# Patient Record
Sex: Female | Born: 1945
Health system: Southern US, Community
[De-identification: ages and names within clinical notes are randomized; demographics above are authoritative.]

## PROBLEM LIST (undated history)

## (undated) DIAGNOSIS — B1920 Unspecified viral hepatitis C without hepatic coma: Secondary | ICD-10-CM

## (undated) DIAGNOSIS — R7303 Prediabetes: Secondary | ICD-10-CM

## (undated) DIAGNOSIS — K259 Gastric ulcer, unspecified as acute or chronic, without hemorrhage or perforation: Secondary | ICD-10-CM

## (undated) DIAGNOSIS — I10 Essential (primary) hypertension: Secondary | ICD-10-CM

## (undated) DIAGNOSIS — E669 Obesity, unspecified: Secondary | ICD-10-CM

## (undated) DIAGNOSIS — E785 Hyperlipidemia, unspecified: Secondary | ICD-10-CM

## (undated) DIAGNOSIS — Z720 Tobacco use: Secondary | ICD-10-CM

## (undated) DIAGNOSIS — E559 Vitamin D deficiency, unspecified: Secondary | ICD-10-CM

## (undated) DIAGNOSIS — M419 Scoliosis, unspecified: Secondary | ICD-10-CM

## (undated) HISTORY — DX: Obesity, unspecified: E66.9

## (undated) HISTORY — DX: Scoliosis, unspecified: M41.9

## (undated) HISTORY — DX: Essential (primary) hypertension: I10

## (undated) HISTORY — PX: CATARACT EXTRACTION: SUR2

## (undated) HISTORY — DX: Vitamin D deficiency, unspecified: E55.9

## (undated) HISTORY — PX: BREAST RECONSTRUCTION: SHX9

## (undated) HISTORY — DX: Unspecified viral hepatitis C without hepatic coma: B19.20

## (undated) HISTORY — DX: Prediabetes: R73.03

## (undated) HISTORY — DX: Hyperlipidemia, unspecified: E78.5

## (undated) HISTORY — DX: Gastric ulcer, unspecified as acute or chronic, without hemorrhage or perforation: K25.9

## (undated) HISTORY — DX: Tobacco use: Z72.0

---

## 1984-03-06 HISTORY — PX: BACK SURGERY: SHX140

## 1997-09-09 ENCOUNTER — Other Ambulatory Visit: Admission: RE | Admit: 1997-09-09 | Discharge: 1997-09-09 | Payer: Self-pay | Admitting: *Deleted

## 2000-01-31 ENCOUNTER — Ambulatory Visit (HOSPITAL_COMMUNITY): Admission: RE | Admit: 2000-01-31 | Discharge: 2000-01-31 | Payer: Self-pay | Admitting: Gastroenterology

## 2001-01-10 ENCOUNTER — Ambulatory Visit (HOSPITAL_COMMUNITY): Admission: RE | Admit: 2001-01-10 | Discharge: 2001-01-10 | Payer: Self-pay | Admitting: Gastroenterology

## 2001-04-03 ENCOUNTER — Other Ambulatory Visit: Admission: RE | Admit: 2001-04-03 | Discharge: 2001-04-03 | Payer: Self-pay | Admitting: Gynecology

## 2002-04-09 ENCOUNTER — Other Ambulatory Visit: Admission: RE | Admit: 2002-04-09 | Discharge: 2002-04-09 | Payer: Self-pay | Admitting: Gynecology

## 2002-09-14 ENCOUNTER — Emergency Department (HOSPITAL_COMMUNITY): Admission: EM | Admit: 2002-09-14 | Discharge: 2002-09-14 | Payer: Self-pay | Admitting: Psychology

## 2005-07-04 ENCOUNTER — Other Ambulatory Visit: Admission: RE | Admit: 2005-07-04 | Discharge: 2005-07-04 | Payer: Self-pay | Admitting: Gynecology

## 2006-04-25 ENCOUNTER — Ambulatory Visit (HOSPITAL_COMMUNITY): Admission: RE | Admit: 2006-04-25 | Discharge: 2006-04-25 | Payer: Self-pay | Admitting: Internal Medicine

## 2010-12-26 ENCOUNTER — Ambulatory Visit (HOSPITAL_COMMUNITY)
Admission: RE | Admit: 2010-12-26 | Discharge: 2010-12-26 | Disposition: A | Discharge status: Home / Self Care | Payer: Medicare Other | Source: Ambulatory Visit | Attending: Internal Medicine | Admitting: Internal Medicine

## 2010-12-26 ENCOUNTER — Other Ambulatory Visit (HOSPITAL_COMMUNITY): Payer: Self-pay | Admitting: Internal Medicine

## 2010-12-26 DIAGNOSIS — Z0000 Encounter for general adult medical examination without abnormal findings: Secondary | ICD-10-CM | POA: Insufficient documentation

## 2010-12-26 DIAGNOSIS — I10 Essential (primary) hypertension: Secondary | ICD-10-CM

## 2011-05-26 ENCOUNTER — Ambulatory Visit (HOSPITAL_COMMUNITY)
Admission: RE | Admit: 2011-05-26 | Discharge: 2011-05-26 | Disposition: A | Discharge status: Home / Self Care | Payer: Medicare Other | Source: Ambulatory Visit | Attending: Internal Medicine | Admitting: Internal Medicine

## 2011-05-26 ENCOUNTER — Other Ambulatory Visit (HOSPITAL_COMMUNITY): Payer: Self-pay | Admitting: Internal Medicine

## 2011-05-26 DIAGNOSIS — M25519 Pain in unspecified shoulder: Secondary | ICD-10-CM

## 2011-08-22 ENCOUNTER — Other Ambulatory Visit (HOSPITAL_COMMUNITY): Payer: Self-pay | Admitting: Internal Medicine

## 2011-08-22 ENCOUNTER — Ambulatory Visit (HOSPITAL_COMMUNITY)
Admission: RE | Admit: 2011-08-22 | Discharge: 2011-08-22 | Disposition: A | Discharge status: Home / Self Care | Payer: Medicare Other | Source: Ambulatory Visit | Attending: Internal Medicine | Admitting: Internal Medicine

## 2011-08-22 DIAGNOSIS — I10 Essential (primary) hypertension: Secondary | ICD-10-CM | POA: Insufficient documentation

## 2011-08-22 DIAGNOSIS — R0602 Shortness of breath: Secondary | ICD-10-CM

## 2011-08-29 ENCOUNTER — Encounter: Payer: Self-pay | Admitting: Cardiovascular Disease

## 2011-08-29 ENCOUNTER — Ambulatory Visit (INDEPENDENT_AMBULATORY_CARE_PROVIDER_SITE_OTHER): Payer: Medicare Other | Admitting: Cardiovascular Disease

## 2011-08-29 VITALS — BP 163/88 | HR 66 | Ht 65.0 in | Wt 222.0 lb

## 2011-08-29 DIAGNOSIS — R0600 Dyspnea, unspecified: Secondary | ICD-10-CM

## 2011-08-29 DIAGNOSIS — R0989 Other specified symptoms and signs involving the circulatory and respiratory systems: Secondary | ICD-10-CM

## 2011-08-29 NOTE — Assessment & Plan Note (Signed)
She has exertional dyspnea. She has no chest pain. Her risk factors for CAD include HTN, HLD, age. Will arrange echo to assess LV size and function and exercise treadmill stress test to assess exercise tolerance and exclude ischemia.

## 2011-08-29 NOTE — Progress Notes (Signed)
   History of Present Illness: 66 yo WF with history of HTN, HLD and former tobacco abuse who is here today for evaluation of dyspnea with exertion. She is active. She has had no chest pain. She is getting dyspneic with exertion such as climbing stairs. Smoked 1ppd for 30 years, quit 6 years ago. No dizziness, near syncope or syncope. No prior cardiac issues.   Primary Care Physician: Dr. Oneta Rack  Past Medical History  Diagnosis Date  . HTN (hypertension)   . Hyperlipidemia   . Scoliosis   . Hepatitis C     Past Surgical History  Procedure Date  . Back surgery 1986    x 3  . Breast reconstruction     x 2  . Cataract extraction     Bilateral    Current Outpatient Prescriptions  Medication Sig Dispense Refill  . bisoprolol-hydrochlorothiazide (ZIAC) 5-6.25 MG per tablet Take 1 tablet by mouth daily.      . Cholecalciferol (VITAMIN D PO) Take by mouth. TAKE 5 000 UNITSDAILY      . ezetimibe (ZETIA) 10 MG tablet Take 10 mg by mouth daily.      . Magnesium Oxide (MAG-CAPS PO) Take by mouth. 400 MG DAILY      . olmesartan-hydrochlorothiazide (BENICAR HCT) 40-25 MG per tablet TAKE 1/2 TAB DAILY      . pravastatin (PRAVACHOL) 40 MG tablet Take 40 mg by mouth daily.        No Known Allergies  History   Social History  . Marital Status: Married    Spouse Name: N/A    Number of Children: 1  . Years of Education: N/A   Occupational History  . Retired [employer]    Social History Main Topics  . Smoking status: Former Smoker -- 1.0 packs/day for 30 years    Types: Cigarettes    Quit date: 08/28/2004  . Smokeless tobacco: Not on file   Comment: QUIT SIX YEARS AGO  . Alcohol Use: 1.0 oz/week    2 drink(s) per week  . Drug Use: No  . Sexually Active: Not on file   Other Topics Concern  . Not on file   Social History Narrative  . No narrative on file    Family History  Problem Relation Age of Onset  . Coronary artery disease Brother     Review of Systems:   As stated in the HPI and otherwise negative.   BP 163/88  Pulse 66  Ht 5\' 5"  (1.651 m)  Wt 222 lb (100.699 kg)  BMI 36.94 kg/m2  Physical Examination: General: Well developed, well nourished, NAD HEENT: OP clear, mucus membranes moist SKIN: warm, dry. No rashes. Neuro: No focal deficits Musculoskeletal: Muscle strength 5/5 all ext Psychiatric: Mood and affect normal Neck: No JVD, no carotid bruits, no thyromegaly, no lymphadenopathy. Lungs:Clear bilaterally, no wheezes, rhonci, crackles Cardiovascular: Regular rate and rhythm. No murmurs, gallops or rubs. Abdomen:Soft. Bowel sounds present. Non-tender.  Extremities: No lower extremity edema. Pulses are 2 + in the bilateral DP/PT.  EKG: Deferred. Pt had normal recent EKG in primary care and does not wish to repeat. Reported as normal in records.

## 2011-08-29 NOTE — Patient Instructions (Signed)
Your physician recommends that you schedule a follow-up appointment in: 6 weeks.   Your physician has requested that you have an exercise tolerance test. For further information please visit [email or web address] Please also follow instruction sheet, as given.   Your physician has requested that you have an echocardiogram. Echocardiography is a painless test that uses sound waves to create images of your heart. It provides your doctor with information about the size and shape of your heart and how well your heart's chambers and valves are working. This procedure takes approximately one hour. There are no restrictions for this procedure.

## 2011-09-05 ENCOUNTER — Ambulatory Visit (HOSPITAL_COMMUNITY): Payer: Medicare Other | Attending: Cardiovascular Disease | Admitting: Radiology

## 2011-09-05 ENCOUNTER — Encounter: Payer: Self-pay | Admitting: Cardiovascular Disease

## 2011-09-05 DIAGNOSIS — R0989 Other specified symptoms and signs involving the circulatory and respiratory systems: Secondary | ICD-10-CM | POA: Insufficient documentation

## 2011-09-05 DIAGNOSIS — R0609 Other forms of dyspnea: Secondary | ICD-10-CM | POA: Insufficient documentation

## 2011-09-05 DIAGNOSIS — Z8249 Family history of ischemic heart disease and other diseases of the circulatory system: Secondary | ICD-10-CM | POA: Insufficient documentation

## 2011-09-05 DIAGNOSIS — I517 Cardiomegaly: Secondary | ICD-10-CM | POA: Insufficient documentation

## 2011-09-05 DIAGNOSIS — I359 Nonrheumatic aortic valve disorder, unspecified: Secondary | ICD-10-CM | POA: Insufficient documentation

## 2011-09-05 DIAGNOSIS — R0600 Dyspnea, unspecified: Secondary | ICD-10-CM

## 2011-09-05 DIAGNOSIS — Z87891 Personal history of nicotine dependence: Secondary | ICD-10-CM | POA: Insufficient documentation

## 2011-09-05 DIAGNOSIS — E669 Obesity, unspecified: Secondary | ICD-10-CM | POA: Insufficient documentation

## 2011-09-05 DIAGNOSIS — E785 Hyperlipidemia, unspecified: Secondary | ICD-10-CM | POA: Insufficient documentation

## 2011-09-05 DIAGNOSIS — I10 Essential (primary) hypertension: Secondary | ICD-10-CM | POA: Insufficient documentation

## 2011-09-05 NOTE — Progress Notes (Signed)
Echocardiogram performed.  

## 2011-09-11 ENCOUNTER — Ambulatory Visit (INDEPENDENT_AMBULATORY_CARE_PROVIDER_SITE_OTHER): Payer: Medicare Other | Admitting: Nurse Practitioner

## 2011-09-11 ENCOUNTER — Encounter: Payer: Self-pay | Admitting: Nurse Practitioner

## 2011-09-11 DIAGNOSIS — R0989 Other specified symptoms and signs involving the circulatory and respiratory systems: Secondary | ICD-10-CM

## 2011-09-11 DIAGNOSIS — R0609 Other forms of dyspnea: Secondary | ICD-10-CM

## 2011-09-11 DIAGNOSIS — R0600 Dyspnea, unspecified: Secondary | ICD-10-CM

## 2011-09-11 NOTE — Procedures (Signed)
Exercise Treadmill Test  Pre-Exercise Testing Evaluation Rhythm: normal sinus  Rate: 84   PR:  .22 QRS:  .09  QT:  .39 QTc: .46     Test  Exercise Tolerance Test Ordering MD: Melene Muller, MD  Interpreting MD: Norma Fredrickson , NP  Unique Test No: 1  Treadmill:  1  Indication for ETT: exertional dyspnea  Contraindication to ETT: No   Stress Modality: exercise - treadmill  Cardiac Imaging Performed: non   Protocol: standard Bruce - maximal  Max BP:  208/94  Max MPHR (bpm):  152 85% MPR (bpm):  129  MPHR obtained (bpm):  142 % MPHR obtained:  93%  Reached 85% MPHR (min:sec):  3:00 Total Exercise Time (min-sec):  5:00  Workload in METS:  7.0 Borg Scale: 15  Reason ETT Terminated:  patient's desire to stop    ST Segment Analysis At Rest: normal ST segments - no evidence of significant ST depression With Exercise: no evidence of significant ST depression  Other Information Arrhythmia:  No Angina during ETT:  absent (0) Quality of ETT:  diagnostic  ETT Interpretation:  normal - no evidence of ischemia by ST analysis  Comments: Patient presents today for routine treadmill testing. She has been complaining of DOE. Was a remote smoker. Has had a basically normal echo with normal EF and grade 1 diastolic dysfunction. She exercised on the standard Bruce protocol for a total of 5 minutes. Reduced and poor exercise tolerance. Target met after 3 minutes of exercise. Hypertensive blood pressure response. Clinically with fatigue and dyspnea. Oxygen sats dropped to 92%. No chest pain reported. EKG was negative for ischemia. No arrhythmia noted.   Recommendations: She appears to be deconditioned. We have discussed the need for a regular exercise program and weight loss. She has follow up planned with Dr. Clifton James. If symptoms persist, further testing and/or pulmonary referral may be needed. Patient is agreeable to this plan and will call if any problems develop in the interim.

## 2011-09-11 NOTE — Patient Instructions (Signed)
Regular exercise is encouraged.  Here are my tips to lose weight:  1. Drink only water. You do not need milk, juice, tea, soda or diet soda.  2. Do not eat anything "white". This includes white bread, potatoes, rice or mayo  3. Stay away from fried foods and sweets  4. Your portion should be the size of the palm of your hand.  5. Know what your weaknesses are and avoid.  6. Find an exercise you like and do it every day for 45 to 60 minutes.

## 2011-09-15 ENCOUNTER — Ambulatory Visit: Payer: Medicare Other | Admitting: Cardiovascular Disease

## 2011-10-16 ENCOUNTER — Encounter: Payer: Self-pay | Admitting: Cardiovascular Disease

## 2011-10-16 ENCOUNTER — Ambulatory Visit (INDEPENDENT_AMBULATORY_CARE_PROVIDER_SITE_OTHER): Payer: Medicare Other | Admitting: Cardiovascular Disease

## 2011-10-16 VITALS — BP 140/86 | HR 66 | Ht 65.0 in | Wt 222.0 lb

## 2011-10-16 DIAGNOSIS — R0989 Other specified symptoms and signs involving the circulatory and respiratory systems: Secondary | ICD-10-CM

## 2011-10-16 DIAGNOSIS — R0609 Other forms of dyspnea: Secondary | ICD-10-CM

## 2011-10-16 DIAGNOSIS — R0600 Dyspnea, unspecified: Secondary | ICD-10-CM

## 2011-10-16 NOTE — Patient Instructions (Addendum)
Your physician wants you to follow-up in:  12 months.  You will receive a reminder letter in the mail two months in advance. If you don't receive a letter, please call our office to schedule the follow-up appointment.   

## 2011-10-16 NOTE — Progress Notes (Signed)
History of Present Illness: 66 yo WF with history of HTN, HLD and former tobacco abuse who is here today for cardiac follow up. I saw her as a new patient 08/29/11 for evaluation of dyspnea with exertion. She is active. She has had no chest pain. She is getting dyspneic with exertion such as climbing stairs. Smoked 1ppd for 30 years, quit 6 years ago. No dizziness, near syncope or syncope. No prior cardiac issues. I arranged a treadmill stress test which showed no evidence of ischemia and an echo 09/05/11 which showed normal LV size and function with mild AI, mild LVH.   She is here today for follow up. She tells me that she is feeling well today. Breathing seems to be better. No chest pain. BP has been good at home.   Primary Care Physician: Dr. Oneta Rack   Past Medical History  Diagnosis Date  . HTN (hypertension)   . Hyperlipidemia   . Scoliosis   . Hepatitis C   . Obesity   . Tobacco use     stopped 6 years ago.    Past Surgical History  Procedure Date  . Back surgery 1986    x 3  . Breast reconstruction     x 2  . Cataract extraction     Bilateral    Current Outpatient Prescriptions  Medication Sig Dispense Refill  . bisoprolol-hydrochlorothiazide (ZIAC) 5-6.25 MG per tablet Take 1 tablet by mouth daily.      . Cholecalciferol (VITAMIN D PO) Take by mouth. TAKE 5 000 UNITSDAILY      . ezetimibe (ZETIA) 10 MG tablet Take 10 mg by mouth daily.      . Magnesium Oxide (MAG-CAPS PO) Take by mouth. 400 MG DAILY      . olmesartan-hydrochlorothiazide (BENICAR HCT) 40-25 MG per tablet TAKE 1/2 TAB DAILY      . pravastatin (PRAVACHOL) 40 MG tablet Take 40 mg by mouth daily.        No Known Allergies  History   Social History  . Marital Status: Married    Spouse Name: N/A    Number of Children: 1  . Years of Education: N/A   Occupational History  . Retired [employer]    Social History Main Topics  . Smoking status: Former Smoker -- 1.0 packs/day for 30 years      Types: Cigarettes    Quit date: 08/28/2004  . Smokeless tobacco: Not on file   Comment: QUIT SIX YEARS AGO  . Alcohol Use: 1.0 oz/week    2 drink(s) per week  . Drug Use: No  . Sexually Active: Not Currently   Other Topics Concern  . Not on file   Social History Narrative  . No narrative on file    Family History  Problem Relation Age of Onset  . Coronary artery disease Brother     Review of Systems:  As stated in the HPI and otherwise negative.   BP 140/86  Pulse 66  Ht 5\' 5"  (1.651 m)  Wt 222 lb (100.699 kg)  BMI 36.94 kg/m2  Physical Examination: General: Well developed, well nourished, NAD HEENT: OP clear, mucus membranes moist SKIN: warm, dry. No rashes. Neuro: No focal deficits Musculoskeletal: Muscle strength 5/5 all ext Psychiatric: Mood and affect normal Neck: No JVD, no carotid bruits, no thyromegaly, no lymphadenopathy. Lungs:Clear bilaterally, no wheezes, rhonci, crackles Cardiovascular: Regular rate and rhythm. No murmurs, gallops or rubs. Abdomen:Soft. Bowel sounds present. Non-tender.  Extremities: No lower  extremity edema. Pulses are 2 + in the bilateral DP/PT.  Echo 09/05/11:  Left ventricle: The cavity size was normal. Wall thickness was increased in a pattern of mild LVH. Systolic function was normal. The estimated ejection fraction was in the range of 60% to 65%. Wall motion was normal; there were no regional wall motion abnormalities. Doppler parameters are consistent with abnormal left ventricular relaxation (grade 1 diastolic dysfunction). - Aortic valve: Mild regurgitation. - Mitral valve: Calcified annulus. - Left atrium: The atrium was mildly dilated.

## 2011-10-16 NOTE — Assessment & Plan Note (Signed)
No EKG changes on stress test to suggest ischemia. Echo with mild LVH but normal LV function and no significant valvular issues. No further workup at this time. Will plan repeat echo to assess mild Aortic valve insufficiency in two years. If she continues to have dyspnea, will need pulmonary workup.

## 2011-11-02 ENCOUNTER — Other Ambulatory Visit: Payer: Self-pay | Admitting: Dermatology

## 2012-05-03 LAB — HM MAMMOGRAPHY: HM MAMMO: NORMAL

## 2012-08-13 ENCOUNTER — Other Ambulatory Visit: Payer: Self-pay | Admitting: Dermatology

## 2013-03-07 ENCOUNTER — Encounter: Payer: Self-pay | Admitting: Internal Medicine

## 2013-03-09 DIAGNOSIS — Z8619 Personal history of other infectious and parasitic diseases: Secondary | ICD-10-CM | POA: Insufficient documentation

## 2013-03-09 DIAGNOSIS — B1920 Unspecified viral hepatitis C without hepatic coma: Secondary | ICD-10-CM | POA: Insufficient documentation

## 2013-03-09 DIAGNOSIS — E559 Vitamin D deficiency, unspecified: Secondary | ICD-10-CM | POA: Insufficient documentation

## 2013-03-09 DIAGNOSIS — E782 Mixed hyperlipidemia: Secondary | ICD-10-CM | POA: Insufficient documentation

## 2013-03-09 DIAGNOSIS — I10 Essential (primary) hypertension: Secondary | ICD-10-CM | POA: Insufficient documentation

## 2013-03-09 DIAGNOSIS — R7309 Other abnormal glucose: Secondary | ICD-10-CM | POA: Insufficient documentation

## 2013-03-09 NOTE — Patient Instructions (Signed)

## 2013-03-09 NOTE — Progress Notes (Signed)
Patient ID: Deanna Gomez, female   DOB: [birth year], 68 y.o.   MRN: [id number]   This very nice 68 y.o. MWF presents for 3 month follow up with Hypertension, Hyperlipidemia, Pre-Diabetes and Vitamin D Deficiency.    HTN predates since 18. BP has been controlled at home. Today's BP: 128/76 mmHg. She had a normal Myoview in July 2013. Patient denies any cardiac type chest pain, palpitations, dyspnea/orthopnea/PND, dizziness, claudication, or dependent edema.   Hyperlipidemia is controlled with diet & meds. Last Cholesterol was 223, Triglycerides were 111, HDL 91 - at goal  and LDL 409 in Sept - slightly elevated and near goal. Patient denies myalgias or other med SE's.    Also, the patient has history of PreDiabetes with A1c 5.7% since June 2012  with last A1c of 5.9% in Sept. Patient denies any symptoms of reactive hypoglycemia, diabetic polys, paresthesias or visual blurring.   Further, Patient has history of Vitamin D Deficiency predating to 2008 with low value of 12  with last vitamin D of 89 in June. Patient supplements vitamin D without any suspected side-effects.  Current Outpatient Prescriptions on File Prior to Visit  Medication Sig Dispense Refill  . aspirin 81 MG tablet Take 81 mg by mouth daily.      . Cholecalciferol (VITAMIN D PO) Take by mouth. TAKE 5 000 UNITSDAILY      . Magnesium Oxide (MAG-CAPS PO) Take by mouth. 400 MG DAILY      . olmesartan-hydrochlorothiazide (BENICAR HCT) 40-25 MG per tablet TAKE 1/2 TAB DAILY       No current facility-administered medications on file prior to visit.     Allergies  Allergen Reactions  . Losartan   . Penicillins Nausea And Vomiting    PMHx:   Past Medical History  Diagnosis Date  . HTN (hypertension)   . Hyperlipidemia   . Scoliosis   . Hepatitis C   . Obesity   . Tobacco use     stopped 6 years ago.  . Pre-diabetes   . Vitamin D deficiency     FHx:    Reviewed / unchanged  SHx:    Reviewed / unchanged  Systems  Review: Constitutional: Denies fever, chills, wt changes, headaches, insomnia, fatigue, night sweats, change in appetite. Eyes: Denies redness, blurred vision, diplopia, discharge, itchy, watery eyes.  ENT: Denies discharge, congestion, post nasal drip, epistaxis, sore throat, earache, hearing loss, dental pain, tinnitus, vertigo, sinus pain, snoring.  CV: Denies chest pain, palpitations, irregular heartbeat, syncope, dyspnea, diaphoresis, orthopnea, PND, claudication, edema. Respiratory: denies cough, dyspnea, DOE, pleurisy, hoarseness, laryngitis, wheezing.  Gastrointestinal: Denies dysphagia, odynophagia, heartburn, reflux, water brash, abdominal pain or cramps, nausea, vomiting, bloating, diarrhea, constipation, hematemesis, melena, hematochezia,  or hemorrhoids. Genitourinary: Denies dysuria, frequency, urgency, nocturia, hesitancy, discharge, hematuria, flank pain. Musculoskeletal: Denies arthralgias, myalgias, stiffness, jt. swelling, pain, limp, strain/sprain.  Skin: Denies pruritus, rash, hives, warts, acne, eczema, change in skin lesion(s). Neuro: No weakness, tremor, incoordination, spasms, paresthesia, or pain. Psychiatric: Denies confusion, memory loss, or sensory loss. Endo: Denies change in weight, skin, hair change.  Heme/Lymph: No excessive bleeding, bruising, orenlarged lymph nodes.  Filed Vitals:   03/10/13 1019  BP: 128/76  Pulse: 68  Temp: 97.5 F (36.4 C)  Resp: 18    Estimated body mass index is 36.31 kg/(m^2) as calculated from the following:   Height as of 10/16/11: 5\' 5"  (1.651 m).   Weight as of this encounter: 218 lb 3.2 oz (98.975 kg).  On Exam:  Appears well nourished - in no distress. Eyes: PERRLA, EOMs, conjunctiva no swelling or erythema. Sinuses: No frontal/maxillary tenderness ENT/Mouth: EAC's clear, TM's nl w/o erythema, bulging. Nares clear w/o erythema, swelling, exudates. Oropharynx clear without erythema or exudates. Oral hygiene is good. Tongue  normal, non obstructing. Hearing intact.  Neck: Supple. Thyroid nl. Car 2+/2+ without bruits, nodes or JVD. Chest: Respirations nl with BS clear & equal w/o rales, rhonchi, wheezing or stridor.  Cor: Heart sounds normal w/ regular rate and rhythm without sig. murmurs, gallops, clicks, or rubs. Peripheral pulses normal and equal  without edema.  Abdomen: Soft & bowel sounds normal. Non-tender w/o guarding, rebound, hernias, masses, or organomegaly.  Lymphatics: Unremarkable.  Musculoskeletal: Full ROM all peripheral extremities, joint stability, 5/5 strength, and normal gait.  Skin: Warm, dry without exposed rashes, lesions, ecchymosis apparent.  Neuro: Cranial nerves intact, reflexes equal bilaterally. Sensory-motor testing grossly intact. Tendon reflexes grossly intact.  Pysch: Alert & oriented x 3. Insight and judgement nl & appropriate. No ideations.  Assessment and Plan:  1. Hypertension - Continue monitor blood pressure at home. Continue diet/meds same.  2. Hyperlipidemia - Continue diet/meds, exercise,& lifestyle modifications. Continue monitor periodic cholesterol/liver & renal functions   3. Pre-diabetes - Continue diet, exercise, lifestyle modifications. Monitor appropriate labs.  4. Vitamin D Deficiency - Continue supplementation.  Recommended regular exercise, BP monitoring, weight control, and discussed med and SE's. Recommended labs to assess and monitor clinical status. Further disposition pending results of labs.

## 2013-03-10 ENCOUNTER — Encounter: Payer: Self-pay | Admitting: Internal Medicine

## 2013-03-10 ENCOUNTER — Ambulatory Visit (INDEPENDENT_AMBULATORY_CARE_PROVIDER_SITE_OTHER): Payer: 59 | Admitting: Internal Medicine

## 2013-03-10 VITALS — BP 128/76 | HR 68 | Temp 97.5°F | Resp 18 | Wt 218.2 lb

## 2013-03-10 DIAGNOSIS — E782 Mixed hyperlipidemia: Secondary | ICD-10-CM

## 2013-03-10 DIAGNOSIS — I10 Essential (primary) hypertension: Secondary | ICD-10-CM

## 2013-03-10 DIAGNOSIS — G4700 Insomnia, unspecified: Secondary | ICD-10-CM

## 2013-03-10 DIAGNOSIS — R7309 Other abnormal glucose: Secondary | ICD-10-CM

## 2013-03-10 DIAGNOSIS — E559 Vitamin D deficiency, unspecified: Secondary | ICD-10-CM

## 2013-03-10 DIAGNOSIS — Z79899 Other long term (current) drug therapy: Secondary | ICD-10-CM

## 2013-03-10 LAB — LIPID PANEL
CHOLESTEROL: 226 mg/dL — AB (ref 0–200)
HDL: 94 mg/dL (ref >39)
LDL CALC: 105 mg/dL — AB (ref 0–99)
Total CHOL/HDL Ratio: 2.4 Ratio
Triglycerides: 135 mg/dL (ref <150)
VLDL: 27 mg/dL (ref 0–40)

## 2013-03-10 LAB — HEMOGLOBIN A1C
HEMOGLOBIN A1C: 5.8 % — AB (ref <5.7)
MEAN PLASMA GLUCOSE: 120 mg/dL — AB (ref <117)

## 2013-03-10 LAB — BASIC METABOLIC PANEL WITH GFR
BUN: 15 mg/dL (ref 6–23)
CALCIUM: 10.7 mg/dL — AB (ref 8.4–10.5)
CO2: 31 meq/L (ref 19–32)
Chloride: 92 mEq/L — ABNORMAL LOW (ref 96–112)
Creat: 0.88 mg/dL (ref 0.50–1.10)
GFR, Est African American: 78 mL/min
GFR, Est Non African American: 67 mL/min
Glucose, Bld: 102 mg/dL — ABNORMAL HIGH (ref 70–99)
Potassium: 4.8 mEq/L (ref 3.5–5.3)
SODIUM: 135 meq/L (ref 135–145)

## 2013-03-10 LAB — HEPATIC FUNCTION PANEL
ALK PHOS: 61 U/L (ref 39–117)
ALT: 32 U/L (ref 0–35)
AST: 38 U/L — ABNORMAL HIGH (ref 0–37)
Albumin: 4.5 g/dL (ref 3.5–5.2)
BILIRUBIN INDIRECT: 0.6 mg/dL (ref 0.0–0.9)
BILIRUBIN TOTAL: 0.8 mg/dL (ref 0.3–1.2)
Bilirubin, Direct: 0.2 mg/dL (ref 0.0–0.3)
Total Protein: 7.6 g/dL (ref 6.0–8.3)

## 2013-03-10 LAB — CBC WITH DIFFERENTIAL/PLATELET
Basophils Absolute: 0 10*3/uL (ref 0.0–0.1)
Basophils Relative: 0 % (ref 0–1)
EOS ABS: 0 10*3/uL (ref 0.0–0.7)
EOS PCT: 0 % (ref 0–5)
HCT: 41.8 % (ref 36.0–46.0)
Hemoglobin: 14.6 g/dL (ref 12.0–15.0)
Lymphocytes Relative: 20 % (ref 12–46)
Lymphs Abs: 1.6 10*3/uL (ref 0.7–4.0)
MCH: 31.9 pg (ref 26.0–34.0)
MCHC: 34.9 g/dL (ref 30.0–36.0)
MCV: 91.3 fL (ref 78.0–100.0)
MONO ABS: 0.9 10*3/uL (ref 0.1–1.0)
Monocytes Relative: 10 % (ref 3–12)
NEUTROS ABS: 5.7 10*3/uL (ref 1.7–7.7)
NEUTROS PCT: 70 % (ref 43–77)
PLATELETS: 264 10*3/uL (ref 150–400)
RBC: 4.58 MIL/uL (ref 3.87–5.11)
RDW: 14 % (ref 11.5–15.5)
WBC: 8.3 10*3/uL (ref 4.0–10.5)

## 2013-03-10 LAB — MAGNESIUM: Magnesium: 1.9 mg/dL (ref 1.5–2.5)

## 2013-03-10 LAB — TSH: TSH: 2.356 u[IU]/mL (ref 0.350–4.500)

## 2013-03-10 MED ORDER — PRAVASTATIN SODIUM 40 MG PO TABS: 40.0000 mg | ORAL_TABLET | Freq: Every day | ORAL | Status: DC

## 2013-03-10 MED ORDER — EZETIMIBE 10 MG PO TABS: 10.0000 mg | ORAL_TABLET | Freq: Every day | ORAL | Status: DC

## 2013-03-10 MED ORDER — BISOPROLOL-HYDROCHLOROTHIAZIDE 5-6.25 MG PO TABS: 1.0000 | ORAL_TABLET | Freq: Every day | ORAL | Status: DC

## 2013-03-10 MED ORDER — ALPRAZOLAM 0.5 MG PO TABS: 0.5000 mg | ORAL_TABLET | Freq: Every evening | ORAL | Status: DC | PRN

## 2013-03-11 ENCOUNTER — Telehealth: Payer: Self-pay | Admitting: *Deleted

## 2013-03-11 LAB — INSULIN, FASTING: INSULIN FASTING, SERUM: 15 u[IU]/mL (ref 3–28)

## 2013-03-11 LAB — VITAMIN D 25 HYDROXY (VIT D DEFICIENCY, FRACTURES): VIT D 25 HYDROXY: 77 ng/mL (ref 30–89)

## 2013-03-11 NOTE — Telephone Encounter (Signed)
Pt aware of lab results 

## 2013-04-08 ENCOUNTER — Other Ambulatory Visit: Payer: Self-pay | Admitting: Internal Medicine

## 2013-04-08 DIAGNOSIS — I10 Essential (primary) hypertension: Secondary | ICD-10-CM

## 2013-04-08 MED ORDER — LOSARTAN POTASSIUM 100 MG PO TABS: 100.0000 mg | ORAL_TABLET | Freq: Every day | ORAL | Status: DC

## 2013-04-11 ENCOUNTER — Other Ambulatory Visit: Payer: Self-pay | Admitting: Emergency Medicine

## 2013-04-11 DIAGNOSIS — I10 Essential (primary) hypertension: Secondary | ICD-10-CM

## 2013-04-11 MED ORDER — LOSARTAN POTASSIUM-HCTZ 100-25 MG PO TABS: 1.0000 | ORAL_TABLET | Freq: Every day | ORAL | Status: DC

## 2013-05-19 ENCOUNTER — Ambulatory Visit: Payer: Self-pay | Admitting: Emergency Medicine

## 2013-05-27 ENCOUNTER — Ambulatory Visit: Payer: Self-pay | Admitting: Emergency Medicine

## 2013-06-03 ENCOUNTER — Encounter: Payer: Self-pay | Admitting: Emergency Medicine

## 2013-06-03 ENCOUNTER — Ambulatory Visit (INDEPENDENT_AMBULATORY_CARE_PROVIDER_SITE_OTHER): Payer: Medicare Other | Admitting: Emergency Medicine

## 2013-06-03 VITALS — BP 138/70 | HR 76 | Temp 98.0°F | Resp 18 | Ht 66.0 in | Wt 218.0 lb

## 2013-06-03 DIAGNOSIS — R7309 Other abnormal glucose: Secondary | ICD-10-CM

## 2013-06-03 DIAGNOSIS — Z0000 Encounter for general adult medical examination without abnormal findings: Secondary | ICD-10-CM

## 2013-06-03 DIAGNOSIS — E782 Mixed hyperlipidemia: Secondary | ICD-10-CM

## 2013-06-03 DIAGNOSIS — E559 Vitamin D deficiency, unspecified: Secondary | ICD-10-CM

## 2013-06-03 DIAGNOSIS — Z789 Other specified health status: Secondary | ICD-10-CM

## 2013-06-03 DIAGNOSIS — Z1331 Encounter for screening for depression: Secondary | ICD-10-CM

## 2013-06-03 DIAGNOSIS — M8580 Other specified disorders of bone density and structure, unspecified site: Secondary | ICD-10-CM

## 2013-06-03 DIAGNOSIS — I10 Essential (primary) hypertension: Secondary | ICD-10-CM

## 2013-06-03 LAB — CBC WITH DIFFERENTIAL/PLATELET
BASOS ABS: 0.1 10*3/uL (ref 0.0–0.1)
Basophils Relative: 1 % (ref 0–1)
EOS ABS: 0.1 10*3/uL (ref 0.0–0.7)
EOS PCT: 1 % (ref 0–5)
HCT: 39.6 % (ref 36.0–46.0)
Hemoglobin: 13.8 g/dL (ref 12.0–15.0)
Lymphocytes Relative: 20 % (ref 12–46)
Lymphs Abs: 1.6 10*3/uL (ref 0.7–4.0)
MCH: 31.9 pg (ref 26.0–34.0)
MCHC: 34.8 g/dL (ref 30.0–36.0)
MCV: 91.7 fL (ref 78.0–100.0)
Monocytes Absolute: 0.7 10*3/uL (ref 0.1–1.0)
Monocytes Relative: 9 % (ref 3–12)
Neutro Abs: 5.4 10*3/uL (ref 1.7–7.7)
Neutrophils Relative %: 69 % (ref 43–77)
PLATELETS: 281 10*3/uL (ref 150–400)
RBC: 4.32 MIL/uL (ref 3.87–5.11)
RDW: 13.5 % (ref 11.5–15.5)
WBC: 7.8 10*3/uL (ref 4.0–10.5)

## 2013-06-03 LAB — HEMOGLOBIN A1C
HEMOGLOBIN A1C: 5.7 % — AB (ref <5.7)
Mean Plasma Glucose: 117 mg/dL — ABNORMAL HIGH (ref <117)

## 2013-06-03 NOTE — Progress Notes (Signed)
Patient ID: Deanna Gomez, female   DOB: [birth year], 68 y.o.   MRN: [id number] Subjective:   Deanna Gomez is a 68 y.o. female who presents for Medicare Annual Wellness Visit and 3 month follow up on hypertension, prediabetes, hyperlipidemia, vitamin D def.  Date of last medicare wellness visit is unknown.   68 yo WF presents for 3 month F/U for HTN, Cholesterol, Pre-Dm, D. Deficient. She is not exercising routinely due to recent vein procedures, since 10/2012. She is eating healthy. She is not going to continue VIT D AD because of recent study of risks with increased dose/ levels.   Her blood pressure has been controlled at home, today their BP is BP: 138/70 mmHg She does not workout. She denies chest pain, shortness of breath, dizziness.  She is on cholesterol medication and denies myalgias. Her cholesterol is not at goal. The cholesterol last visit was:   Lab Results  Component Value Date   CHOL 226* 06/03/2013   HDL 92 06/03/2013   LDLCALC 117* 06/03/2013   TRIG 84 06/03/2013   CHOLHDL 2.5 06/03/2013   She has not been working on diet and exercise for prediabetes, and denies foot ulcerations, polydipsia, polyuria and visual disturbances. Last A1C in the office was:  Lab Results  Component Value Date   HGBA1C 5.7* 06/03/2013   Patient is on Vitamin D supplement.  Names of Other Physician/Practitioners you currently use: Patient Care Team: Lucky Cowboy, MD as PCP - General (Internal Medicine) Davina Poke (Optometry) Griffith Citron, MD as Consulting Physician (Gastroenterology) Janifer Adie, MD as Consulting Physician (Gynecology)  Medication Review Current Outpatient Prescriptions on File Prior to Visit  Medication Sig Dispense Refill  . ALPRAZolam (XANAX) 0.5 MG tablet Take 1 tablet (0.5 mg total) by mouth at bedtime as needed for anxiety.  90 tablet  2  . aspirin 81 MG tablet Take 81 mg by mouth daily.      . bisoprolol-hydrochlorothiazide (ZIAC) 5-6.25 MG per  tablet Take 1 tablet by mouth daily. For BP  90 tablet  99  . ezetimibe (ZETIA) 10 MG tablet Take 1 tablet (10 mg total) by mouth daily. For cholesterol  90 tablet  99  . losartan-hydrochlorothiazide (HYZAAR) 100-25 MG per tablet Take 1 tablet by mouth daily.  90 tablet  1  . Magnesium Oxide (MAG-CAPS PO) Take by mouth. 400 MG DAILY      . pravastatin (PRAVACHOL) 40 MG tablet Take 1 tablet (40 mg total) by mouth daily. For cholesterol  90 tablet  99   No current facility-administered medications on file prior to visit.    Current Problems (verified) Patient Active Problem List   Diagnosis Date Noted  . Unspecified essential hypertension 03/09/2013  . Mixed hyperlipidemia 03/09/2013  . Other abnormal glucose 03/09/2013  . Unspecified vitamin D deficiency 03/09/2013  . Hepatitis C without mention of hepatic coma 03/09/2013    Screening Tests Health Maintenance  Topic Date Due  . Influenza Vaccine  10/04/2013  . Mammogram  05/03/2014  . Tetanus/tdap  03/06/2016  . Colonoscopy  08/29/2019  . Pneumococcal Polysaccharide Vaccine Age 49 And Over  Completed  . Zostavax  Completed     Immunization History  Administered Date(s) Administered  . DTaP 03/06/2006  . Influenza Whole 12/19/2012  . Typhoid Live 03/06/2004  . Zoster 06/11/2007    Preventative care: Last colonoscopy: 08/28/09 Last mammogram: 05/03/12? Last pap smear/pelvic exam: UNSURE AT GYN DEXA:7/ 2013 osteopenia Stress Test: 09/12/2011 CXR 08/2011  Prior vaccinations: TD or Tdap: 2008  Influenza: 2014 Pneumococcal: 2010 Shingles/Zostavax: 2009  History reviewed:  Past Medical History  Diagnosis Date  . HTN (hypertension)   . Hyperlipidemia   . Scoliosis   . Hepatitis C   . Obesity   . Tobacco use     stopped 6 years ago.  . Pre-diabetes   . Vitamin D deficiency    Past Surgical History  Procedure Laterality Date  . Back surgery  1986    x 3  . Breast reconstruction      x 2  . Cataract extraction       Bilateral   History  Substance Use Topics  . Smoking status: Former Smoker -- 1.00 packs/day for 30 years    Types: Cigarettes    Quit date: 08/28/2004  . Smokeless tobacco: Not on file     Comment: QUIT SIX YEARS AGO  . Alcohol Use: 7.0 oz/week    14 drink(s) per week   Family History  Problem Relation Age of Onset  . Coronary artery disease Brother   . Hypertension Mother     Risk Factors: Osteoporosis: postmenopausal estrogen deficiency History of fracture in the past year: no  Tobacco History  Substance Use Topics  . Smoking status: Former Smoker -- 1.00 packs/day for 30 years    Types: Cigarettes    Quit date: 08/28/2004  . Smokeless tobacco: Not on file     Comment: QUIT SIX YEARS AGO  . Alcohol Use: 7.0 oz/week    14 drink(s) per week   She does not smoke.  Patient is a former smoker. Are there smokers in your home (other than you)?  No  Alcohol Current alcohol use: social drinker, glass of wine with dinner  Caffeine Current caffeine use: coffee 1 /day  Exercise Exercise limitations: The patient is experiencing exercise intolerance (difficulty walking 10 feet on flat ground). Current exercise: housecleaning and no regular exercise  Nutrition/Diet Current diet: in general, a "healthy" diet    Cardiac risk factors: advanced age (older than 35 for men, 45 for women), dyslipidemia and hypertension.  Depression Screen Nurse depression screen reviewed.  (Note: if answer to either of the following is "Yes", a more complete depression screening is indicated)   Q1: Over the past two weeks, have you felt down, depressed or hopeless? No  Q2: Over the past two weeks, have you felt little interest or pleasure in doing things? No  Have you lost interest or pleasure in daily life? No  Do you often feel hopeless? No  Do you cry easily over simple problems? No  Activities of Daily Living Nurse ADLs screen reviewed.  In your present state of health, do you  have any difficulty performing the following activities?:  Driving? No Managing money?  No Feeding yourself? No Getting from bed to chair? No Climbing a flight of stairs? No Preparing food and eating?: No Bathing or showering? No Getting dressed: No Getting to the toilet? No Using the toilet:No Moving around from place to place: No In the past year have you fallen or had a near fall?:No   Are you sexually active?  Yes  Do you have more than one partner?  No  Vision Difficulties: No  Hearing Difficulties: No Do you often ask people to speak up or repeat themselves? No Do you experience ringing or noises in your ears? No Do you have difficulty understanding soft or whispered voices? No  Cognition  Do you feel that you  have a problem with memory?No  Do you often misplace items? No  Do you feel safe at home?  Yes  Advanced directives Does patient have a Health Care Power of Attorney? Yes Does patient have a Living Will? Yes, NEED SUPDATED    Objective:     Vision and hearing screens reviewed.   Blood pressure 138/70, pulse 76, temperature 98 F (36.7 C), temperature source Temporal, resp. rate 18, height 5\' 6"  (1.676 m), weight 218 lb (98.884 kg). Body mass index is 35.2 kg/(m^2).  General appearance: alert, no distress, WD/WN,  female Cognitive Testing  Alert? Yes  Normal Appearance?Yes  Oriented to person? Yes  Place? Yes   Time? YES  Recall of three objects?  Yes  Can perform simple calculations? Yes  Displays appropriate judgment?Yes  Can read the correct time from a watch face?Yes  HEENT: normocephalic, sclerae anicteric, TMs pearly, nares patent, no discharge or erythema, pharynx normal Oral cavity: MMM, no lesions Neck: supple, no lymphadenopathy, no thyromegaly, no masses Heart: RRR, normal S1, S2, no murmurs Lungs: CTA bilaterally, no wheezes, rhonchi, or rales Abdomen: +bs, soft, non tender, non distended, no masses, no hepatomegaly, no  splenomegaly Musculoskeletal: nontender, no swelling, no obvious deformity Extremities: no edema, no cyanosis, no clubbing Pulses: 2+ symmetric, upper and lower extremities, normal cap refill Neurological: alert, oriented x 3, CN2-12 intact, strength normal upper extremities and lower extremities, sensation normal throughout, DTRs 2+ throughout, no cerebellar signs, gait normal Psychiatric: normal affect, behavior normal, pleasant  Breast: DEF GYN Gyn:  DEF GYN Rectal: DEF GYN   Assessment:  1. 3 month F/U for HTN, Cholesterol, Pre-Dm, D. Deficient. Needs healthy diet, cardio QD and obtain healthy weight. Check Labs, Check BP if >130/80 call office  2. MEDICARE WELLNESS UPDATE/CPE- Update screening labs/ History/ Immunizations/ Testing as needed. Advised healthy diet, QD exercise, increase H20 and continue RX/ Vitamins AD.  Plan:  Also see above  During the course of the visit the patient was educated and counseled about appropriate screening and preventive services including:    Diabetes screening  Nutrition counseling   Advanced directives: has an advanced directive - a copy HAS NOT been provided.  Screening recommendations, referrals:  Vaccinations: (/up to date ON ALL OF FOLLOWING)   Tdap vaccine no  Influenza vaccine no Pneumococcal vaccine no Shingles vaccine no Hep B vaccine no  Nutrition assessed and recommended  Colonoscopy no Mammogram yes Pap smear yes Pelvic exam yes Recommended yearly ophthalmology/optometry visit for glaucoma screening and checkup YES Recommended yearly dental visit for hygiene and checkup YES Advanced directives - yes  Conditions/risks identified: BMI: Discussed weight loss, diet, and increase physical activity.  Increase physical activity: AHA recommends 150 minutes of physical activity a week.  Medications reviewed DEXA- ordered Diabetes is at goal, ACE/ARB therapy: Yes. Urinary Incontinence is not an issue: discussed non  pharmacology and pharmacology options.  Fall risk: low- discussed PT, home fall assessment, medications.   Medicare Attestation I have personally reviewed: The patient's medical and social history Their use of alcohol, tobacco or illicit drugs Their current medications and supplements The patient's functional ability including ADLs,fall risks, home safety risks, cognitive, and hearing and visual impairment Diet and physical activities Evidence for depression or mood disorders  The patient's weight, height, BMI, and visual acuity have been recorded in the chart.  I have made referrals, counseling, and provided education to the patient based on review of the above and I have provided the patient with a written  personalized care plan for preventive services.     Deanna Gomez, Deanna Gomez, R, PA-C   06/05/2013   CPT [id number] first AWV CPT [id number] subsequent AWV

## 2013-06-03 NOTE — Patient Instructions (Signed)
Fat and Cholesterol Control Diet Fat and cholesterol levels in your blood and organs are influenced by your diet. High levels of fat and cholesterol may lead to diseases of the heart, small and large blood vessels, gallbladder, liver, and pancreas. CONTROLLING FAT AND CHOLESTEROL WITH DIET Although exercise and lifestyle factors are important, your diet is key. That is because certain foods are known to raise cholesterol and others to lower it. The goal is to balance foods for their effect on cholesterol and more importantly, to replace saturated and trans fat with other types of fat, such as monounsaturated fat, polyunsaturated fat, and omega-3 fatty acids. On average, a person should consume no more than 15 to 17 g of saturated fat daily. Saturated and trans fats are considered "bad" fats, and they will raise LDL cholesterol. Saturated fats are primarily found in animal products such as meats, butter, and cream. However, that does not mean you need to give up all your favorite foods. Today, there are good tasting, low-fat, low-cholesterol substitutes for most of the things you like to eat. Choose low-fat or nonfat alternatives. Choose round or loin cuts of red meat. These types of cuts are lowest in fat and cholesterol. Chicken (without the skin), fish, veal, and ground turkey breast are great choices. Eliminate fatty meats, such as hot dogs and salami. Even shellfish have little or no saturated fat. Have a 3 oz (85 g) portion when you eat lean meat, poultry, or fish. Trans fats are also called "partially hydrogenated oils." They are oils that have been scientifically manipulated so that they are solid at room temperature resulting in a longer shelf life and improved taste and texture of foods in which they are added. Trans fats are found in stick margarine, some tub margarines, cookies, crackers, and baked goods.  When baking and cooking, oils are a great substitute for butter. The monounsaturated oils are  especially beneficial since it is believed they lower LDL and raise HDL. The oils you should avoid entirely are saturated tropical oils, such as coconut and palm.  Remember to eat a lot from food groups that are naturally free of saturated and trans fat, including fish, fruit, vegetables, beans, grains (barley, rice, couscous, bulgur wheat), and pasta (without cream sauces).  IDENTIFYING FOODS THAT LOWER FAT AND CHOLESTEROL  Soluble fiber may lower your cholesterol. This type of fiber is found in fruits such as apples, vegetables such as broccoli, potatoes, and carrots, legumes such as beans, peas, and lentils, and grains such as barley. Foods fortified with plant sterols (phytosterol) may also lower cholesterol. You should eat at least 2 g per day of these foods for a cholesterol lowering effect.  Read package labels to identify low-saturated fats, trans fat free, and low-fat foods at the supermarket. Select cheeses that have only 2 to 3 g saturated fat per ounce. Use a heart-healthy tub margarine that is free of trans fats or partially hydrogenated oil. When buying baked goods (cookies, crackers), avoid partially hydrogenated oils. Breads and muffins should be made from whole grains (whole-wheat or whole oat flour, instead of "flour" or "enriched flour"). Buy non-creamy canned soups with reduced salt and no added fats.  FOOD PREPARATION TECHNIQUES  Never deep-fry. If you must fry, either stir-fry, which uses very little fat, or use non-stick cooking sprays. When possible, broil, bake, or roast meats, and steam vegetables. Instead of putting butter or margarine on vegetables, use lemon and herbs, applesauce, and cinnamon (for squash and sweet potatoes). Use nonfat   yogurt, salsa, and low-fat dressings for salads.  LOW-SATURATED FAT / LOW-FAT FOOD SUBSTITUTES Meats / Saturated Fat (g)  Avoid: Steak, marbled (3 oz/85 g) / 11 g  Choose: Steak, lean (3 oz/85 g) / 4 g  Avoid: Hamburger (3 oz/85 g) / 7  g  Choose: Hamburger, lean (3 oz/85 g) / 5 g  Avoid: Ham (3 oz/85 g) / 6 g  Choose: Ham, lean cut (3 oz/85 g) / 2.4 g  Avoid: Chicken, with skin, dark meat (3 oz/85 g) / 4 g  Choose: Chicken, skin removed, dark meat (3 oz/85 g) / 2 g  Avoid: Chicken, with skin, light meat (3 oz/85 g) / 2.5 g  Choose: Chicken, skin removed, light meat (3 oz/85 g) / 1 g Dairy / Saturated Fat (g)  Avoid: Whole milk (1 cup) / 5 g  Choose: Low-fat milk, 2% (1 cup) / 3 g  Choose: Low-fat milk, 1% (1 cup) / 1.5 g  Choose: Skim milk (1 cup) / 0.3 g  Avoid: Hard cheese (1 oz/28 g) / 6 g  Choose: Skim milk cheese (1 oz/28 g) / 2 to 3 g  Avoid: Cottage cheese, 4% fat (1 cup) / 6.5 g  Choose: Low-fat cottage cheese, 1% fat (1 cup) / 1.5 g  Avoid: Ice cream (1 cup) / 9 g  Choose: Sherbet (1 cup) / 2.5 g  Choose: Nonfat frozen yogurt (1 cup) / 0.3 g  Choose: Frozen fruit bar / trace  Avoid: Whipped cream (1 tbs) / 3.5 g  Choose: Nondairy whipped topping (1 tbs) / 1 g Condiments / Saturated Fat (g)  Avoid: Mayonnaise (1 tbs) / 2 g  Choose: Low-fat mayonnaise (1 tbs) / 1 g  Avoid: Butter (1 tbs) / 7 g  Choose: Extra light margarine (1 tbs) / 1 g  Avoid: Coconut oil (1 tbs) / 11.8 g  Choose: Olive oil (1 tbs) / 1.8 g  Choose: Corn oil (1 tbs) / 1.7 g  Choose: Safflower oil (1 tbs) / 1.2 g  Choose: Sunflower oil (1 tbs) / 1.4 g  Choose: Soybean oil (1 tbs) / 2.4 g  Choose: Canola oil (1 tbs) / 1 g Document Released: 02/20/2005 Document Revised: 06/17/2012 Document Reviewed: 08/11/2010 Exit[employer] Patient Information 2014 [employer] [place]. Vitamin D Deficiency  Not having enough vitamin D is called a deficiency. Your body needs this vitamin to keep your bones strong and healthy. Having too little of it can make your bones soft or can cause other health problems.  HOME CARE  Take all vitamins, herbs, or nutrition drinks (supplements) as told by your doctor.  Have your blood tested 2  months after taking vitamins, herbs, or nutrition drinks.  Eat foods that have vitamin D. This includes:  Dairy products, cereals, or juices with added vitamin D. Check the label.  Fatty fish like salmon or trout.  Eggs.  Oysters.  Do not use tanning beds.  Stay at a healthy weight. Lose weight if needed.  Keep all doctor visits as told. GET HELP IF:  You have questions.  You continue to have problems.  You feel sick to your stomach (nauseous) or throw up (vomit).  You cannot go poop (constipated).  You feel confused.  You have severe belly (abdominal) or back pain. MAKE SURE YOU:  Understand these instructions.  Will watch your condition.  Will get help right away if you are not doing well or get worse. Document Released: 02/09/2011 Document Revised: 06/17/2012 Document Reviewed: 02/09/2011  Exit[employer] Patient Information 2014 [employer].  

## 2013-06-04 LAB — LIPID PANEL
CHOL/HDL RATIO: 2.5 ratio
Cholesterol: 226 mg/dL — ABNORMAL HIGH (ref 0–200)
HDL: 92 mg/dL (ref >39)
LDL Cholesterol: 117 mg/dL — ABNORMAL HIGH (ref 0–99)
TRIGLYCERIDES: 84 mg/dL (ref <150)
VLDL: 17 mg/dL (ref 0–40)

## 2013-06-04 LAB — HEPATIC FUNCTION PANEL
ALBUMIN: 4.6 g/dL (ref 3.5–5.2)
ALK PHOS: 52 U/L (ref 39–117)
ALT: 23 U/L (ref 0–35)
AST: 28 U/L (ref 0–37)
Bilirubin, Direct: 0.2 mg/dL (ref 0.0–0.3)
Indirect Bilirubin: 0.6 mg/dL (ref 0.2–1.2)
TOTAL PROTEIN: 7.5 g/dL (ref 6.0–8.3)
Total Bilirubin: 0.8 mg/dL (ref 0.2–1.2)

## 2013-06-04 LAB — BASIC METABOLIC PANEL WITH GFR
BUN: 18 mg/dL (ref 6–23)
CHLORIDE: 91 meq/L — AB (ref 96–112)
CO2: 31 mEq/L (ref 19–32)
Calcium: 10.5 mg/dL (ref 8.4–10.5)
Creat: 0.81 mg/dL (ref 0.50–1.10)
GFR, EST AFRICAN AMERICAN: 85 mL/min
GFR, EST NON AFRICAN AMERICAN: 74 mL/min
GLUCOSE: 102 mg/dL — AB (ref 70–99)
POTASSIUM: 5.1 meq/L (ref 3.5–5.3)
SODIUM: 131 meq/L — AB (ref 135–145)

## 2013-07-07 ENCOUNTER — Ambulatory Visit: Payer: Self-pay

## 2013-07-07 ENCOUNTER — Encounter: Payer: Self-pay | Admitting: Physician Assistant

## 2013-07-07 ENCOUNTER — Ambulatory Visit (INDEPENDENT_AMBULATORY_CARE_PROVIDER_SITE_OTHER): Payer: Medicare Other | Admitting: Physician Assistant

## 2013-07-07 ENCOUNTER — Other Ambulatory Visit: Payer: Self-pay

## 2013-07-07 VITALS — BP 128/68 | HR 76 | Temp 98.1°F | Resp 16 | Wt 218.0 lb

## 2013-07-07 DIAGNOSIS — R05 Cough: Secondary | ICD-10-CM

## 2013-07-07 DIAGNOSIS — R899 Unspecified abnormal finding in specimens from other organs, systems and tissues: Secondary | ICD-10-CM

## 2013-07-07 DIAGNOSIS — J329 Chronic sinusitis, unspecified: Secondary | ICD-10-CM

## 2013-07-07 DIAGNOSIS — R059 Cough, unspecified: Secondary | ICD-10-CM

## 2013-07-07 DIAGNOSIS — R6889 Other general symptoms and signs: Secondary | ICD-10-CM

## 2013-07-07 MED ORDER — AZITHROMYCIN 250 MG PO TABS: ORAL_TABLET | ORAL | Status: DC

## 2013-07-07 MED ORDER — PROMETHAZINE-CODEINE 6.25-10 MG/5ML PO SYRP: 5.0000 mL | ORAL_SOLUTION | Freq: Four times a day (QID) | ORAL | Status: DC | PRN

## 2013-07-07 NOTE — Patient Instructions (Signed)

## 2013-07-07 NOTE — Progress Notes (Signed)
   Subjective:    Patient ID: Deanna CowmanSuzanne B Lagan, female    DOB: Sep 22, 1945, 68 y.o.   MRN: [id number]  Cough This is a new problem. Episode onset: 1-2 weeks. The problem has been gradually worsening. The problem occurs constantly. The cough is productive of purulent sputum. Associated symptoms include chills, headaches, nasal congestion, postnasal drip, rhinorrhea and a sore throat. Pertinent negatives include no chest pain, ear congestion, ear pain, fever, heartburn, hemoptysis, myalgias, rash, shortness of breath, sweats, weight loss or wheezing. The symptoms are aggravated by lying down. Risk factors for lung disease include travel. She has tried nothing for the symptoms. The treatment provided no relief.   In addition she had recent labs, we are here to recheck kidney function.  Lab Results  Component Value Date   CREATININE 0.81 06/03/2013   BUN 18 06/03/2013   NA 131* 06/03/2013   K 5.1 06/03/2013   CL 91* 06/03/2013   CO2 31 06/03/2013    Review of Systems  Constitutional: Positive for chills. Negative for fever, weight loss and diaphoresis.  HENT: Positive for congestion, postnasal drip, rhinorrhea, sinus pressure, sneezing and sore throat. Negative for ear pain.   Respiratory: Positive for cough. Negative for hemoptysis, chest tightness, shortness of breath and wheezing.   Cardiovascular: Negative.  Negative for chest pain.  Gastrointestinal: Negative.  Negative for heartburn.  Genitourinary: Negative.   Musculoskeletal: Negative for myalgias and neck pain.  Skin: Negative for rash.  Neurological: Positive for headaches.       Objective:   Physical Exam  Constitutional: She appears well-developed and well-nourished.  HENT:  Head: Normocephalic and atraumatic.  Right Ear: External ear normal.  Nose: Right sinus exhibits maxillary sinus tenderness. Right sinus exhibits no frontal sinus tenderness. Left sinus exhibits maxillary sinus tenderness. Left sinus exhibits no frontal  sinus tenderness.  Eyes: Conjunctivae and EOM are normal.  Neck: Normal range of motion. Neck supple.  Cardiovascular: Normal rate, regular rhythm, normal heart sounds and intact distal pulses.   Pulmonary/Chest: Effort normal and breath sounds normal. No respiratory distress. She has no wheezes.  Abdominal: Soft. Bowel sounds are normal.  Lymphadenopathy:    She has cervical adenopathy.  Skin: Skin is warm and dry.      Assessment & Plan:  1. Cough/Sinusitis -Zpak, codeine cough syrup, if it gets worse go to the ER, if it does not get better follow up in the office for a CXR.    3. Abnormal laboratory test - BASIC METABOLIC PANEL WITH GFR

## 2013-07-08 LAB — BASIC METABOLIC PANEL WITH GFR
BUN: 13 mg/dL (ref 6–23)
CALCIUM: 10.2 mg/dL (ref 8.4–10.5)
CO2: 29 mEq/L (ref 19–32)
Chloride: 91 mEq/L — ABNORMAL LOW (ref 96–112)
Creat: 0.69 mg/dL (ref 0.50–1.10)
GFR, EST NON AFRICAN AMERICAN: 88 mL/min
GFR, Est African American: >89 mL/min
Glucose, Bld: 94 mg/dL (ref 70–99)
Potassium: 4.1 mEq/L (ref 3.5–5.3)
Sodium: 133 mEq/L — ABNORMAL LOW (ref 135–145)

## 2013-09-04 ENCOUNTER — Encounter: Payer: Self-pay | Admitting: Internal Medicine

## 2013-10-02 ENCOUNTER — Other Ambulatory Visit: Payer: Self-pay | Admitting: Emergency Medicine

## 2013-10-13 ENCOUNTER — Encounter: Payer: Self-pay | Admitting: Internal Medicine

## 2013-10-13 ENCOUNTER — Ambulatory Visit (INDEPENDENT_AMBULATORY_CARE_PROVIDER_SITE_OTHER): Payer: Medicare Other | Admitting: Internal Medicine

## 2013-10-13 VITALS — BP 158/88 | HR 76 | Temp 97.5°F | Resp 16 | Ht 65.75 in | Wt 222.0 lb

## 2013-10-13 DIAGNOSIS — G4700 Insomnia, unspecified: Secondary | ICD-10-CM

## 2013-10-13 DIAGNOSIS — Z1331 Encounter for screening for depression: Secondary | ICD-10-CM

## 2013-10-13 DIAGNOSIS — Z1212 Encounter for screening for malignant neoplasm of rectum: Secondary | ICD-10-CM

## 2013-10-13 DIAGNOSIS — E782 Mixed hyperlipidemia: Secondary | ICD-10-CM

## 2013-10-13 DIAGNOSIS — R7309 Other abnormal glucose: Secondary | ICD-10-CM

## 2013-10-13 DIAGNOSIS — Z789 Other specified health status: Secondary | ICD-10-CM

## 2013-10-13 DIAGNOSIS — Z79899 Other long term (current) drug therapy: Secondary | ICD-10-CM

## 2013-10-13 DIAGNOSIS — Z0000 Encounter for general adult medical examination without abnormal findings: Secondary | ICD-10-CM

## 2013-10-13 DIAGNOSIS — I10 Essential (primary) hypertension: Secondary | ICD-10-CM

## 2013-10-13 DIAGNOSIS — E559 Vitamin D deficiency, unspecified: Secondary | ICD-10-CM

## 2013-10-13 LAB — CBC WITH DIFFERENTIAL/PLATELET
BASOS ABS: 0 10*3/uL (ref 0.0–0.1)
BASOS PCT: 0 % (ref 0–1)
EOS ABS: 0.1 10*3/uL (ref 0.0–0.7)
Eosinophils Relative: 1 % (ref 0–5)
HEMATOCRIT: 39.1 % (ref 36.0–46.0)
Hemoglobin: 13.9 g/dL (ref 12.0–15.0)
Lymphocytes Relative: 23 % (ref 12–46)
Lymphs Abs: 1.6 10*3/uL (ref 0.7–4.0)
MCH: 32.3 pg (ref 26.0–34.0)
MCHC: 35.5 g/dL (ref 30.0–36.0)
MCV: 90.7 fL (ref 78.0–100.0)
MONO ABS: 0.6 10*3/uL (ref 0.1–1.0)
Monocytes Relative: 8 % (ref 3–12)
Neutro Abs: 4.8 10*3/uL (ref 1.7–7.7)
Neutrophils Relative %: 68 % (ref 43–77)
Platelets: 264 10*3/uL (ref 150–400)
RBC: 4.31 MIL/uL (ref 3.87–5.11)
RDW: 13.5 % (ref 11.5–15.5)
WBC: 7.1 10*3/uL (ref 4.0–10.5)

## 2013-10-13 LAB — HEPATIC FUNCTION PANEL
ALBUMIN: 4.7 g/dL (ref 3.5–5.2)
ALT: 25 U/L (ref 0–35)
AST: 31 U/L (ref 0–37)
Alkaline Phosphatase: 47 U/L (ref 39–117)
BILIRUBIN TOTAL: 0.7 mg/dL (ref 0.2–1.2)
Bilirubin, Direct: 0.1 mg/dL (ref 0.0–0.3)
Indirect Bilirubin: 0.6 mg/dL (ref 0.2–1.2)
Total Protein: 7.3 g/dL (ref 6.0–8.3)

## 2013-10-13 LAB — BASIC METABOLIC PANEL WITH GFR
BUN: 11 mg/dL (ref 6–23)
CALCIUM: 9.6 mg/dL (ref 8.4–10.5)
CO2: 30 mEq/L (ref 19–32)
Chloride: 95 mEq/L — ABNORMAL LOW (ref 96–112)
Creat: 0.83 mg/dL (ref 0.50–1.10)
GFR, EST NON AFRICAN AMERICAN: 72 mL/min
GFR, Est African American: 83 mL/min
Glucose, Bld: 105 mg/dL — ABNORMAL HIGH (ref 70–99)
Potassium: 4.4 mEq/L (ref 3.5–5.3)
Sodium: 136 mEq/L (ref 135–145)

## 2013-10-13 LAB — MAGNESIUM: Magnesium: 1.3 mg/dL — ABNORMAL LOW (ref 1.5–2.5)

## 2013-10-13 LAB — LIPID PANEL
Cholesterol: 206 mg/dL — ABNORMAL HIGH (ref 0–200)
HDL: 85 mg/dL (ref >39)
LDL Cholesterol: 101 mg/dL — ABNORMAL HIGH (ref 0–99)
TRIGLYCERIDES: 100 mg/dL (ref <150)
Total CHOL/HDL Ratio: 2.4 Ratio
VLDL: 20 mg/dL (ref 0–40)

## 2013-10-13 LAB — MICROALBUMIN / CREATININE URINE RATIO
Creatinine, Urine: 88 mg/dL
MICROALB/CREAT RATIO: 413.1 mg/g — AB (ref 0.0–30.0)
Microalb, Ur: 36.35 mg/dL — ABNORMAL HIGH (ref 0.00–1.89)

## 2013-10-13 LAB — TSH: TSH: 2.011 u[IU]/mL (ref 0.350–4.500)

## 2013-10-13 LAB — HEMOGLOBIN A1C
Hgb A1c MFr Bld: 5.6 % (ref <5.7)
Mean Plasma Glucose: 114 mg/dL (ref <117)

## 2013-10-13 MED ORDER — ALPRAZOLAM 0.5 MG PO TABS: ORAL_TABLET | ORAL | Status: DC

## 2013-10-13 NOTE — Patient Instructions (Signed)

## 2013-10-13 NOTE — Progress Notes (Signed)
Patient ID: Deanna Gomez, female   DOB: 04/06/1945, 68 y.o.   MRN: [id number]   Annual Screening Comprehensive Examination  This very nice 68 y.o.MWF presents for complete physical.  Patient has been followed for HTN,  Prediabetes, Hyperlipidemia, and Vitamin D Deficiency.    HTN predates since 1996. Patient's BP has been controlled at home and patient denies any cardiac symptoms as chest pain, palpitations, shortness of breath, dizziness or ankle swelling. Today's BP: 158/88 mmHg. In 09/2011, patient had a negative Myoview.   Patient's hyperlipidemia is not at goal with diet and medications. Patient denies myalgias or other medication SE's. Last lipids were 06/03/2013: Cholesterol226*; HDL  92; LDL 117*; Triglycerides 84 .  Patient has prediabetes predating since 2012 (A1c 5.7%) and 2013 (A1c 5.9%)  and patient denies reactive hypoglycemic symptoms, visual blurring, diabetic polys, or paresthesias. Last A1c was 5.7% on 06/03/2013.    Finally, patient has history of Vitamin D Deficiency of 12 in 2008 and last Vitamin D was 03/10/2013: Vit D=77  Medication Sig  . aspirin 81 MG tab Take 81 mg  daily.  . bisoprolol-hctz) 5-6.25 MG Take 1 tablet  daily  . ezetimibe (ZETIA) 10 MG tab Take 1 tablet daily.   Marland Kitchen losartan-hctz 100-25 MG per tab TAKE 1 TABLET  DAILY.  . Magnesium Oxide 400 Mg Take 1 Tab Daily  . pravastatin 40 MG tab Take 1 tablet  daily   No Known Allergies  Past Medical History  Diagnosis Date  . HTN (hypertension)   . Hyperlipidemia   . Scoliosis   . Hepatitis C (1980)  Treated to Remission in 1987   . Obesity   . Tobacco use     stopped 6 years ago.  . Pre-diabetes   . Vitamin D deficiency    Past Surgical History  Procedure Laterality Date  . Back surgery  1986    x 3  . Breast reconstruction      x 2  . Cataract extraction      Bilateral   Family History  Problem Relation Age of Onset  . Coronary artery disease Brother   . Hypertension Mother    History   Substance Use Topics  . Smoking status: Former Smoker -- 1.00 packs/day for 30 years    Types: Cigarettes    Quit date: 08/28/2004  . Smokeless tobacco: Not on file     Comment: QUIT SIX YEARS AGO  . Alcohol Use: 7.0 oz/week    14 drink(s) per week    ROS Constitutional: Denies fever, chills, weight loss/gain, headaches, insomnia, fatigue, night sweats, and change in appetite. Eyes: Denies redness, blurred vision, diplopia, discharge, itchy, watery eyes.  ENT: Denies discharge, congestion, post nasal drip, epistaxis, sore throat, earache, hearing loss, dental pain, Tinnitus, Vertigo, Sinus pain, snoring.  Cardio: Denies chest pain, palpitations, irregular heartbeat, syncope, dyspnea, diaphoresis, orthopnea, PND, claudication, edema Respiratory: denies cough, dyspnea, DOE, pleurisy, hoarseness, laryngitis, wheezing.  Gastrointestinal: Denies dysphagia, heartburn, reflux, water brash, pain, cramps, nausea, vomiting, bloating, diarrhea, constipation, hematemesis, melena, hematochezia, jaundice, hemorrhoids Genitourinary: Denies dysuria, frequency, urgency, nocturia, hesitancy, discharge, hematuria, flank pain Breast: Rt breast implant w/faux nipple tattoos. Breast lumps, nipple discharge, bleeding.  Musculoskeletal: Denies arthralgia, myalgia, stiffness, Jt. Swelling, pain, limp, and strain/sprain. Denies falls. Skin: Denies puritis, rash, hives, warts, acne, eczema, changing in skin lesion Neuro: No weakness, tremor, incoordination, spasms, paresthesia, pain Psychiatric: Denies confusion, memory loss, sensory loss. Denies Depression. Endocrine: Denies change in weight, skin, hair change, nocturia, and  paresthesia, diabetic polys, visual blurring, hyper / hypo glycemic episodes.  Heme/Lymph: No excessive bleeding, bruising, enlarged lymph nodes.  Physical Exam  BP 158/88  Pulse 76  Temp(Src) 97.5 F (36.4 C) (Temporal)  Resp 16  Ht 5' 5.75" (1.67 m)  Wt 222 lb (100.699 kg)  BMI 36.11  kg/m2  General Appearance: Well nourished and in no apparent distress. Eyes: PERRLA, EOMs, conjunctiva no swelling or erythema, normal fundi and vessels. Sinuses: No frontal/maxillary tenderness ENT/Mouth: EACs patent / TMs  nl. Nares clear without erythema, swelling, mucoid exudates. Oral hygiene is good. No erythema, swelling, or exudate. Tongue normal, non-obstructing. Tonsils not swollen or erythematous. Hearing normal.  Neck: Supple, thyroid normal. No bruits, nodes or JVD. Respiratory: Respiratory effort normal.  BS equal and clear bilateral without rales, rhonci, wheezing or stridor. Cardio: Heart sounds are normal with regular rate and rhythm and no murmurs, rubs or gallops. Peripheral pulses are normal and equal bilaterally without edema. No aortic or femoral bruits. Chest: symmetric with normal excursions and percussion. Breasts: Symmetric, without lumps, nipple discharge, retractions, or fibrocystic changes.  Abdomen: Flat, soft, with bowl sounds. Nontender, no guarding, rebound, hernias, masses, or organomegaly.  Lymphatics: Non tender without lymphadenopathy.  Genitourinary:  Musculoskeletal: Full ROM all peripheral extremities, joint stability, 5/5 strength, and normal gait. Skin: Warm and dry without rashes, lesions, cyanosis, clubbing or  ecchymosis.  Neuro: Cranial nerves intact, reflexes equal bilaterally. Normal muscle tone, no cerebellar symptoms. Sensation intact.  Pysch: Awake and oriented X 3, normal affect, Insight and Judgment appropriate.   Assessment and Plan  1. Annual Screening Examination 2. Hypertension  3. Hyperlipidemia 4. Pre Diabetes 5. Vitamin D Deficiency  Continue prudent diet as discussed, weight control, BP monitoring, regular exercise, and medications. Discussed med's effects and SE's. Screening labs and tests as requested with regular follow-up as recommended.

## 2013-10-14 LAB — URINALYSIS, MICROSCOPIC ONLY
Bacteria, UA: NONE SEEN
Casts: NONE SEEN
Crystals: NONE SEEN

## 2013-10-14 LAB — INSULIN, FASTING: INSULIN FASTING, SERUM: 26 u[IU]/mL (ref 3–28)

## 2013-10-14 LAB — VITAMIN D 25 HYDROXY (VIT D DEFICIENCY, FRACTURES): Vit D, 25-Hydroxy: 62 ng/mL (ref 30–89)

## 2014-01-09 ENCOUNTER — Other Ambulatory Visit: Payer: Self-pay | Admitting: Internal Medicine

## 2014-01-15 ENCOUNTER — Ambulatory Visit (INDEPENDENT_AMBULATORY_CARE_PROVIDER_SITE_OTHER): Payer: 59 | Admitting: Physician Assistant

## 2014-01-15 ENCOUNTER — Encounter: Payer: Self-pay | Admitting: Physician Assistant

## 2014-01-15 VITALS — BP 128/62 | HR 68 | Temp 97.7°F | Resp 16 | Wt 216.0 lb

## 2014-01-15 DIAGNOSIS — I10 Essential (primary) hypertension: Secondary | ICD-10-CM

## 2014-01-15 DIAGNOSIS — E559 Vitamin D deficiency, unspecified: Secondary | ICD-10-CM

## 2014-01-15 DIAGNOSIS — E782 Mixed hyperlipidemia: Secondary | ICD-10-CM

## 2014-01-15 DIAGNOSIS — Z79899 Other long term (current) drug therapy: Secondary | ICD-10-CM

## 2014-01-15 DIAGNOSIS — B1920 Unspecified viral hepatitis C without hepatic coma: Secondary | ICD-10-CM

## 2014-01-15 DIAGNOSIS — R7303 Prediabetes: Secondary | ICD-10-CM

## 2014-01-15 DIAGNOSIS — R7309 Other abnormal glucose: Secondary | ICD-10-CM

## 2014-01-15 LAB — HEPATIC FUNCTION PANEL
ALT: 25 U/L (ref 0–35)
AST: 30 U/L (ref 0–37)
Albumin: 4.7 g/dL (ref 3.5–5.2)
Alkaline Phosphatase: 52 U/L (ref 39–117)
Bilirubin, Direct: 0.2 mg/dL (ref 0.0–0.3)
Indirect Bilirubin: 0.7 mg/dL (ref 0.2–1.2)
Total Bilirubin: 0.9 mg/dL (ref 0.2–1.2)
Total Protein: 7.7 g/dL (ref 6.0–8.3)

## 2014-01-15 LAB — LIPID PANEL
CHOL/HDL RATIO: 2.1 ratio
Cholesterol: 216 mg/dL — ABNORMAL HIGH (ref 0–200)
HDL: 102 mg/dL (ref >39)
LDL Cholesterol: 99 mg/dL (ref 0–99)
Triglycerides: 73 mg/dL (ref <150)
VLDL: 15 mg/dL (ref 0–40)

## 2014-01-15 LAB — BASIC METABOLIC PANEL WITH GFR
BUN: 17 mg/dL (ref 6–23)
CO2: 28 mEq/L (ref 19–32)
Calcium: 10.2 mg/dL (ref 8.4–10.5)
Chloride: 87 mEq/L — ABNORMAL LOW (ref 96–112)
Creat: 0.96 mg/dL (ref 0.50–1.10)
GFR, EST NON AFRICAN AMERICAN: 60 mL/min
GFR, Est African American: 69 mL/min
Glucose, Bld: 107 mg/dL — ABNORMAL HIGH (ref 70–99)
POTASSIUM: 4.5 meq/L (ref 3.5–5.3)
Sodium: 128 mEq/L — ABNORMAL LOW (ref 135–145)

## 2014-01-15 LAB — CBC WITH DIFFERENTIAL/PLATELET
BASOS ABS: 0 10*3/uL (ref 0.0–0.1)
Basophils Relative: 0 % (ref 0–1)
Eosinophils Absolute: 0.1 10*3/uL (ref 0.0–0.7)
Eosinophils Relative: 1 % (ref 0–5)
HEMATOCRIT: 40.8 % (ref 36.0–46.0)
Hemoglobin: 14.1 g/dL (ref 12.0–15.0)
LYMPHS ABS: 1.3 10*3/uL (ref 0.7–4.0)
LYMPHS PCT: 17 % (ref 12–46)
MCH: 31.8 pg (ref 26.0–34.0)
MCHC: 34.6 g/dL (ref 30.0–36.0)
MCV: 91.9 fL (ref 78.0–100.0)
MONO ABS: 0.7 10*3/uL (ref 0.1–1.0)
Monocytes Relative: 9 % (ref 3–12)
NEUTROS ABS: 5.8 10*3/uL (ref 1.7–7.7)
Neutrophils Relative %: 73 % (ref 43–77)
Platelets: 286 10*3/uL (ref 150–400)
RBC: 4.44 MIL/uL (ref 3.87–5.11)
RDW: 13.3 % (ref 11.5–15.5)
WBC: 7.9 10*3/uL (ref 4.0–10.5)

## 2014-01-15 LAB — MAGNESIUM: Magnesium: 1.5 mg/dL (ref 1.5–2.5)

## 2014-01-15 LAB — TSH: TSH: 2.914 u[IU]/mL (ref 0.350–4.500)

## 2014-01-15 NOTE — Patient Instructions (Signed)
    Bad carbs also include fruit juice, alcohol, and sweet tea. These are empty calories that do not signal to your brain that you are full.   Please remember the good carbs are still carbs which convert into sugar. So please measure them out no more than 1/2-1 cup of rice, oatmeal, pasta, and beans  Veggies are however free foods! Pile them on.   Not all fruit is created equal. Please see the list below, the fruit at the bottom is higher in sugars than the fruit at the top. Please avoid all dried fruits.    Benefiber is good for constipation/diarrhea/irritable bowel syndrome, it helps with weight loss and can help lower your bad cholesterol. Please do 1-2 TBSP in the morning in water, coffee, or tea. It can take up to a month before you can see a difference with your bowel movements. It is cheapest from [employer], [employer], [employer].   

## 2014-01-15 NOTE — Progress Notes (Signed)
Assessment and Plan:  Hypertension: Continue medication, monitor blood pressure at home. Continue DASH diet.  Reminder to go to the ER if any CP, SOB, nausea, dizziness, severe HA, changes vision/speech, left arm numbness and tingling, and jaw pain. Cholesterol: Continue diet and exercise. Check cholesterol.  Pre-diabetes-Continue diet and exercise. Check A1C Vitamin D Def- check level and continue medications.  Obesity with co morbidities- long discussion about weight loss, diet, and exercise   Continue diet and meds as discussed. Further disposition pending results of labs.  HPI 68 y.o. female  presents for 3 month follow up with hypertension, hyperlipidemia, prediabetes and vitamin D. Her blood pressure has been controlled at home with ziac and hyzaar, today their BP is BP: 128/62 mmHg She does not workout, she has had negative myoview stress 09/2011. She denies chest pain, shortness of breath, dizziness.  She is on cholesterol medication, pravachol 40mg  and zetia and denies myalgias. Her cholesterol is at goal. The cholesterol last visit was:   Lab Results  Component Value Date   CHOL 206* 10/13/2013   HDL 85 10/13/2013   LDLCALC 101* 10/13/2013   TRIG 100 10/13/2013   CHOLHDL 2.4 10/13/2013   She has been working on diet and exercise for prediabetes, on bASA, and denies paresthesia of the feet, polydipsia, polyuria and visual disturbances. Last A1C in the office was:  Lab Results  Component Value Date   HGBA1C 5.6 10/13/2013   Patient is on Vitamin D supplement.   Lab Results  Component Value Date   VD25OH 62 10/13/2013     BMI is Body mass index is 35.13 kg/(m^2)., increasing stress, just buried her mother and she is still having issues with her daughter.  Wt Readings from Last 3 Encounters:  01/15/14 216 lb (97.977 kg)  10/13/13 222 lb (100.699 kg)  07/07/13 218 lb (98.884 kg)    Current Medications:  Current Outpatient Prescriptions on File Prior to Visit  Medication  Sig Dispense Refill  . ALPRAZolam (XANAX) 0.5 MG tablet Take 1/2 to 1 tablet up to 3 x d ay if needed for anxiety or sleep 90 tablet 3  . aspirin 81 MG tablet Take 81 mg by mouth daily.    . bisoprolol-hydrochlorothiazide (ZIAC) 5-6.25 MG per tablet Take 1 tablet by mouth daily. For BP 90 tablet 99  . ezetimibe (ZETIA) 10 MG tablet Take 1 tablet (10 mg total) by mouth daily. For cholesterol 90 tablet 99  . losartan-hydrochlorothiazide (HYZAAR) 100-25 MG per tablet TAKE 1 TABLET BY MOUTH DAILY. 90 tablet 0  . Magnesium Oxide (MAG-CAPS PO) Take by mouth. 400 MG DAILY    . pravastatin (PRAVACHOL) 40 MG tablet Take 1 tablet (40 mg total) by mouth daily. For cholesterol 90 tablet 99   No current facility-administered medications on file prior to visit.   Medical History:  Past Medical History  Diagnosis Date  . HTN (hypertension)   . Hyperlipidemia   . Scoliosis   . Hepatitis C   . Obesity   . Tobacco use     stopped 6 years ago.  . Pre-diabetes   . Vitamin D deficiency    Allergies: No Known Allergies   Review of Systems: [X]  = complains of  [ ]  = denies  General: Fatigue [ ]  Fever [ ]  Chills [ ]  Weakness [ ]   Insomnia [ ]  Eyes: Redness [ ]  Blurred vision [ ]  Diplopia [ ]   ENT: Congestion [ ]  Sinus Pain [ ]  Post Nasal Drip [ ]   Sore Throat [ ]  Earache [ ]   Cardiac: Chest pain/pressure [ ]  SOB [ ]  Orthopnea [ ]   Palpitations [ ]   Paroxysmal nocturnal dyspnea[ ]  Claudication [ ]  Edema [ ]   Pulmonary: Cough [ ]  Wheezing[ ]   SOB [ ]   Snoring [ ]   GI: Nausea [ ]  Vomiting[ ]  Dysphagia[ ]  Heartburn[ ]  Abdominal pain [ ]  Constipation [ ] ; Diarrhea [ ] ; BRBPR [ ]  Melena[ ]  GU: Hematuria[ ]  Dysuria [ ]  Nocturia[ ]  Urgency [ ]   Hesitancy [ ]  Discharge [ ]  Neuro: Headaches[ ]  Vertigo[ ]  Paresthesias[ ]  Spasm [ ]  Speech changes [ ]  Incoordination [ ]   Ortho: Arthritis [ ]  Joint pain [ ]  Muscle pain [ ]  Joint swelling [ ]  Back Pain [ ]  Skin:  Rash [ ]   Pruritis [ ]  Change in skin lesion [ ]    Psych: Depression[ ]  Anxiety[ ]  Confusion [ ]  Memory loss [ ]   Heme/Lypmh: Bleeding [ ]  Bruising [ ]  Enlarged lymph nodes [ ]   Endocrine: Visual blurring [ ]  Paresthesia [ ]  Polyuria [ ]  Polydypsea [ ]    Heat/cold intolerance [ ]  Hypoglycemia [ ]   Family history- Review and unchanged Social history- Review and unchanged Physical Exam: BP 128/62 mmHg  Pulse 68  Temp(Src) 97.7 F (36.5 C)  Resp 16  Wt 216 lb (97.977 kg) Wt Readings from Last 3 Encounters:  01/15/14 216 lb (97.977 kg)  10/13/13 222 lb (100.699 kg)  07/07/13 218 lb (98.884 kg)   General Appearance: Well nourished, in no apparent distress. Eyes: PERRLA, EOMs, conjunctiva no swelling or erythema Sinuses: No Frontal/maxillary tenderness ENT/Mouth: Ext aud canals clear, TMs without erythema, bulging. No erythema, swelling, or exudate on post pharynx.  Tonsils not swollen or erythematous. Hearing normal.  Neck: Supple, thyroid normal.  Respiratory: Respiratory effort normal, BS equal bilaterally without rales, rhonchi, wheezing or stridor.  Cardio: RRR with no MRGs. Brisk peripheral pulses without edema.  Abdomen: Soft, + BS.  Non tender, no guarding, rebound, hernias, masses. Lymphatics: Non tender without lymphadenopathy.  Musculoskeletal: Full ROM, 5/5 strength, normal gait.  Skin: Warm, dry without rashes, lesions, ecchymosis.  Neuro: Cranial nerves intact. Normal muscle tone, no cerebellar symptoms. Sensation intact.  Psych: Awake and oriented X 3, normal affect, Insight and Judgment appropriate.    Quentin Mullingollier, Jayron Maqueda, PA-C 9:56 AM [facility]

## 2014-01-16 ENCOUNTER — Other Ambulatory Visit: Payer: Self-pay | Admitting: Physician Assistant

## 2014-01-16 DIAGNOSIS — Z87891 Personal history of nicotine dependence: Secondary | ICD-10-CM

## 2014-01-16 DIAGNOSIS — E871 Hypo-osmolality and hyponatremia: Secondary | ICD-10-CM

## 2014-01-16 LAB — HEMOGLOBIN A1C
Hgb A1c MFr Bld: 5.8 % — ABNORMAL HIGH (ref <5.7)
Mean Plasma Glucose: 120 mg/dL — ABNORMAL HIGH (ref <117)

## 2014-01-16 MED ORDER — LOSARTAN POTASSIUM 100 MG PO TABS: 100.0000 mg | ORAL_TABLET | Freq: Every day | ORAL | Status: DC

## 2014-01-19 ENCOUNTER — Ambulatory Visit (HOSPITAL_COMMUNITY)
Admission: RE | Admit: 2014-01-19 | Discharge: 2014-01-19 | Disposition: A | Discharge status: Home / Self Care | Payer: Medicare Other | Source: Ambulatory Visit | Attending: Physician Assistant | Admitting: Physician Assistant

## 2014-01-19 DIAGNOSIS — E871 Hypo-osmolality and hyponatremia: Secondary | ICD-10-CM | POA: Insufficient documentation

## 2014-01-19 DIAGNOSIS — Z87891 Personal history of nicotine dependence: Secondary | ICD-10-CM | POA: Insufficient documentation

## 2014-01-19 DIAGNOSIS — E559 Vitamin D deficiency, unspecified: Secondary | ICD-10-CM | POA: Insufficient documentation

## 2014-01-19 DIAGNOSIS — M4185 Other forms of scoliosis, thoracolumbar region: Secondary | ICD-10-CM | POA: Diagnosis not present

## 2014-01-19 DIAGNOSIS — I10 Essential (primary) hypertension: Secondary | ICD-10-CM | POA: Insufficient documentation

## 2014-01-19 DIAGNOSIS — R7309 Other abnormal glucose: Secondary | ICD-10-CM | POA: Diagnosis not present

## 2014-01-21 ENCOUNTER — Ambulatory Visit (INDEPENDENT_AMBULATORY_CARE_PROVIDER_SITE_OTHER): Payer: 59 | Admitting: *Deleted

## 2014-01-21 DIAGNOSIS — E871 Hypo-osmolality and hyponatremia: Secondary | ICD-10-CM

## 2014-01-21 LAB — BASIC METABOLIC PANEL
BUN: 12 mg/dL (ref 6–23)
CHLORIDE: 93 meq/L — AB (ref 96–112)
CO2: 30 mEq/L (ref 19–32)
Calcium: 9.7 mg/dL (ref 8.4–10.5)
Creat: 0.81 mg/dL (ref 0.50–1.10)
GLUCOSE: 102 mg/dL — AB (ref 70–99)
POTASSIUM: 5 meq/L (ref 3.5–5.3)
Sodium: 133 mEq/L — ABNORMAL LOW (ref 135–145)

## 2014-01-21 NOTE — Progress Notes (Signed)
Patient ID: Deanna CowmanSuzanne B Kramm, female   DOB: [birth year], 68 y.o.   MRN: [id number] Patient presents for recheck BMET and urine sodium per Amanda's order.  Patient states she d/c Hyzarr AD and changed to Losartan Rx.

## 2014-01-22 ENCOUNTER — Other Ambulatory Visit: Payer: Self-pay | Admitting: Internal Medicine

## 2014-01-22 LAB — SODIUM, URINE, RANDOM: Sodium, Ur: 15 mEq/L

## 2014-01-24 ENCOUNTER — Encounter: Payer: Self-pay | Admitting: *Deleted

## 2014-03-31 ENCOUNTER — Other Ambulatory Visit: Payer: Self-pay | Admitting: *Deleted

## 2014-03-31 DIAGNOSIS — I10 Essential (primary) hypertension: Secondary | ICD-10-CM

## 2014-03-31 DIAGNOSIS — E782 Mixed hyperlipidemia: Secondary | ICD-10-CM

## 2014-03-31 MED ORDER — BISOPROLOL-HYDROCHLOROTHIAZIDE 5-6.25 MG PO TABS: 1.0000 | ORAL_TABLET | Freq: Every day | ORAL | Status: DC

## 2014-03-31 MED ORDER — PRAVASTATIN SODIUM 40 MG PO TABS: 40.0000 mg | ORAL_TABLET | Freq: Every day | ORAL | Status: DC

## 2014-04-20 ENCOUNTER — Other Ambulatory Visit: Payer: Self-pay | Admitting: Internal Medicine

## 2014-04-21 ENCOUNTER — Ambulatory Visit: Payer: Self-pay | Admitting: Internal Medicine

## 2014-04-28 ENCOUNTER — Other Ambulatory Visit: Payer: Self-pay | Admitting: Dermatology

## 2014-05-13 ENCOUNTER — Ambulatory Visit (INDEPENDENT_AMBULATORY_CARE_PROVIDER_SITE_OTHER): Payer: Medicare Other | Admitting: Internal Medicine

## 2014-05-13 ENCOUNTER — Encounter: Payer: Self-pay | Admitting: Internal Medicine

## 2014-05-13 VITALS — BP 128/64 | HR 62 | Temp 98.0°F | Resp 16 | Ht 65.75 in | Wt 218.0 lb

## 2014-05-13 DIAGNOSIS — Z0000 Encounter for general adult medical examination without abnormal findings: Secondary | ICD-10-CM

## 2014-05-13 DIAGNOSIS — Z23 Encounter for immunization: Secondary | ICD-10-CM

## 2014-05-13 DIAGNOSIS — Z9181 History of falling: Secondary | ICD-10-CM

## 2014-05-13 DIAGNOSIS — R6889 Other general symptoms and signs: Secondary | ICD-10-CM

## 2014-05-13 DIAGNOSIS — E782 Mixed hyperlipidemia: Secondary | ICD-10-CM

## 2014-05-13 DIAGNOSIS — Z0001 Encounter for general adult medical examination with abnormal findings: Secondary | ICD-10-CM

## 2014-05-13 DIAGNOSIS — R7309 Other abnormal glucose: Secondary | ICD-10-CM

## 2014-05-13 DIAGNOSIS — Z1389 Encounter for screening for other disorder: Secondary | ICD-10-CM

## 2014-05-13 DIAGNOSIS — Z79899 Other long term (current) drug therapy: Secondary | ICD-10-CM

## 2014-05-13 DIAGNOSIS — B1920 Unspecified viral hepatitis C without hepatic coma: Secondary | ICD-10-CM

## 2014-05-13 DIAGNOSIS — Z789 Other specified health status: Secondary | ICD-10-CM

## 2014-05-13 DIAGNOSIS — R7303 Prediabetes: Secondary | ICD-10-CM

## 2014-05-13 DIAGNOSIS — I10 Essential (primary) hypertension: Secondary | ICD-10-CM

## 2014-05-13 DIAGNOSIS — E559 Vitamin D deficiency, unspecified: Secondary | ICD-10-CM

## 2014-05-13 DIAGNOSIS — Z1331 Encounter for screening for depression: Secondary | ICD-10-CM

## 2014-05-13 LAB — CBC WITH DIFFERENTIAL/PLATELET
Basophils Absolute: 0 10*3/uL (ref 0.0–0.1)
Basophils Relative: 0 % (ref 0–1)
Eosinophils Absolute: 0.1 10*3/uL (ref 0.0–0.7)
Eosinophils Relative: 1 % (ref 0–5)
HCT: 43.6 % (ref 36.0–46.0)
HEMOGLOBIN: 14.7 g/dL (ref 12.0–15.0)
LYMPHS ABS: 1.4 10*3/uL (ref 0.7–4.0)
Lymphocytes Relative: 19 % (ref 12–46)
MCH: 32 pg (ref 26.0–34.0)
MCHC: 33.7 g/dL (ref 30.0–36.0)
MCV: 95 fL (ref 78.0–100.0)
MONO ABS: 0.6 10*3/uL (ref 0.1–1.0)
MPV: 9.3 fL (ref 8.6–12.4)
Monocytes Relative: 8 % (ref 3–12)
NEUTROS PCT: 72 % (ref 43–77)
Neutro Abs: 5.4 10*3/uL (ref 1.7–7.7)
Platelets: 278 10*3/uL (ref 150–400)
RBC: 4.59 MIL/uL (ref 3.87–5.11)
RDW: 13.5 % (ref 11.5–15.5)
WBC: 7.5 10*3/uL (ref 4.0–10.5)

## 2014-05-13 NOTE — Progress Notes (Addendum)
Patient ID: Deanna Gomez, female   DOB: 1945/11/23, 69 y.o.   MRN: [id number]   [facility] VISIT AND CPE  Assessment:    1. Essential hypertension -Dash diet -cont meds -cont diet and exercise - TSH  2. Prediabetes -cont at last visit with diet and exercise -cont weight loss - Hemoglobin A1c - Insulin, fasting  3. Vitamin D deficiency -cont supplement - Vit D  25 hydroxy (rtn osteoporosis monitoring)  4. Hyperlipidemia -cont meds, -at goal - Lipid panel  5. Medication management -check labs - CBC with Differential/Platelet - BASIC METABOLIC PANEL WITH GFR - Hepatic function panel - Magnesium  6. Hepatitis C virus infection without hepatic coma, unspecified chronicity -followed by GI, check LFTs  7. Routine general medical examination at a health care facility -doing well  8. Depression screen -negative screen, denies depression  9. At low risk for fall -cont exercise  10. Need for prophylactic vaccination against Streptococcus pneumoniae (pneumococcus) -given today.  All other vaccines UTD - Pneumococcal conjugate vaccine 13-valent     Plan:   During the course of the visit the patient was educated and counseled about appropriate screening and preventive services including:    Pneumococcal vaccine   Influenza vaccine  Td vaccine  Screening electrocardiogram  Bone densitometry screening  Colorectal cancer screening  Diabetes screening  Glaucoma screening  Nutrition counseling   Advanced directives: requested  Screening recommendations, referrals: Vaccinations:  Immunization History  Administered Date(s) Administered  . DTaP 03/06/2006  . Influenza Split 11/20/2013  . Influenza Whole 12/19/2012  . Typhoid Live 03/06/2004  . Zoster 06/11/2007    Tdap vaccine not indicated Influenza vaccine not indicated Pneumococcal vaccine not indicated Prevnar vaccine ordered Shingles vaccine not indicated Hep B vaccine  not indicated  Nutrition assessed and recommended  Colonoscopy not indicated Recommended yearly ophthalmology/optometry visit for glaucoma screening and checkup Recommended yearly dental visit for hygiene and checkup Advanced directives - not indicated  Conditions/risks identified: BMI: Discussed weight loss, diet, and increase physical activity.  Increase physical activity: AHA recommends 150 minutes of physical activity a week.  Medications reviewed Diabetes is at goal, ACE/ARB therapy: Yes. Urinary Incontinence is not an issue: discussed non pharmacology and pharmacology options.  Fall risk: low- discussed PT, home fall assessment, medications.   Subjective:   Deanna Gomez is a 69 y.o. female who presents for Medicare Annual Wellness Visit and complete physical.  Date of last medicare wellness visit is unknown.  She has had elevated blood pressure since 1996. Her blood pressure has been controlled at home, & today their BP is BP: 128/64 mmHg She does not workout currently as her husband made her quit the workout facility that she used to go to. She denies chest pain, shortness of breath, dizziness.   She is on cholesterol medication and denies myalgias. Her cholesterol is at goal. The cholesterol last visit was:  Lab Results  Component Value Date   CHOL 216* 01/15/2014   HDL 102 01/15/2014   LDLCALC 99 01/15/2014   TRIG 73 01/15/2014   CHOLHDL 2.1 01/15/2014    She has had prediabetes for 4years (2012). She has been working on diet and exercise for prediabetes, and denies foot ulcerations, hyperglycemia, hypoglycemia , increased appetite, nausea, paresthesia of the feet, polydipsia, polyuria, visual disturbances, vomiting and weight loss. Last A1C in the office was:  Lab Results  Component Value Date   HGBA1C 5.8* 01/15/2014    Patient is on Vitamin D supplement.  Lab Results  Component Value Date   VD25OH 62 10/13/2013      Things have been very stressful.   Her daughter has had issues with a DUI and that has been a major trigger.  She has also been taking care of her husband for his medical issues.  No anhedonia.  She is still doing things with her girlfriends.  No crying spells.    Names of Other Physician/Practitioners you currently use: 1. [facility] here for primary care 2. [place], eye doctor, last visit 2016 3. dentist, last visit 2015  Patient Care Team: Lucky CowboyWilliam McKeown, MD as PCP - General (Internal Medicine) Davina PokePeter K Dunn (Optometry) Sharrell KuJeffrey Medoff, MD as Consulting Physician (Gastroenterology) Teodora MediciHoward Mezer, MD as Consulting Physician (Gynecology)  Medication Review: Current Outpatient Prescriptions on File Prior to Visit  Medication Sig Dispense Refill  . ALPRAZolam (XANAX) 0.5 MG tablet Take 1/2 to 1 tablet up to 3 x d ay if needed for anxiety or sleep 90 tablet 3  . aspirin 81 MG tablet Take 81 mg by mouth daily.    . bisoprolol-hydrochlorothiazide (ZIAC) 5-6.25 MG per tablet Take 1 tablet by mouth daily. For BP 90 tablet 1  . losartan (COZAAR) 100 MG tablet Take 1 tablet (100 mg total) by mouth daily. 30 tablet 3  . Magnesium Oxide (MAG-CAPS PO) Take by mouth. 400 MG DAILY    . pravastatin (PRAVACHOL) 40 MG tablet Take 1 tablet (40 mg total) by mouth daily. For cholesterol 90 tablet 1  . ZETIA 10 MG tablet TAKE 1 TABLET (10 MG TOTAL) BY MOUTH DAILY. FOR CHOLESTEROL 90 tablet 99   No current facility-administered medications on file prior to visit.    Current Problems (verified) Patient Active Problem List   Diagnosis Date Noted  . Medication management 10/13/2013  . Essential hypertension 03/09/2013  . Hyperlipidemia 03/09/2013  . Prediabetes 03/09/2013  . Vitamin D deficiency 03/09/2013  . Hepatitis C virus infection 03/09/2013    Screening Tests Health Maintenance  Topic Date Due  . DEXA SCAN  05/18/2008  . PNA vac Low Risk Adult (1 of 2 - PCV13) 05/18/2008  . INFLUENZA  VACCINE  10/05/2014  . MAMMOGRAM  05/30/2015  . TETANUS/TDAP  03/06/2016  . COLONOSCOPY  08/29/2019  . ZOSTAVAX  Completed    Immunization History  Administered Date(s) Administered  . DTaP 03/06/2006  . Influenza Split 11/20/2013  . Influenza Whole 12/19/2012  . Typhoid Live 03/06/2004  . Zoster 06/11/2007    Preventative care: Last colonoscopy: Patient due for one next month with Dr. Kinnie ScalesMedoff  History reviewed: allergies, current medications, past family history, past medical history, past social history, past surgical history and problem list  Risk Factors: Tobacco History  Substance Use Topics  . Smoking status: Former Smoker -- 1.00 packs/day for 30 years    Types: Cigarettes    Quit date: 08/28/2004  . Smokeless tobacco: Not on file     Comment: QUIT SIX YEARS AGO  . Alcohol Use: 7.0 oz/week    14 drink(s) per week   She does not smoke.  Patient is not a former smoker. Are there smokers in your home (other than you)?  No Alcohol Current alcohol use: social drinker  Caffeine Current caffeine use: denies use  Exercise Current exercise: none  Nutrition/Diet Current diet: in general, a "healthy" diet    Cardiac risk factors: advanced age (older than 3255 for men, 1965 for women), diabetes mellitus, dyslipidemia, hypertension and sedentary lifestyle.  Depression Screen (Note: if answer to either of the following is "Yes", a more complete depression screening is indicated)   Q1: Over the past two weeks, have you felt down, depressed or hopeless? No  Q2: Over the past two weeks, have you felt little interest or pleasure in doing things? No  Have you lost interest or pleasure in daily life? No  Do you often feel hopeless? No  Do you cry easily over simple problems? No  Activities of Daily Living In your present state of health, do you have any difficulty performing the following activities?:  Driving? No Managing money?  No Feeding yourself? No Getting from bed  to chair? No Climbing a flight of stairs? No Preparing food and eating?: No Bathing or showering? No Getting dressed: No Getting to the toilet? No Using the toilet:No Moving around from place to place: No In the past year have you fallen or had a near fall?:No   Are you sexually active?  Yes  Do you have more than one partner?  No  Vision Difficulties: No  Hearing Difficulties: No Do you often ask people to speak up or repeat themselves? No Do you experience ringing or noises in your ears? No Do you have difficulty understanding soft or whispered voices? Sometimes.  Cognition  Do you feel that you have a problem with memory?No  Do you often misplace items? No  Do you feel safe at home?  Yes  Advanced directives Does patient have a Health Care Power of Attorney? No Does patient have a Living Will? No  Past Medical History  Diagnosis Date  . HTN (hypertension)   . Hyperlipidemia   . Scoliosis   . Hepatitis C   . Obesity   . Tobacco use     stopped 6 years ago.  . Pre-diabetes   . Vitamin D deficiency    Past Surgical History  Procedure Laterality Date  . Back surgery  1986    x 3  . Breast reconstruction      x 2  . Cataract extraction      Bilateral    ROS: Constitutional: Denies fever, chills, weight loss/gain, headaches, insomnia, fatigue, night sweats, and change in appetite. Eyes: Denies redness, blurred vision, diplopia, discharge, itchy, watery eyes.  ENT: Denies discharge, congestion, post nasal drip, epistaxis, sore throat, earache, hearing loss, dental pain, Tinnitus, Vertigo, Sinus pain, snoring.  Cardio: Denies chest pain, palpitations, irregular heartbeat, syncope, dyspnea, diaphoresis, orthopnea, PND, claudication, edema Respiratory: denies cough, dyspnea, DOE, pleurisy, hoarseness, laryngitis, wheezing.  Gastrointestinal: Denies dysphagia, heartburn, reflux, water brash, pain, cramps, nausea, vomiting, bloating, diarrhea, constipation,  hematemesis, melena, hematochezia, jaundice, hemorrhoids Genitourinary: Denies dysuria, frequency, urgency, nocturia, hesitancy, discharge, hematuria, flank pain Breast: Breast lumps, nipple discharge, bleeding.  Musculoskeletal: Denies arthralgia, myalgia, stiffness, Jt. Swelling, pain, limp, and strain/sprain. Denies falls. Skin: Denies puritis, rash, hives, warts, acne, eczema, changing in skin lesion Neuro: No weakness, tremor, incoordination, spasms, paresthesia, pain Psychiatric: Denies confusion, memory loss, sensory loss. Denies Depression. Endocrine: Denies change in weight, skin, hair change, nocturia, and paresthesia, diabetic polys, visual blurring, hyper / hypo glycemic episodes.  Heme/Lymph: No excessive bleeding, bruising, enlarged lymph nodes  Objective:     BP 128/64 mmHg  Pulse 62  Temp(Src) 98 F (36.7 C) (Temporal)  Resp 16  Ht 5' 5.75" (1.67 m)  Wt 218 lb (98.884 kg)  BMI 35.46 kg/m2  General Appearance: Well nourished, alert, WD/WN, femaleand in no apparent distress. Eyes: PERRLA, EOMs, conjunctiva no  swelling or erythema, normal fundi and vessels. Sinuses: No frontal/maxillary tenderness ENT/Mouth: EACs patent / TMs  nl. Nares clear without erythema, swelling, mucoid exudates. Oral hygiene is good. No erythema, swelling, or exudate. Tongue normal, non-obstructing. Tonsils not swollen or erythematous. Hearing normal.  Neck: Supple, thyroid normal. No bruits, nodes or JVD. Respiratory: Respiratory effort normal.  BS equal and clear bilateral without rales, rhonci, wheezing or stridor. Cardio: Heart sounds are normal with regular rate and rhythm and no murmurs, rubs or gallops. Peripheral pulses are normal and equal bilaterally without edema. No aortic or femoral bruits. Chest: symmetric with normal excursions and percussion. Breasts: Symmetric, without lumps, nipple discharge, retractions, or fibrocystic changes.  Abdomen: Flat, soft  with nl bowel sounds.  Nontender, no guarding, rebound, hernias, masses, or organomegaly.  Lymphatics: Non tender without lymphadenopathy.  Genitourinary:  Musculoskeletal: Full ROM all peripheral extremities, joint stability, 5/5 strength, and normal gait. Skin: Warm and dry without rashes, lesions, cyanosis, clubbing or  ecchymosis.  Neuro: Cranial nerves intact, reflexes equal bilaterally. Normal muscle tone, no cerebellar symptoms. Sensation intact.  Pysch: Awake and oriented X 3, normal affect, Insight and Judgment appropriate.   Cognitive Testing  Alert? Yes  Normal Appearance?Yes  Oriented to person? Yes  Place? Yes   Time? Yes  Recall of three objects?  Yes  Can perform simple calculations? Yes  Displays appropriate judgment? Yes  Can read the correct time from a watch/clock?Yes  Medicare Attestation I have personally reviewed: The patient's medical and social history Their use of alcohol, tobacco or illicit drugs Their current medications and supplements The patient's functional ability including ADLs,fall risks, home safety risks, cognitive, and hearing and visual impairment Diet and physical activities Evidence for depression or mood disorders  The patient's weight, height, BMI, and visual acuity have been recorded in the chart.  I have made referrals, counseling, and provided education to the patient based on review of the above and I have provided the patient with a written personalized care plan for preventive services.    Terri Piedra, PA-C   05/13/2014

## 2014-05-13 NOTE — Patient Instructions (Signed)

## 2014-05-14 LAB — MAGNESIUM: MAGNESIUM: 1.6 mg/dL (ref 1.5–2.5)

## 2014-05-14 LAB — HEPATIC FUNCTION PANEL
ALBUMIN: 4.6 g/dL (ref 3.5–5.2)
ALT: 29 U/L (ref 0–35)
AST: 43 U/L — AB (ref 0–37)
Alkaline Phosphatase: 58 U/L (ref 39–117)
Bilirubin, Direct: 0.2 mg/dL (ref 0.0–0.3)
Indirect Bilirubin: 0.6 mg/dL (ref 0.2–1.2)
Total Bilirubin: 0.8 mg/dL (ref 0.2–1.2)
Total Protein: 7.7 g/dL (ref 6.0–8.3)

## 2014-05-14 LAB — BASIC METABOLIC PANEL WITH GFR
BUN: 15 mg/dL (ref 6–23)
CHLORIDE: 93 meq/L — AB (ref 96–112)
CO2: 21 mEq/L (ref 19–32)
CREATININE: 0.81 mg/dL (ref 0.50–1.10)
Calcium: 10.5 mg/dL (ref 8.4–10.5)
GFR, EST AFRICAN AMERICAN: 85 mL/min
GFR, Est Non African American: 74 mL/min
GLUCOSE: 95 mg/dL (ref 70–99)
Potassium: 4.8 mEq/L (ref 3.5–5.3)
SODIUM: 135 meq/L (ref 135–145)

## 2014-05-14 LAB — HEMOGLOBIN A1C
HEMOGLOBIN A1C: 5.7 % — AB (ref <5.7)
MEAN PLASMA GLUCOSE: 117 mg/dL — AB (ref <117)

## 2014-05-14 LAB — TSH: TSH: 2.061 u[IU]/mL (ref 0.350–4.500)

## 2014-05-14 LAB — LIPID PANEL
CHOL/HDL RATIO: 2.1 ratio
CHOLESTEROL: 218 mg/dL — AB (ref 0–200)
HDL: 105 mg/dL (ref >=46)
LDL CALC: 95 mg/dL (ref 0–99)
Triglycerides: 89 mg/dL (ref <150)
VLDL: 18 mg/dL (ref 0–40)

## 2014-05-14 LAB — INSULIN, FASTING: Insulin fasting, serum: 8.5 u[IU]/mL (ref 2.0–19.6)

## 2014-05-14 LAB — VITAMIN D 25 HYDROXY (VIT D DEFICIENCY, FRACTURES): VIT D 25 HYDROXY: 50 ng/mL (ref 30–100)

## 2014-05-19 ENCOUNTER — Other Ambulatory Visit: Payer: Self-pay | Admitting: Dermatology

## 2014-07-14 ENCOUNTER — Other Ambulatory Visit: Payer: Self-pay | Admitting: Internal Medicine

## 2014-07-14 ENCOUNTER — Other Ambulatory Visit: Payer: Self-pay | Admitting: Physician Assistant

## 2014-07-18 ENCOUNTER — Encounter: Payer: Self-pay | Admitting: *Deleted

## 2014-07-21 ENCOUNTER — Telehealth: Payer: Self-pay | Admitting: *Deleted

## 2014-07-21 NOTE — Telephone Encounter (Signed)
Patient called and states she is out of town and ruined most of her [place].  She will be out of the med for 2 days before she gets home.  Per Dr Oneta RackMcKeown, OK to leave off x 2 days.  Patient aware.

## 2014-09-08 ENCOUNTER — Other Ambulatory Visit: Payer: Self-pay | Admitting: *Deleted

## 2014-09-08 DIAGNOSIS — G4700 Insomnia, unspecified: Secondary | ICD-10-CM

## 2014-09-08 MED ORDER — ALPRAZOLAM 0.5 MG PO TABS: ORAL_TABLET | ORAL | Status: DC

## 2014-10-15 ENCOUNTER — Ambulatory Visit (INDEPENDENT_AMBULATORY_CARE_PROVIDER_SITE_OTHER): Payer: Medicare Other | Admitting: Internal Medicine

## 2014-10-15 ENCOUNTER — Encounter: Payer: Self-pay | Admitting: Internal Medicine

## 2014-10-15 ENCOUNTER — Other Ambulatory Visit: Payer: Self-pay | Admitting: *Deleted

## 2014-10-15 VITALS — BP 142/88 | HR 68 | Temp 97.8°F | Resp 16 | Ht 65.5 in | Wt 207.2 lb

## 2014-10-15 DIAGNOSIS — E559 Vitamin D deficiency, unspecified: Secondary | ICD-10-CM | POA: Diagnosis not present

## 2014-10-15 DIAGNOSIS — R7303 Prediabetes: Secondary | ICD-10-CM

## 2014-10-15 DIAGNOSIS — Z6834 Body mass index (BMI) 34.0-34.9, adult: Secondary | ICD-10-CM | POA: Diagnosis not present

## 2014-10-15 DIAGNOSIS — I10 Essential (primary) hypertension: Secondary | ICD-10-CM | POA: Diagnosis not present

## 2014-10-15 DIAGNOSIS — R7309 Other abnormal glucose: Secondary | ICD-10-CM

## 2014-10-15 DIAGNOSIS — Z79899 Other long term (current) drug therapy: Secondary | ICD-10-CM | POA: Diagnosis not present

## 2014-10-15 DIAGNOSIS — E782 Mixed hyperlipidemia: Secondary | ICD-10-CM | POA: Diagnosis not present

## 2014-10-15 DIAGNOSIS — E669 Obesity, unspecified: Secondary | ICD-10-CM | POA: Insufficient documentation

## 2014-10-15 DIAGNOSIS — Z1389 Encounter for screening for other disorder: Secondary | ICD-10-CM | POA: Diagnosis not present

## 2014-10-15 DIAGNOSIS — Z1212 Encounter for screening for malignant neoplasm of rectum: Secondary | ICD-10-CM

## 2014-10-15 DIAGNOSIS — Z789 Other specified health status: Secondary | ICD-10-CM | POA: Diagnosis not present

## 2014-10-15 DIAGNOSIS — Z1331 Encounter for screening for depression: Secondary | ICD-10-CM

## 2014-10-15 DIAGNOSIS — Z9181 History of falling: Secondary | ICD-10-CM

## 2014-10-15 LAB — LIPID PANEL
Cholesterol: 212 mg/dL — ABNORMAL HIGH (ref 125–200)
HDL: 85 mg/dL (ref >=46)
LDL Cholesterol: 107 mg/dL (ref <130)
Total CHOL/HDL Ratio: 2.5 Ratio (ref <=5.0)
Triglycerides: 101 mg/dL (ref <150)
VLDL: 20 mg/dL (ref <30)

## 2014-10-15 LAB — CBC WITH DIFFERENTIAL/PLATELET
BASOS PCT: 0 % (ref 0–1)
Basophils Absolute: 0 10*3/uL (ref 0.0–0.1)
EOS ABS: 0.1 10*3/uL (ref 0.0–0.7)
EOS PCT: 1 % (ref 0–5)
HCT: 39.2 % (ref 36.0–46.0)
Hemoglobin: 13.8 g/dL (ref 12.0–15.0)
LYMPHS ABS: 1.4 10*3/uL (ref 0.7–4.0)
Lymphocytes Relative: 17 % (ref 12–46)
MCH: 31.7 pg (ref 26.0–34.0)
MCHC: 35.2 g/dL (ref 30.0–36.0)
MCV: 90.1 fL (ref 78.0–100.0)
MPV: 9.3 fL (ref 8.6–12.4)
Monocytes Absolute: 0.6 10*3/uL (ref 0.1–1.0)
Monocytes Relative: 7 % (ref 3–12)
Neutro Abs: 6.4 10*3/uL (ref 1.7–7.7)
Neutrophils Relative %: 75 % (ref 43–77)
Platelets: 278 10*3/uL (ref 150–400)
RBC: 4.35 MIL/uL (ref 3.87–5.11)
RDW: 13.7 % (ref 11.5–15.5)
WBC: 8.5 10*3/uL (ref 4.0–10.5)

## 2014-10-15 LAB — HEPATIC FUNCTION PANEL
ALBUMIN: 4.4 g/dL (ref 3.6–5.1)
ALT: 12 U/L (ref 6–29)
AST: 18 U/L (ref 10–35)
Alkaline Phosphatase: 65 U/L (ref 33–130)
Bilirubin, Direct: 0.2 mg/dL (ref <=0.2)
Indirect Bilirubin: 0.7 mg/dL (ref 0.2–1.2)
Total Bilirubin: 0.9 mg/dL (ref 0.2–1.2)
Total Protein: 7.1 g/dL (ref 6.1–8.1)

## 2014-10-15 LAB — BASIC METABOLIC PANEL WITH GFR
BUN: 14 mg/dL (ref 7–25)
CHLORIDE: 96 mmol/L — AB (ref 98–110)
CO2: 28 mmol/L (ref 20–31)
CREATININE: 0.71 mg/dL (ref 0.60–0.93)
Calcium: 9.7 mg/dL (ref 8.6–10.4)
GFR, EST NON AFRICAN AMERICAN: 86 mL/min (ref >=60)
GFR, Est African American: >89 mL/min (ref >=60)
Glucose, Bld: 94 mg/dL (ref 65–99)
POTASSIUM: 4.6 mmol/L (ref 3.5–5.3)
Sodium: 136 mmol/L (ref 135–146)

## 2014-10-15 LAB — MAGNESIUM: Magnesium: 1.7 mg/dL (ref 1.5–2.5)

## 2014-10-15 LAB — TSH: TSH: 1.876 u[IU]/mL (ref 0.350–4.500)

## 2014-10-15 MED ORDER — LOSARTAN POTASSIUM 100 MG PO TABS: ORAL_TABLET | ORAL | Status: DC

## 2014-10-15 MED ORDER — BISOPROLOL-HYDROCHLOROTHIAZIDE 5-6.25 MG PO TABS: ORAL_TABLET | ORAL | Status: DC

## 2014-10-15 MED ORDER — PRAVASTATIN SODIUM 40 MG PO TABS: ORAL_TABLET | ORAL | Status: DC

## 2014-10-15 NOTE — Progress Notes (Addendum)
Patient ID: Deanna Gomez, female   DOB: 1945-12-09, 69 y.o.   MRN: [id number]   Comprehensive Examination  This very nice 69 y.o. MWF  presents for complete physical.  Patient has been followed for HTN, Prediabetes, Hyperlipidemia, and Vitamin D Deficiency.  Patient relates she is having occasional difficulty with swallowing or choking on food and occasional dysphagia and that she is scheduled for 5 yr surveillance colonoscopy in 2 weeks by Dr Kinnie Scales.    HTN predates since 1996. Patient's BP has been controlled at home and patient denies any cardiac symptoms as chest pain, palpitations, shortness of breath, dizziness or ankle swelling. In 2013 she had a normal & negative stress Myoview.  Today's BP was borderline elevated at 142/88.     Patient's hyperlipidemia is controlled with diet and medications. Patient denies myalgias or other medication SE's. Last lipids were at goal - Cholesterol 218; HDL 105; LDL 95; Triglycerides 89 on 05/13/2014.   Patient has Morbid Obesity (BMI 34)  and consequent prediabetes predating since 2012 with A1c 5.7% and then 5.9% in 2013 & 2014. Patient admits poor dietary compliance with overeating and binge eating.  Patient denies reactive hypoglycemic symptoms, visual blurring, diabetic polys, or paresthesias. Last A1c was 5.7% on 05/13/2014.    Patient has chronic Low Back Pain Syndrome secondary to Severe scoliosis and spinal surg/fusion  x 3 for Harrington rods. Finally, patient has history of Vitamin D Deficiency of "71" in 2008 and last Vitamin D was 50 on 05/13/2014.      Medication Sig  . ALPRAZolam (XANAX) 0.5 MG tablet Take 1/2 to 1 tablet up to 3 x d ay if needed for anxiety or sleep  . aspirin 81 MG tablet Take 81 mg by mouth daily.  . bisoprolol-hctz (ZIAC) 5-6.25 MG per tablet TAKE 1 TABLET BY MOUTH DAILY FOR BP  . losartan (COZAAR) 100 MG tablet TAKE 1 TABLET (100 MG TOTAL) BY MOUTH DAILY.  . Magnesium Oxide (MAG-CAPS PO) Take by mouth. 400 MG DAILY  .  pravastatin (PRAVACHOL) 40 MG tablet TAKE 1 TABLET (40 MG TOTAL) BY MOUTH DAILY FOR CHOLESTEROL  . ZETIA 10 MG tablet TAKE 1 TABLET (10 MG TOTAL) BY MOUTH DAILY. FOR CHOLESTEROL   No Known Allergies   Past Medical History  Diagnosis Date  . HTN (hypertension)   . Hyperlipidemia   . Scoliosis   . Hepatitis C   . Obesity   . Tobacco use     stopped 6 years ago.  . Pre-diabetes   . Vitamin D deficiency    Health Maintenance  Topic Date Due  . DEXA SCAN  05/18/2008  . INFLUENZA VACCINE  10/05/2014  . PNA vac Low Risk Adult (2 of 2 - PPSV23) 05/13/2015  . TETANUS/TDAP  03/06/2016  . MAMMOGRAM  06/14/2016  . COLONOSCOPY  08/29/2019  . ZOSTAVAX  Completed  . Hepatitis C Screening  Completed   Immunization History  Administered Date(s) Administered  . DTaP 03/06/2006  . Influenza Split 11/20/2013  . Influenza Whole 12/19/2012  . Pneumococcal Conjugate-13 05/13/2014  . Typhoid Live 03/06/2004  . Zoster 06/11/2007   Past Surgical History  Procedure Laterality Date  . Back surgery  1986    x 3  . Breast reconstruction      x 2  . Cataract extraction      Bilateral   Family History  Problem Relation Age of Onset  . Coronary artery disease Brother   . Hypertension Mother    Social  History  Substance Use Topics  . Smoking status: Former Smoker -- 1.00 packs/day for 30 years    Types: Cigarettes    Quit date: 08/28/2004  . Smokeless tobacco: None     Comment: QUIT SIX YEARS AGO  . Alcohol Use: 7.0 oz/week    14 drink(s) per week    ROS Constitutional: Denies fever, chills, weight loss/gain, headaches, insomnia,  night sweats, and change in appetite. Does c/o fatigue. Eyes: Denies redness, blurred vision, diplopia, discharge, itchy, watery eyes.  ENT: Denies discharge, congestion, post nasal drip, epistaxis, sore throat, earache, hearing loss, dental pain, Tinnitus, Vertigo, Sinus pain, snoring.  Cardio: Denies chest pain, palpitations, irregular heartbeat, syncope,  dyspnea, diaphoresis, orthopnea, PND, claudication, edema Respiratory: denies cough, dyspnea, DOE, pleurisy, hoarseness, laryngitis, wheezing.  Gastrointestinal: Denies dysphagia, heartburn, reflux, water brash, pain, cramps, nausea, vomiting, bloating, diarrhea, constipation, hematemesis, melena, hematochezia, jaundice, hemorrhoids Genitourinary: Denies dysuria, frequency, urgency, nocturia, hesitancy, discharge, hematuria, flank pain Breast: Breast lumps, nipple discharge, bleeding.  Musculoskeletal: Denies arthralgia, myalgia, stiffness, Jt. Swelling, pain, limp, and strain/sprain. Denies falls. Skin: Denies puritis, rash, hives, warts, acne, eczema, changing in skin lesion Neuro: No weakness, tremor, incoordination, spasms, paresthesia, pain Psychiatric: Denies confusion, memory loss, sensory loss. Denies Depression. Endocrine: Denies change in weight, skin, hair change, nocturia, and paresthesia, diabetic polys, visual blurring, hyper / hypo glycemic episodes.  Heme/Lymph: No excessive bleeding, bruising, enlarged lymph nodes.  Physical Exam  BP 142/88   Pulse 68  Temp 97.8 F   Resp 16  Ht 5' 5.5"   Wt 207 lb 3.2 oz     BMI 33.94   General Appearance: Over nourished with central morbid obesity and in no distress. Eyes: PERRLA, EOMs, conjunctiva no swelling or erythema, normal fundi and vessels. Sinuses: No frontal/maxillary tenderness ENT/Mouth: EACs patent / TMs  nl. Nares clear without erythema, swelling, mucoid exudates. Oral hygiene is good. No erythema, swelling, or exudate. Tongue normal, non-obstructing. Tonsils not swollen or erythematous. Hearing normal.  Neck: Supple, thyroid normal. No bruits, nodes or JVD. Respiratory: Respiratory effort normal.  BS equal and clear bilateral without rales, rhonci, wheezing or stridor. Cardio: Heart sounds are normal with regular rate and rhythm and no murmurs, rubs or gallops. Peripheral pulses are normal and equal bilaterally without  edema. No aortic or femoral bruits. Chest: symmetric with normal excursions and percussion. Breasts:  Rt breast with implant with rt faux nipple tatoo. Bilateral w/o abnormal masses.  Abdomen: Flat, soft, with bowel sounds. Nontender, no guarding, rebound, hernias, masses, or organomegaly.  Lymphatics: Non tender without lymphadenopathy.  Musculoskeletal: Full ROM all peripheral extremities, joint stability, 5/5 strength, and normal gait. 16 " vertical thoraco-lumbar scar with straightening of usual lordosis.  Skin: Warm and dry without rashes, lesions, cyanosis, clubbing or  ecchymosis.  Neuro: Cranial nerves intact, reflexes equal bilaterally. Normal muscle tone, no cerebellar symptoms. Sensation intact.  Pysch: Awake and oriented X 3, normal affect, Insight and Judgment appropriate.   Assessment and Plan  1. Essential hypertension  - Microalbumin / creatinine urine ratio - EKG 12-Lead - [place], RETROPERITNL ABD,  LTD - TSH  2. Hyperlipidemia  - Lipid panel  3. Prediabetes  - Hemoglobin A1c - Insulin, random  4. Vitamin D deficiency  - Vit D  25 hydroxy   5. Screening for rectal cancer  - POC Hemoccult Bld/Stl   6. Morbid obesity (BMI 35.46)   7. Depression screen  - Screen Negative  8. At low risk for fall   9.  Medication management  - Urine Microscopic - CBC with Differential/Platelet - BASIC METABOLIC PANEL WITH GFR - Hepatic function panel - Magnesium  10. Dysphagia  - Patient is advised to contact Dr Kinnie Scales in consideration of performing EGD at the time of her scheduled Colonoscopy in 2 weeks.   Continue prudent diet as discussed, weight control, BP monitoring, regular exercise, and medications. Discussed med's effects and SE's. Screening labs and tests as requested with regular follow-up as recommended.  Over 40 minutes of exam, counseling, chart review was performed.

## 2014-10-15 NOTE — Patient Instructions (Signed)
Recommend Adult Low dose Aspirin or coated  Aspirin 81 mg daily   To reduce risk of Colon Cancer 20 %,   Skin Cancer 26 % ,   Melanoma 46%   and   Pancreatic cancer 60% ++++++++++++++++++ Vitamin D goal is between 70-100.   Please make sure that you are taking your Vitamin D as directed.   It is very important as a natural anti-inflammatory   helping hair, skin, and nails, as well as reducing stroke and heart attack risk.   It helps your bones and helps with mood.  It also decreases numerous cancer risks so please take it as directed.   Low Vit D is associated with a 200-300% higher risk for CANCER   and 200-300% higher risk for HEART   ATTACK  &  STROKE.   ......................................  It is also associated with higher death rate at younger ages,   autoimmune diseases like Rheumatoid arthritis, Lupus, Multiple Sclerosis.     Also many other serious conditions, like depression, Alzheimer's  Dementia, infertility, muscle aches, fatigue, fibromyalgia - just to name a few.  +++++++++++++++++++  Recommend the book "The END of DIETING" by Dr Joel Fuhrman   & the book "The END of DIABETES " by Dr Joel Fuhrman  At [employer] - get book & Audio CD's     Being diabetic has a  300% increased risk for heart attack, stroke, cancer, and alzheimer- type vascular dementia. It is very important that you work harder with diet by avoiding all foods that are white. Avoid white rice (brown & wild rice is OK), white potatoes (sweetpotatoes in moderation is OK), White bread or wheat bread or anything made out of white flour like bagels, donuts, rolls, buns, biscuits, cakes, pastries, cookies, pizza crust, and pasta (made from white flour & egg whites) - vegetarian pasta or spinach or wheat pasta is OK. Multigrain breads like Arnold's or [employer], or multigrain sandwich thins or flatbreads.  Diet, exercise and weight loss can reverse and cure diabetes in the early stages.   Diet, exercise and weight loss is very important in the control and prevention of complications of diabetes which affects every system in your body, ie. Brain - dementia/stroke, eyes - glaucoma/blindness, heart - heart attack/heart failure, kidneys - dialysis, stomach - gastric paralysis, intestines - malabsorption, nerves - severe painful neuritis, circulation - gangrene & loss of a leg(s), and finally cancer and Alzheimers.    I recommend avoid fried & greasy foods,  sweets/candy, white rice (brown or wild rice or Quinoa is OK), white potatoes (sweet potatoes are OK) - anything made from white flour - bagels, doughnuts, rolls, buns, biscuits,white and wheat breads, pizza crust and traditional pasta made of white flour & egg white(vegetarian pasta or spinach or wheat pasta is OK).  Multi-grain bread is OK - like multi-grain flat bread or sandwich thins. Avoid alcohol in excess. Exercise is also important.    Eat all the vegetables you want - avoid meat, especially red meat and dairy - especially cheese.  Cheese is the most concentrated form of trans-fats which is the worst thing to clog up our arteries. Veggie cheese is OK which can be found in the fresh produce section at [employer] or [employer] or [employer]  ++++++++++++++++++++++++++   Preventive Care for Adults  A healthy lifestyle and preventive care can promote health and wellness. Preventive health guidelines for women include the following key practices.  A routine yearly physical is a good way   to check with your health care provider about your health and preventive screening. It is a chance to share any concerns and updates on your health and to receive a thorough exam.  Visit your dentist for a routine exam and preventive care every 6 months. Brush your teeth twice a day and floss once a day. Good oral hygiene prevents tooth decay and gum disease.  The frequency of eye exams is based on your age, health, family medical history, use  of contact lenses, and other factors. Follow your health care provider's recommendations for frequency of eye exams.  Eat a healthy diet. Foods like vegetables, fruits, whole grains, low-fat dairy products, and lean protein foods contain the nutrients you need without too many calories. Decrease your intake of foods high in solid fats, added sugars, and salt. Eat the right amount of calories for you.Get information about a proper diet from your health care provider, if necessary.  Regular physical exercise is one of the most important things you can do for your health. Most adults should get at least 150 minutes of moderate-intensity exercise (any activity that increases your heart rate and causes you to sweat) each week. In addition, most adults need muscle-strengthening exercises on 2 or more days a week.  Maintain a healthy weight. The body mass index (BMI) is a screening tool to identify possible weight problems. It provides an estimate of body fat based on height and weight. Your health care provider can find your BMI and can help you achieve or maintain a healthy weight.For adults 20 years and older:  A BMI below 18.5 is considered underweight.  A BMI of 18.5 to 24.9 is normal.  A BMI of 25 to 29.9 is considered overweight.  A BMI of 30 and above is considered obese.  Maintain normal blood lipids and cholesterol levels by exercising and minimizing your intake of saturated fat. Eat a balanced diet with plenty of fruit and vegetables. If your lipid or cholesterol levels are high, you are over 50, or you are at high risk for heart disease, you may need your cholesterol levels checked more frequently.Ongoing high lipid and cholesterol levels should be treated with medicines if diet and exercise are not working.  If you smoke, find out from your health care provider how to quit. If you do not use tobacco, do not start.  Lung cancer screening is recommended for adults aged 17-80 years who are  at high risk for developing lung cancer because of a history of smoking. A yearly low-dose CT scan of the lungs is recommended for people who have at least a 30-pack-year history of smoking and are a current smoker or have quit within the past 15 years. A pack year of smoking is smoking an average of 1 pack of cigarettes a day for 1 year (for example: 1 pack a day for 30 years or 2 packs a day for 15 years). Yearly screening should continue until the smoker has stopped smoking for at least 15 years. Yearly screening should be stopped for people who develop a health problem that would prevent them from having lung cancer treatment.  Avoid use of street drugs. Do not share needles with anyone. Ask for help if you need support or instructions about stopping the use of drugs.  High blood pressure causes heart disease and increases the risk of stroke.  Ongoing high blood pressure should be treated with medicines if weight loss and exercise do not work.  If you are 55-79  years old, ask your health care provider if you should take aspirin to prevent strokes.  Diabetes screening involves taking a blood sample to check your fasting blood sugar level. This should be done once every 3 years, after age 3, if you are within normal weight and without risk factors for diabetes. Testing should be considered at a younger age or be carried out more frequently if you are overweight and have at least 1 risk factor for diabetes.  Breast cancer screening is essential preventive care for women. You should practice "breast self-awareness." This means understanding the normal appearance and feel of your breasts and may include breast self-examination. Any changes detected, no matter how small, should be reported to a health care provider. Women in their 10s and 30s should have a clinical breast exam (CBE) by a health care provider as part of a regular health exam every 1 to 3 years. After age 67, women should have a CBE every  year. Starting at age 66, women should consider having a mammogram (breast X-ray test) every year. Women who have a family history of breast cancer should talk to their health care provider about genetic screening. Women at a high risk of breast cancer should talk to their health care providers about having an MRI and a mammogram every year.  Breast cancer gene (BRCA)-related cancer risk assessment is recommended for women who have family members with BRCA-related cancers. BRCA-related cancers include breast, ovarian, tubal, and peritoneal cancers. Having family members with these cancers may be associated with an increased risk for harmful changes (mutations) in the breast cancer genes BRCA1 and BRCA2. Results of the assessment will determine the need for genetic counseling and BRCA1 and BRCA2 testing.  Routine pelvic exams to screen for cancer are no longer recommended for nonpregnant women who are considered low risk for cancer of the pelvic organs (ovaries, uterus, and vagina) and who do not have symptoms. Ask your health care provider if a screening pelvic exam is right for you.  If you have had past treatment for cervical cancer or a condition that could lead to cancer, you need Pap tests and screening for cancer for at least 20 years after your treatment. If Pap tests have been discontinued, your risk factors (such as having a new sexual partner) need to be reassessed to determine if screening should be resumed. Some women have medical problems that increase the chance of getting cervical cancer. In these cases, your health care provider may recommend more frequent screening and Pap tests.    Colorectal cancer can be detected and often prevented. Most routine colorectal cancer screening begins at the age of 97 years and continues through age 68 years. However, your health care provider may recommend screening at an earlier age if you have risk factors for colon cancer. On a yearly basis, your health  care provider may provide home test kits to check for hidden blood in the stool. Use of a small camera at the end of a tube, to directly examine the colon (sigmoidoscopy or colonoscopy), can detect the earliest forms of colorectal cancer. Talk to your health care provider about this at age 65, when routine screening begins. Direct exam of the colon should be repeated every 5-10 years through age 81 years, unless early forms of pre-cancerous polyps or small growths are found.  Osteoporosis is a disease in which the bones lose minerals and strength with aging. This can result in serious bone fractures or breaks. The risk of osteoporosis  can be identified using a bone density scan. Women ages 13 years and over and women at risk for fractures or osteoporosis should discuss screening with their health care providers. Ask your health care provider whether you should take a calcium supplement or vitamin D to reduce the rate of osteoporosis.  Menopause can be associated with physical symptoms and risks. Hormone replacement therapy is available to decrease symptoms and risks. You should talk to your health care provider about whether hormone replacement therapy is right for you.  Use sunscreen. Apply sunscreen liberally and repeatedly throughout the day. You should seek shade when your shadow is shorter than you. Protect yourself by wearing long sleeves, pants, a wide-brimmed hat, and sunglasses year round, whenever you are outdoors.  Once a month, do a whole body skin exam, using a mirror to look at the skin on your back. Tell your health care provider of new moles, moles that have irregular borders, moles that are larger than a pencil eraser, or moles that have changed in shape or color.  Stay current with required vaccines (immunizations).  Influenza vaccine. All adults should be immunized every year.  Tetanus, diphtheria, and acellular pertussis (Td, Tdap) vaccine. Pregnant women should receive 1 dose of  Tdap vaccine during each pregnancy. The dose should be obtained regardless of the length of time since the last dose. Immunization is preferred during the 27th-36th week of gestation. An adult who has not previously received Tdap or who does not know her vaccine status should receive 1 dose of Tdap. This initial dose should be followed by tetanus and diphtheria toxoids (Td) booster doses every 10 years. Adults with an unknown or incomplete history of completing a 3-dose immunization series with Td-containing vaccines should begin or complete a primary immunization series including a Tdap dose. Adults should receive a Td booster every 10 years.    Zoster vaccine. One dose is recommended for adults aged 35 years or older unless certain conditions are present.    Pneumococcal 13-valent conjugate (PCV13) vaccine. When indicated, a person who is uncertain of her immunization history and has no record of immunization should receive the PCV13 vaccine. An adult aged 71 years or older who has certain medical conditions and has not been previously immunized should receive 1 dose of PCV13 vaccine. This PCV13 should be followed with a dose of pneumococcal polysaccharide (PPSV23) vaccine. The PPSV23 vaccine dose should be obtained at least 8 weeks after the dose of PCV13 vaccine. An adult aged 63 years or older who has certain medical conditions and previously received 1 or more doses of PPSV23 vaccine should receive 1 dose of PCV13. The PCV13 vaccine dose should be obtained 1 or more years after the last PPSV23 vaccine dose.    Pneumococcal polysaccharide (PPSV23) vaccine. When PCV13 is also indicated, PCV13 should be obtained first. All adults aged 40 years and older should be immunized. An adult younger than age 62 years who has certain medical conditions should be immunized. Any person who resides in a nursing home or long-term care facility should be immunized. An adult smoker should be immunized. People with an  immunocompromised condition and certain other conditions should receive both PCV13 and PPSV23 vaccines. People with human immunodeficiency virus (HIV) infection should be immunized as soon as possible after diagnosis. Immunization during chemotherapy or radiation therapy should be avoided. Routine use of PPSV23 vaccine is not recommended for American Indians, [place] Natives, or people younger than 65 years unless there are medical conditions that require  PPSV23 vaccine. When indicated, people who have unknown immunization and have no record of immunization should receive PPSV23 vaccine. One-time revaccination 5 years after the first dose of PPSV23 is recommended for people aged 19-64 years who have chronic kidney failure, nephrotic syndrome, asplenia, or immunocompromised conditions. People who received 1-2 doses of PPSV23 before age 6 years should receive another dose of PPSV23 vaccine at age 81 years or later if at least 5 years have passed since the previous dose. Doses of PPSV23 are not needed for people immunized with PPSV23 at or after age 25 years.   Preventive Services / Frequency  Ages 65 years and over  Blood pressure check.  Lipid and cholesterol check.  Lung cancer screening. / Every year if you are aged 72-80 years and have a 30-pack-year history of smoking and currently smoke or have quit within the past 15 years. Yearly screening is stopped once you have quit smoking for at least 15 years or develop a health problem that would prevent you from having lung cancer treatment.  Clinical breast exam.** / Every year after age 28 years.  BRCA-related cancer risk assessment.** / For women who have family members with a BRCA-related cancer (breast, ovarian, tubal, or peritoneal cancers).  Mammogram.** / Every year beginning at age 67 years and continuing for as long as you are in good health. Consult with your health care provider.  Pap test.** / Every 3 years starting at age 44 years  through age 38 or 31 years with 3 consecutive normal Pap tests. Testing can be stopped between 65 and 70 years with 3 consecutive normal Pap tests and no abnormal Pap or HPV tests in the past 10 years.  Fecal occult blood test (FOBT) of stool. / Every year beginning at age 97 years and continuing until age 31 years. You may not need to do this test if you get a colonoscopy every 10 years.  Flexible sigmoidoscopy or colonoscopy.** / Every 5 years for a flexible sigmoidoscopy or every 10 years for a colonoscopy beginning at age 3 years and continuing until age 60 years.  Hepatitis C blood test.** / For all people born from [birth year] through 1967 and any individual with known risks for hepatitis C.  Osteoporosis screening.** / A one-time screening for women ages 44 years and over and women at risk for fractures or osteoporosis.  Skin self-exam. / Monthly.  Influenza vaccine. / Every year.  Tetanus, diphtheria, and acellular pertussis (Tdap/Td) vaccine.** / 1 dose of Td every 10 years.  Zoster vaccine.** / 1 dose for adults aged 72 years or older.  Pneumococcal 13-valent conjugate (PCV13) vaccine.** / Consult your health care provider.  Pneumococcal polysaccharide (PPSV23) vaccine.** / 1 dose for all adults aged 64 years and older. Screening for abdominal aortic aneurysm (AAA)  by ultrasound is recommended for people who have history of high blood pressure or who are current or former smokers.

## 2014-10-16 LAB — MICROALBUMIN / CREATININE URINE RATIO
Creatinine, Urine: 80.5 mg/dL
Microalb Creat Ratio: 206.2 mg/g — ABNORMAL HIGH (ref 0.0–30.0)
Microalb, Ur: 16.6 mg/dL — ABNORMAL HIGH (ref <2.0)

## 2014-10-16 LAB — URINALYSIS, MICROSCOPIC ONLY
Bacteria, UA: NONE SEEN [HPF]
CASTS: NONE SEEN [LPF]
Crystals: NONE SEEN [HPF]
RBC / HPF: NONE SEEN RBC/HPF (ref <=2)
WBC, UA: NONE SEEN WBC/HPF (ref <=5)
Yeast: NONE SEEN [HPF]

## 2014-10-16 LAB — HEMOGLOBIN A1C
Hgb A1c MFr Bld: 5.6 % (ref <5.7)
Mean Plasma Glucose: 114 mg/dL (ref <117)

## 2014-10-16 LAB — INSULIN, RANDOM: INSULIN: 8.4 u[IU]/mL (ref 2.0–19.6)

## 2014-10-16 LAB — VITAMIN D 25 HYDROXY (VIT D DEFICIENCY, FRACTURES): Vit D, 25-Hydroxy: 45 ng/mL (ref 30–100)

## 2014-10-19 ENCOUNTER — Other Ambulatory Visit: Payer: Self-pay | Admitting: *Deleted

## 2014-10-19 ENCOUNTER — Other Ambulatory Visit: Payer: Self-pay | Admitting: Internal Medicine

## 2014-10-19 MED ORDER — EZETIMIBE 10 MG PO TABS: ORAL_TABLET | ORAL | Status: DC

## 2014-10-29 LAB — HM COLONOSCOPY

## 2014-11-17 ENCOUNTER — Other Ambulatory Visit: Payer: Self-pay | Admitting: Gynecology

## 2014-11-18 LAB — CYTOLOGY - PAP

## 2014-11-19 ENCOUNTER — Ambulatory Visit (INDEPENDENT_AMBULATORY_CARE_PROVIDER_SITE_OTHER): Payer: Medicare Other | Admitting: Physician Assistant

## 2014-11-19 ENCOUNTER — Encounter: Payer: Self-pay | Admitting: Physician Assistant

## 2014-11-19 VITALS — BP 132/70 | HR 71 | Temp 97.3°F | Resp 16 | Ht 65.5 in | Wt 210.6 lb

## 2014-11-19 DIAGNOSIS — K253 Acute gastric ulcer without hemorrhage or perforation: Secondary | ICD-10-CM | POA: Diagnosis not present

## 2014-11-19 DIAGNOSIS — J069 Acute upper respiratory infection, unspecified: Secondary | ICD-10-CM

## 2014-11-19 DIAGNOSIS — K259 Gastric ulcer, unspecified as acute or chronic, without hemorrhage or perforation: Secondary | ICD-10-CM

## 2014-11-19 HISTORY — DX: Gastric ulcer, unspecified as acute or chronic, without hemorrhage or perforation: K25.9

## 2014-11-19 MED ORDER — AZITHROMYCIN 250 MG PO TABS: ORAL_TABLET | ORAL | Status: AC

## 2014-11-19 NOTE — Patient Instructions (Signed)
I will give you a prescription for an antibiotic, but please only take it if you are not feeling better in 7-10 days.  Bronchitis is mostly caused by viruses and the antibiotic will do nothing.  PLEASE TRY TO DO OVER THE COUNTER TREATMENT AND/OR PREDNISONE FOR 5-7 DAYS AND IF YOU ARE NOT GETTING BETTER OR GETTING WORSE THEN YOU CAN START ON AN ANTIBIOTIC GIVEN.  Can take the prednisone AT NIGHT WITH DINNER, it take 8-12 hours to start working so it will NOT affect your sleeping if you take it at night with your food!! Take two pills the first night and 1 or two pill the second night and then 1 pill the other nights.    Rest and stay hydrated.  Make sure you drink plenty of fluids to make sure urine is clear when you urinate.  Water will help thin out mucous. - Take Mucinex DM- Maximum Strength over the counter to thin out and cough up the thick mucous.  Please follow directions on box. -Take Albuterol if prescribed.  Risk of antibiotic use: About 1 in 4 people who take antibiotics have side effects including stomach problems, dizziness, or rashes. Those problems clear up soon after stopping the drugs, but in rare cases antibiotics can cause severe allergic reaction. Over use of antibiotics also encourages the growth of bacteria that can't be controlled easily with drugs. That makes you more vunerable to antibiotic-resistant infections and undermines the benefits of antibiotics for others.   Waste of Money: Antibiotics often aren't very expensive, but any money spent on unnecessary drugs is money down the drain.   When are antibiotics needed? Only when symptoms last longer than a week.  Start to improve but then worsen again  Please call the office or message through My Chart if you have any questions.   Acute Bronchitis Bronchitis is when the airways that extend from the windpipe into the lungs get red, puffy, and painful (inflamed). Bronchitis often causes thick spit (mucus) to develop. This  leads to a cough. A cough is the most common symptom of bronchitis. In acute bronchitis, the condition usually begins suddenly and goes away over time (usually in 2 weeks). Smoking, allergies, and asthma can make bronchitis worse. Repeated episodes of bronchitis may cause more lung problems.  Most common cause of Bronchitis is viruses (rhinovirus, coronavirus, RSV).  Therefore, not requiring an antibiotic; as antibiotics only treat bacterial infections.  HOME CARE  Rest.  Drink enough fluids to keep your pee (urine) clear or pale yellow (unless you need to limit fluids as told by your doctor).  Only take over-the-counter or prescription medicines as told by your doctor.  Avoid smoking and secondhand smoke. These can make bronchitis worse. If you are a smoker, think about using nicotine gum or skin patches. Quitting smoking will help your lungs heal faster.  Reduce the chance of getting bronchitis again by:  Washing your hands often.  Avoiding people with cold symptoms.  Trying not to touch your hands to your mouth, nose, or eyes.  Follow up with your doctor as told. GET HELP IF: Your symptoms do not improve after 1 week of treatment. Symptoms include:  Cough.  Fever.  Coughing up thick spit.  Body aches.  Chest congestion.  Chills.  Shortness of breath.  Sore throat. GET HELP RIGHT AWAY IF:   You have an increased fever.  You have chills.  You have severe shortness of breath.  You have bloody thick spit (sputum).    You throw up (vomit) often.  You lose too much body fluid (dehydration).  You have a severe headache.  You faint. MAKE SURE YOU:   Understand these instructions.  Will watch your condition.  Will get help right away if you are not doing well or get worse. Document Released: 08/09/2007 Document Revised: 10/23/2012 Document Reviewed: 08/13/2012 [employer] Patient Information 2015 [employer]. This information is not intended to replace  advice given to you by your health care provider. Make sure you discuss any questions you have with your health care provider.   

## 2014-11-19 NOTE — Progress Notes (Signed)
   Subjective:    Patient ID: Deanna Gomez, female    DOB: Aug 26, 1945, 69 y.o.   MRN: [id number]  HPI 69 y.o. obese, history of smoking Q 2010 WF with history of HTN, chol, preDM presents with cold symptoms. She went to daughter's wedding last week, came back, had sore throat, now with sinus drainage, productive cough with green mucus and blood. She has felt chills but no subjective fevers.   She had EGD and colonoscopy with Dr. Enis Gash gastric ulcer, she is off ASA and on dexilant.  Last CXR 01/2014  Blood pressure 132/70, pulse 71, temperature 97.3 F (36.3 C), resp. rate 16, weight 210 lb 9.6 oz (95.528 kg).  Current Outpatient Prescriptions on File Prior to Visit  Medication Sig Dispense Refill  . ALPRAZolam (XANAX) 0.5 MG tablet Take 1/2 to 1 tablet up to 3 x d ay if needed for anxiety or sleep 90 tablet 5  . aspirin 81 MG tablet Take 81 mg by mouth daily.    . bisoprolol-hydrochlorothiazide (ZIAC) 5-6.25 MG per tablet TAKE 1 TABLET BY MOUTH DAILY FOR BP 90 tablet 1  . ezetimibe (ZETIA) 10 MG tablet TAKE 1 TABLET (10 MG TOTAL) BY MOUTH DAILY. FOR CHOLESTEROL 90 tablet 4  . losartan (COZAAR) 100 MG tablet TAKE 1 TABLET (100 MG TOTAL) BY MOUTH DAILY. 90 tablet 1  . Magnesium Oxide (MAG-CAPS PO) Take by mouth. 400 MG DAILY    . pravastatin (PRAVACHOL) 40 MG tablet TAKE 1 TABLET (40 MG TOTAL) BY MOUTH DAILY FOR CHOLESTEROL 90 tablet 1   No current facility-administered medications on file prior to visit.   Past Medical History  Diagnosis Date  . HTN (hypertension)   . Hyperlipidemia   . Scoliosis   . Hepatitis C   . Obesity   . Tobacco use     stopped 6 years ago.  . Pre-diabetes   . Vitamin D deficiency     Review of Systems  Constitutional: Negative for chills and diaphoresis.  HENT: Positive for congestion, postnasal drip, sinus pressure and sneezing. Negative for ear pain and sore throat.   Respiratory: Positive for cough and wheezing. Negative for chest  tightness and shortness of breath.   Cardiovascular: Negative.   Gastrointestinal: Negative.   Genitourinary: Negative.   Musculoskeletal: Negative for neck pain.  Neurological: Positive for headaches.       Objective:   Physical Exam  Constitutional: She appears well-developed and well-nourished.  HENT:  Head: Normocephalic and atraumatic.  Right Ear: External ear normal.  Nose: Right sinus exhibits maxillary sinus tenderness. Right sinus exhibits no frontal sinus tenderness. Left sinus exhibits maxillary sinus tenderness. Left sinus exhibits no frontal sinus tenderness.  Eyes: Conjunctivae and EOM are normal.  Neck: Normal range of motion. Neck supple.  Cardiovascular: Normal rate, regular rhythm, normal heart sounds and intact distal pulses.   Pulmonary/Chest: Effort normal and breath sounds normal. No respiratory distress. She has no wheezes.  Abdominal: Soft. Bowel sounds are normal.  Lymphadenopathy:    She has cervical adenopathy.  Skin: Skin is warm and dry.      Assessment & Plan:  URI- Will hold the zpak and take if she is not getting better, increase fluids, rest, cont allergy pill, no prednisone due to recent ulcer

## 2015-01-11 ENCOUNTER — Other Ambulatory Visit: Payer: Self-pay | Admitting: Internal Medicine

## 2015-01-24 ENCOUNTER — Encounter: Payer: Self-pay | Admitting: *Deleted

## 2015-01-25 ENCOUNTER — Encounter: Payer: Self-pay | Admitting: Internal Medicine

## 2015-01-25 ENCOUNTER — Ambulatory Visit (INDEPENDENT_AMBULATORY_CARE_PROVIDER_SITE_OTHER): Payer: Medicare Other | Admitting: Internal Medicine

## 2015-01-25 VITALS — BP 132/86 | HR 68 | Temp 98.3°F | Resp 16 | Ht 65.5 in | Wt 209.6 lb

## 2015-01-25 DIAGNOSIS — I10 Essential (primary) hypertension: Secondary | ICD-10-CM

## 2015-01-25 DIAGNOSIS — Z79899 Other long term (current) drug therapy: Secondary | ICD-10-CM | POA: Diagnosis not present

## 2015-01-25 DIAGNOSIS — E559 Vitamin D deficiency, unspecified: Secondary | ICD-10-CM | POA: Diagnosis not present

## 2015-01-25 DIAGNOSIS — E782 Mixed hyperlipidemia: Secondary | ICD-10-CM

## 2015-01-25 DIAGNOSIS — R7303 Prediabetes: Secondary | ICD-10-CM

## 2015-01-25 LAB — LIPID PANEL
CHOLESTEROL: 236 mg/dL — AB (ref 125–200)
HDL: 102 mg/dL (ref >=46)
LDL Cholesterol: 117 mg/dL (ref <130)
Total CHOL/HDL Ratio: 2.3 Ratio (ref <=5.0)
Triglycerides: 85 mg/dL (ref <150)
VLDL: 17 mg/dL (ref <30)

## 2015-01-25 LAB — CBC WITH DIFFERENTIAL/PLATELET
BASOS PCT: 0 % (ref 0–1)
Basophils Absolute: 0 10*3/uL (ref 0.0–0.1)
EOS ABS: 0 10*3/uL (ref 0.0–0.7)
EOS PCT: 0 % (ref 0–5)
HEMATOCRIT: 40.4 % (ref 36.0–46.0)
Hemoglobin: 14.3 g/dL (ref 12.0–15.0)
LYMPHS PCT: 18 % (ref 12–46)
Lymphs Abs: 1.3 10*3/uL (ref 0.7–4.0)
MCH: 31.6 pg (ref 26.0–34.0)
MCHC: 35.4 g/dL (ref 30.0–36.0)
MCV: 89.4 fL (ref 78.0–100.0)
MONO ABS: 0.5 10*3/uL (ref 0.1–1.0)
MPV: 9.1 fL (ref 8.6–12.4)
Monocytes Relative: 7 % (ref 3–12)
Neutro Abs: 5.3 10*3/uL (ref 1.7–7.7)
Neutrophils Relative %: 75 % (ref 43–77)
PLATELETS: 273 10*3/uL (ref 150–400)
RBC: 4.52 MIL/uL (ref 3.87–5.11)
RDW: 13.5 % (ref 11.5–15.5)
WBC: 7 10*3/uL (ref 4.0–10.5)

## 2015-01-25 LAB — HEPATIC FUNCTION PANEL
ALBUMIN: 4.5 g/dL (ref 3.6–5.1)
ALT: 16 U/L (ref 6–29)
AST: 23 U/L (ref 10–35)
Alkaline Phosphatase: 61 U/L (ref 33–130)
BILIRUBIN INDIRECT: 0.7 mg/dL (ref 0.2–1.2)
BILIRUBIN TOTAL: 0.8 mg/dL (ref 0.2–1.2)
Bilirubin, Direct: 0.1 mg/dL (ref <=0.2)
TOTAL PROTEIN: 7.3 g/dL (ref 6.1–8.1)

## 2015-01-25 LAB — BASIC METABOLIC PANEL WITH GFR
BUN: 11 mg/dL (ref 7–25)
CALCIUM: 10.2 mg/dL (ref 8.6–10.4)
CO2: 30 mmol/L (ref 20–31)
Chloride: 91 mmol/L — ABNORMAL LOW (ref 98–110)
Creat: 0.79 mg/dL (ref 0.60–0.93)
GFR, EST AFRICAN AMERICAN: 87 mL/min (ref >=60)
GFR, EST NON AFRICAN AMERICAN: 76 mL/min (ref >=60)
GLUCOSE: 105 mg/dL — AB (ref 65–99)
Potassium: 4.6 mmol/L (ref 3.5–5.3)
Sodium: 133 mmol/L — ABNORMAL LOW (ref 135–146)

## 2015-01-25 LAB — HEMOGLOBIN A1C
Hgb A1c MFr Bld: 5.8 % — ABNORMAL HIGH (ref <5.7)
MEAN PLASMA GLUCOSE: 120 mg/dL — AB (ref <117)

## 2015-01-25 LAB — TSH: TSH: 2.075 u[IU]/mL (ref 0.350–4.500)

## 2015-01-25 NOTE — Progress Notes (Signed)
Patient ID: Deanna Gomez, female   DOB: 06/18/1945, 69 y.o.   MRN: [id number]  Assessment and Plan:  Hypertension:  -Continue medication,  -monitor blood pressure at home.  -Continue DASH diet.   -Reminder to go to the ER if any CP, SOB, nausea, dizziness, severe HA, changes vision/speech, left arm numbness and tingling, and jaw pain.  Cholesterol: -Continue diet and exercise.  -Check cholesterol.   Pre-diabetes: -Continue diet and exercise.  -Check A1C  Vitamin D Def: -check level -continue medications.   Anxiety -currently well controlled on xanax -if gets worse consider SSRI  Continue diet and meds as discussed. Further disposition pending results of labs.  HPI 69 y.o. female  presents for 3 month follow up with hypertension, hyperlipidemia, prediabetes and vitamin D.   Her blood pressure has been controlled at home, today their BP is BP: 132/86 mmHg.   She does workout. She denies chest pain, shortness of breath, dizziness.   She is on cholesterol medication and denies myalgias. Her cholesterol is at goal. The cholesterol last visit was:   Lab Results  Component Value Date   CHOL 212* 10/15/2014   HDL 85 10/15/2014   LDLCALC 107 10/15/2014   TRIG 101 10/15/2014   CHOLHDL 2.5 10/15/2014     She has been working on diet and exercise for prediabetes, and denies foot ulcerations, hyperglycemia, hypoglycemia , increased appetite, nausea, paresthesia of the feet, polydipsia, polyuria, visual disturbances, vomiting and weight loss. Last A1C in the office was:  Lab Results  Component Value Date   HGBA1C 5.6 10/15/2014    Patient is on Vitamin D supplement.  Lab Results  Component Value Date   VD25OH 45 10/15/2014     She recently had a repeat endoscopy and her ulcer in her stomach has improved.  She reports that Dr. Kinnie ScalesMedoff is due to repeat her colonoscopy in 5 years.   She has been under a lot of stress secondary to her daughters addiction problems and  homelessness.  She reports that she is living in a shelter.  She has finished rehab.     Current Medications:  Current Outpatient Prescriptions on File Prior to Visit  Medication Sig Dispense Refill  . ALPRAZolam (XANAX) 0.5 MG tablet Take 1/2 to 1 tablet up to 3 x d ay if needed for anxiety or sleep 90 tablet 5  . bisoprolol-hydrochlorothiazide (ZIAC) 5-6.25 MG tablet TAKE 1 TABLET BY MOUTH DAILY FOR BLOOD PRESSURE 90 tablet 1  . ezetimibe (ZETIA) 10 MG tablet TAKE 1 TABLET (10 MG TOTAL) BY MOUTH DAILY. FOR CHOLESTEROL 90 tablet 4  . losartan (COZAAR) 100 MG tablet TAKE 1 TABLET (100 MG TOTAL) BY MOUTH DAILY. 90 tablet 1  . Magnesium Oxide (MAG-CAPS PO) Take by mouth. 400 MG DAILY    . pravastatin (PRAVACHOL) 40 MG tablet TAKE 1 TABLET (40 MG TOTAL) BY MOUTH DAILY FOR CHOLESTEROL 90 tablet 1   No current facility-administered medications on file prior to visit.    Medical History:  Past Medical History  Diagnosis Date  . HTN (hypertension)   . Hyperlipidemia   . Scoliosis   . Hepatitis C   . Obesity   . Tobacco use     stopped 6 years ago.  . Pre-diabetes   . Vitamin D deficiency     Allergies: No Known Allergies   Review of Systems:  Review of Systems  Constitutional: Negative for fever, chills and malaise/fatigue.  HENT: Negative for congestion, ear pain and sore throat.  Eyes: Negative.   Respiratory: Negative for cough, shortness of breath and wheezing.   Cardiovascular: Negative for chest pain, palpitations and leg swelling.  Gastrointestinal: Negative for heartburn, diarrhea, constipation, blood in stool and melena.  Genitourinary: Negative.   Skin: Negative.   Neurological: Negative for dizziness, sensory change and headaches.  Psychiatric/Behavioral: Negative for depression. The patient is not nervous/anxious and does not have insomnia.     Family history- Review and unchanged  Social history- Review and unchanged  Physical Exam: BP 132/86 mmHg  Pulse 68   Temp(Src) 98.3 F (36.8 C)  Resp 16  Ht 5' 5.5" (1.664 m)  Wt 209 lb 9.6 oz (95.074 kg)  BMI 34.34 kg/m2 Wt Readings from Last 3 Encounters:  01/25/15 209 lb 9.6 oz (95.074 kg)  11/19/14 210 lb 9.6 oz (95.528 kg)  10/15/14 207 lb 3.2 oz (93.985 kg)    General Appearance: Well nourished well developed, in no apparent distress. Eyes: PERRLA, EOMs, conjunctiva no swelling or erythema ENT/Mouth: Ear canals normal without obstruction, swelling, erythma, discharge.  TMs normal bilaterally.  Oropharynx moist, clear, without exudate, or postoropharyngeal swelling. Neck: Supple, thyroid normal,no cervical adenopathy  Respiratory: Respiratory effort normal, Breath sounds clear A&P without rhonchi, wheeze, or rale.  No retractions, no accessory usage. Cardio: RRR with no MRGs. Brisk peripheral pulses without edema.  Abdomen: Soft, + BS,  Non tender, no guarding, rebound, hernias, masses. Musculoskeletal: Full ROM, 5/5 strength, Normal gait Skin: Warm, dry without rashes, lesions, ecchymosis.  Neuro: Awake and oriented X 3, Cranial nerves intact. Normal muscle tone, no cerebellar symptoms. Psych: Normal affect, Insight and Judgment appropriate.    Terri Piedra, PA-C 11:14 AM [facility]

## 2015-03-01 ENCOUNTER — Encounter: Payer: Self-pay | Admitting: *Deleted

## 2015-04-20 ENCOUNTER — Other Ambulatory Visit: Payer: Self-pay | Admitting: *Deleted

## 2015-04-20 MED ORDER — PRAVASTATIN SODIUM 40 MG PO TABS: ORAL_TABLET | ORAL | Status: DC

## 2015-04-20 MED ORDER — LOSARTAN POTASSIUM 100 MG PO TABS: ORAL_TABLET | ORAL | Status: DC

## 2015-04-20 MED ORDER — BISOPROLOL-HYDROCHLOROTHIAZIDE 5-6.25 MG PO TABS: 1.0000 | ORAL_TABLET | Freq: Every day | ORAL | Status: DC

## 2015-04-29 ENCOUNTER — Ambulatory Visit (INDEPENDENT_AMBULATORY_CARE_PROVIDER_SITE_OTHER): Payer: Medicare Other | Admitting: Internal Medicine

## 2015-04-29 ENCOUNTER — Encounter: Payer: Self-pay | Admitting: Internal Medicine

## 2015-04-29 VITALS — BP 132/80 | HR 72 | Temp 97.4°F | Resp 16 | Ht 65.5 in | Wt 216.6 lb

## 2015-04-29 DIAGNOSIS — I10 Essential (primary) hypertension: Secondary | ICD-10-CM | POA: Diagnosis not present

## 2015-04-29 DIAGNOSIS — E559 Vitamin D deficiency, unspecified: Secondary | ICD-10-CM

## 2015-04-29 DIAGNOSIS — R7303 Prediabetes: Secondary | ICD-10-CM

## 2015-04-29 DIAGNOSIS — Z79899 Other long term (current) drug therapy: Secondary | ICD-10-CM

## 2015-04-29 DIAGNOSIS — E782 Mixed hyperlipidemia: Secondary | ICD-10-CM | POA: Diagnosis not present

## 2015-04-29 LAB — CBC WITH DIFFERENTIAL/PLATELET
BASOS ABS: 0 10*3/uL (ref 0.0–0.1)
BASOS PCT: 0 % (ref 0–1)
EOS PCT: 1 % (ref 0–5)
Eosinophils Absolute: 0.1 10*3/uL (ref 0.0–0.7)
HCT: 43.5 % (ref 36.0–46.0)
Hemoglobin: 14.6 g/dL (ref 12.0–15.0)
LYMPHS PCT: 19 % (ref 12–46)
Lymphs Abs: 1.3 10*3/uL (ref 0.7–4.0)
MCH: 31.1 pg (ref 26.0–34.0)
MCHC: 33.6 g/dL (ref 30.0–36.0)
MCV: 92.8 fL (ref 78.0–100.0)
MONO ABS: 0.5 10*3/uL (ref 0.1–1.0)
MPV: 9.5 fL (ref 8.6–12.4)
Monocytes Relative: 8 % (ref 3–12)
NEUTROS ABS: 4.8 10*3/uL (ref 1.7–7.7)
Neutrophils Relative %: 72 % (ref 43–77)
Platelets: 262 10*3/uL (ref 150–400)
RBC: 4.69 MIL/uL (ref 3.87–5.11)
RDW: 13.4 % (ref 11.5–15.5)
WBC: 6.6 10*3/uL (ref 4.0–10.5)

## 2015-04-29 LAB — BASIC METABOLIC PANEL WITH GFR
BUN: 11 mg/dL (ref 7–25)
CALCIUM: 10 mg/dL (ref 8.6–10.4)
CO2: 28 mmol/L (ref 20–31)
CREATININE: 0.92 mg/dL (ref 0.60–0.93)
Chloride: 94 mmol/L — ABNORMAL LOW (ref 98–110)
GFR, EST NON AFRICAN AMERICAN: 63 mL/min (ref >=60)
GFR, Est African American: 72 mL/min (ref >=60)
GLUCOSE: 115 mg/dL — AB (ref 65–99)
POTASSIUM: 4.6 mmol/L (ref 3.5–5.3)
SODIUM: 135 mmol/L (ref 135–146)

## 2015-04-29 LAB — HEPATIC FUNCTION PANEL
ALBUMIN: 4.5 g/dL (ref 3.6–5.1)
ALT: 29 U/L (ref 6–29)
AST: 36 U/L — ABNORMAL HIGH (ref 10–35)
Alkaline Phosphatase: 57 U/L (ref 33–130)
Bilirubin, Direct: 0.2 mg/dL (ref <=0.2)
Indirect Bilirubin: 0.5 mg/dL (ref 0.2–1.2)
TOTAL PROTEIN: 7.4 g/dL (ref 6.1–8.1)
Total Bilirubin: 0.7 mg/dL (ref 0.2–1.2)

## 2015-04-29 LAB — LIPID PANEL
CHOLESTEROL: 245 mg/dL — AB (ref 125–200)
HDL: 92 mg/dL (ref >=46)
LDL Cholesterol: 130 mg/dL — ABNORMAL HIGH (ref <130)
Total CHOL/HDL Ratio: 2.7 Ratio (ref <=5.0)
Triglycerides: 116 mg/dL (ref <150)
VLDL: 23 mg/dL (ref <30)

## 2015-04-29 LAB — TSH: TSH: 2.36 mIU/L

## 2015-04-29 LAB — MAGNESIUM: MAGNESIUM: 1.4 mg/dL — AB (ref 1.5–2.5)

## 2015-04-29 NOTE — Progress Notes (Signed)
Patient ID: Deanna Gomez, female   DOB: Feb 16, 1946, 70 y.o.   MRN: [id number]   This very nice 70 y.o. MWF presents for 6 month follow up with Hypertension, Hyperlipidemia, Pre-Diabetes and Vitamin D Deficiency.  Patient also has Chronic LBP from severe thoracolumbar scoliosis for which she has undergone DDD fusion surgery  X 3.    Patient is treated for HTN circa 1996  & BP has been controlled at home. Today's BP: 132/80 mmHg. Patient also has CKD 3 with GFR 61 ml/min attributed to HT nephrosclerosis.  Patient has had no complaints of any cardiac type chest pain, palpitations, dyspnea/orthopnea/PND, dizziness, claudication, or dependent edema.   Hyperlipidemia is not controlled with diet & meds. Patient denies myalgias or other med SE's. Last Lipids were not at goal with  Cholesterol 236*; HDL 102; LDL 117; and Triglycerides 85 on 01/25/2015:   Also, the patient has history of PreDiabetes with A1c 5.7% in 2012 & 5.9% in 2013 & 2014 and has had no symptoms of reactive hypoglycemia, diabetic polys, paresthesias or visual blurring.  Last A1c was 5.8% on 01/25/2015.    Further, the patient also has history of Vitamin D Deficiency of "12" in 2008 and supplements vitamin D without any suspected side-effects. Last vitamin D was 45 on 10/15/2014.    Medication Sig  . ALPRAZolam (XANAX) 0.5 MG tablet Take 1/2 to 1 tablet up to 3 x d ay if needed for anxiety or sleep  . bisoprolol-hydrochlorothiazide (ZIAC) 5-6.25 MG tablet Take 1 tablet by mouth daily. for blood pressure  . ezetimibe (ZETIA) 10 MG tablet TAKE 1 TABLET (10 MG TOTAL) BY MOUTH DAILY. FOR CHOLESTEROL  . losartan (COZAAR) 100 MG tablet TAKE 1 TABLET (100 MG TOTAL) BY MOUTH DAILY.  . Magnesium Oxide (MAG-CAPS PO) Take by mouth. 400 MG DAILY  . pravastatin (PRAVACHOL) 40 MG tablet TAKE 1 TABLET (40 MG TOTAL) BY MOUTH DAILY FOR CHOLESTEROL   Allergies  Allergen Reactions  . Losartan Hives  . Penicillins Nausea And Vomiting and Hives    PMHx:   Past Medical History  Diagnosis Date  . HTN (hypertension)   . Hyperlipidemia   . Scoliosis   . Hepatitis C   . Obesity   . Tobacco use     stopped 6 years ago.  . Pre-diabetes   . Vitamin D deficiency    Immunization History  Administered Date(s) Administered  . DTaP 03/06/2006  . Influenza Split 11/20/2013, 12/24/2014  . Influenza Whole 12/19/2012  . Pneumococcal Conjugate-13 05/13/2014  . Typhoid Live 03/06/2004  . Zoster 06/11/2007   Past Surgical History  Procedure Laterality Date  . Back surgery  1986    x 3  . Breast reconstruction      x 2  . Cataract extraction      Bilateral   FHx:    Reviewed / unchanged  SHx:    Reviewed / unchanged  Systems Review:  Constitutional: Denies fever, chills, wt changes, headaches, insomnia, fatigue, night sweats, change in appetite. Eyes: Denies redness, blurred vision, diplopia, discharge, itchy, watery eyes.  ENT: Denies discharge, congestion, post nasal drip, epistaxis, sore throat, earache, hearing loss, dental pain, tinnitus, vertigo, sinus pain, snoring.  CV: Denies chest pain, palpitations, irregular heartbeat, syncope, dyspnea, diaphoresis, orthopnea, PND, claudication or edema. Respiratory: denies cough, dyspnea, DOE, pleurisy, hoarseness, laryngitis, wheezing.  Gastrointestinal: Denies dysphagia, odynophagia, heartburn, reflux, water brash, abdominal pain or cramps, nausea, vomiting, bloating, diarrhea, constipation, hematemesis, melena, hematochezia  or hemorrhoids. Genitourinary: Denies  dysuria, frequency, urgency, nocturia, hesitancy, discharge, hematuria or flank pain. Musculoskeletal: Denies arthralgias, myalgias, stiffness, jt. swelling, pain, limping or strain/sprain.  Skin: Denies pruritus, rash, hives, warts, acne, eczema or change in skin lesion(s). Neuro: No weakness, tremor, incoordination, spasms, paresthesia or pain. Psychiatric: Denies confusion, memory loss or sensory loss. Endo: Denies  change in weight, skin or hair change.  Heme/Lymph: No excessive bleeding, bruising or enlarged lymph nodes.  Physical Exam  BP 132/80 mmHg  Pulse 72  Temp(Src) 97.4 F (36.3 C)  Resp 16  Ht 5' 5.5" (1.664 m)  Wt 216 lb 9.6 oz (98.249 kg)  BMI 35.48 kg/m2  Appears well nourished and in no distress. Eyes: PERRLA, EOMs, conjunctiva no swelling or erythema. Sinuses: No frontal/maxillary tenderness ENT/Mouth: EAC's clear, TM's nl w/o erythema, bulging. Nares clear w/o erythema, swelling, exudates. Oropharynx clear without erythema or exudates. Oral hygiene is good. Tongue normal, non obstructing. Hearing intact.  Neck: Supple. Thyroid nl. Car 2+/2+ without bruits, nodes or JVD. Chest: Respirations nl with BS clear & equal w/o rales, rhonchi, wheezing or stridor.  Cor: Heart sounds normal w/ regular rate and rhythm without sig. murmurs, gallops, clicks, or rubs. Peripheral pulses normal and equal  without edema.  Abdomen: Soft & bowel sounds normal. Non-tender w/o guarding, rebound, hernias, masses, or organomegaly.  Lymphatics: Unremarkable.  Musculoskeletal: Full ROM all peripheral extremities, joint stability, 5/5 strength, and normal gait.  Skin: Warm, dry without exposed rashes, lesions or ecchymosis apparent.  Neuro: Cranial nerves intact, reflexes equal bilaterally. Sensory-motor testing grossly intact. Tendon reflexes grossly intact.  Pysch: Alert & oriented x 3.  Insight and judgement nl & appropriate. No ideations.  Assessment and Plan:  1. Essential hypertension  - TSH  2. Hyperlipidemia  - Lipid panel - TSH  3. Prediabetes  - Hemoglobin A1c - Insulin, random  4. Vitamin D deficiency  - VITAMIN D 25 Hydroxy   5. Medication management  - CBC with Differential/Platelet - BASIC METABOLIC PANEL WITH GFR - Hepatic function panel - Magnesium   Recommended regular exercise, BP monitoring, weight control, and discussed med and SE's. Recommended labs to assess and  monitor clinical status. Further disposition pending results of labs. Over 30 minutes of exam, counseling, chart review was performed

## 2015-04-29 NOTE — Patient Instructions (Signed)

## 2015-04-30 ENCOUNTER — Other Ambulatory Visit: Payer: Self-pay | Admitting: Internal Medicine

## 2015-04-30 ENCOUNTER — Other Ambulatory Visit: Payer: Self-pay | Admitting: *Deleted

## 2015-04-30 DIAGNOSIS — E785 Hyperlipidemia, unspecified: Secondary | ICD-10-CM

## 2015-04-30 DIAGNOSIS — G4700 Insomnia, unspecified: Secondary | ICD-10-CM

## 2015-04-30 LAB — VITAMIN D 25 HYDROXY (VIT D DEFICIENCY, FRACTURES): VIT D 25 HYDROXY: 54 ng/mL (ref 30–100)

## 2015-04-30 LAB — HEMOGLOBIN A1C
HEMOGLOBIN A1C: 5.6 % (ref <5.7)
MEAN PLASMA GLUCOSE: 114 mg/dL (ref <117)

## 2015-04-30 LAB — INSULIN, RANDOM: Insulin: 18 u[IU]/mL (ref 2.0–19.6)

## 2015-04-30 MED ORDER — ALPRAZOLAM 0.5 MG PO TABS: ORAL_TABLET | ORAL | Status: DC

## 2015-04-30 MED ORDER — ATORVASTATIN CALCIUM 80 MG PO TABS: ORAL_TABLET | ORAL | Status: DC

## 2015-07-30 ENCOUNTER — Ambulatory Visit: Payer: Self-pay | Admitting: Physician Assistant

## 2015-08-05 ENCOUNTER — Encounter: Payer: Self-pay | Admitting: Physician Assistant

## 2015-08-05 ENCOUNTER — Ambulatory Visit (INDEPENDENT_AMBULATORY_CARE_PROVIDER_SITE_OTHER): Payer: Medicare Other | Admitting: Physician Assistant

## 2015-08-05 VITALS — BP 136/64 | HR 81 | Temp 97.2°F | Resp 16 | Ht 65.0 in | Wt 218.6 lb

## 2015-08-05 DIAGNOSIS — B1920 Unspecified viral hepatitis C without hepatic coma: Secondary | ICD-10-CM

## 2015-08-05 DIAGNOSIS — Z0000 Encounter for general adult medical examination without abnormal findings: Secondary | ICD-10-CM

## 2015-08-05 DIAGNOSIS — R7303 Prediabetes: Secondary | ICD-10-CM

## 2015-08-05 DIAGNOSIS — B379 Candidiasis, unspecified: Secondary | ICD-10-CM

## 2015-08-05 DIAGNOSIS — R6889 Other general symptoms and signs: Secondary | ICD-10-CM | POA: Diagnosis not present

## 2015-08-05 DIAGNOSIS — I10 Essential (primary) hypertension: Secondary | ICD-10-CM | POA: Diagnosis not present

## 2015-08-05 DIAGNOSIS — E782 Mixed hyperlipidemia: Secondary | ICD-10-CM | POA: Diagnosis not present

## 2015-08-05 DIAGNOSIS — Z79899 Other long term (current) drug therapy: Secondary | ICD-10-CM

## 2015-08-05 DIAGNOSIS — E559 Vitamin D deficiency, unspecified: Secondary | ICD-10-CM | POA: Diagnosis not present

## 2015-08-05 DIAGNOSIS — K253 Acute gastric ulcer without hemorrhage or perforation: Secondary | ICD-10-CM | POA: Diagnosis not present

## 2015-08-05 DIAGNOSIS — Z0001 Encounter for general adult medical examination with abnormal findings: Secondary | ICD-10-CM

## 2015-08-05 LAB — LIPID PANEL
CHOL/HDL RATIO: 2.2 ratio (ref <=5.0)
Cholesterol: 238 mg/dL — ABNORMAL HIGH (ref 125–200)
HDL: 107 mg/dL (ref >=46)
LDL CALC: 110 mg/dL (ref <130)
TRIGLYCERIDES: 106 mg/dL (ref <150)
VLDL: 21 mg/dL (ref <30)

## 2015-08-05 LAB — CBC WITH DIFFERENTIAL/PLATELET
BASOS PCT: 1 %
Basophils Absolute: 66 cells/uL (ref 0–200)
Eosinophils Absolute: 66 cells/uL (ref 15–500)
Eosinophils Relative: 1 %
HEMATOCRIT: 43.1 % (ref 35.0–45.0)
HEMOGLOBIN: 14.5 g/dL (ref 11.7–15.5)
LYMPHS ABS: 1452 {cells}/uL (ref 850–3900)
Lymphocytes Relative: 22 %
MCH: 30.9 pg (ref 27.0–33.0)
MCHC: 33.6 g/dL (ref 32.0–36.0)
MCV: 91.7 fL (ref 80.0–100.0)
MONO ABS: 792 {cells}/uL (ref 200–950)
MPV: 9.7 fL (ref 7.5–12.5)
Monocytes Relative: 12 %
NEUTROS ABS: 4224 {cells}/uL (ref 1500–7800)
NEUTROS PCT: 64 %
Platelets: 259 10*3/uL (ref 140–400)
RBC: 4.7 MIL/uL (ref 3.80–5.10)
RDW: 13.3 % (ref 11.0–15.0)
WBC: 6.6 10*3/uL (ref 3.8–10.8)

## 2015-08-05 LAB — BASIC METABOLIC PANEL WITH GFR
BUN: 11 mg/dL (ref 7–25)
CO2: 30 mmol/L (ref 20–31)
Calcium: 10.1 mg/dL (ref 8.6–10.4)
Chloride: 94 mmol/L — ABNORMAL LOW (ref 98–110)
Creat: 0.79 mg/dL (ref 0.60–0.93)
GFR, EST NON AFRICAN AMERICAN: 75 mL/min (ref >=60)
GFR, Est African American: 86 mL/min (ref >=60)
GLUCOSE: 107 mg/dL — AB (ref 65–99)
POTASSIUM: 4.7 mmol/L (ref 3.5–5.3)
Sodium: 137 mmol/L (ref 135–146)

## 2015-08-05 LAB — HEPATIC FUNCTION PANEL
ALK PHOS: 62 U/L (ref 33–130)
ALT: 19 U/L (ref 6–29)
AST: 25 U/L (ref 10–35)
Albumin: 4.5 g/dL (ref 3.6–5.1)
BILIRUBIN DIRECT: 0.2 mg/dL (ref <=0.2)
BILIRUBIN INDIRECT: 0.8 mg/dL (ref 0.2–1.2)
TOTAL PROTEIN: 7.1 g/dL (ref 6.1–8.1)
Total Bilirubin: 1 mg/dL (ref 0.2–1.2)

## 2015-08-05 LAB — HEMOGLOBIN A1C
Hgb A1c MFr Bld: 5.6 % (ref <5.7)
MEAN PLASMA GLUCOSE: 114 mg/dL

## 2015-08-05 LAB — TSH: TSH: 3.19 mIU/L

## 2015-08-05 MED ORDER — NYSTATIN 100000 UNIT/GM EX POWD: CUTANEOUS | Status: DC

## 2015-08-05 NOTE — Progress Notes (Signed)
Patient ID: Deanna Gomez, female   DOB: 1945-10-30, 70 y.o.   MRN: [id number]   MEDICARE ANNUAL WELLNESS VISIT AND 3 MONTH  Assessment:    1. Essential hypertension -Dash diet -cont meds -cont diet and exercise - TSH  2. Prediabetes -cont at last visit with diet and exercise -cont weight loss - Hemoglobin A1c - Insulin, fasting  3. Vitamin D deficiency -cont supplement - Vit D  25 hydroxy (rtn osteoporosis monitoring)  4. Hyperlipidemia -cont meds, -at goal - Lipid panel  5. Medication management -check labs - CBC with Differential/Platelet - BASIC METABOLIC PANEL WITH GFR - Hepatic function panel - Magnesium  6. Hepatitis C virus infection without hepatic coma, unspecified chronicity -followed by GI, check LFTs  6. Acute gastric ulcer monitor  7. Morbid obesity, unspecified obesity type (HCC) Obesity with co morbidities- long discussion about weight loss, diet, and exercise  8. Medication management  9. Encounter for Medicare annual wellness exam   Plan:   During the course of the visit the patient was educated and counseled about appropriate screening and preventive services including:    Pneumococcal vaccine   Influenza vaccine  Td vaccine  Screening electrocardiogram  Bone densitometry screening  Colorectal cancer screening  Diabetes screening  Glaucoma screening  Nutrition counseling   Advanced directives: requested  Conditions/risks identified: BMI: Discussed weight loss, diet, and increase physical activity.  Increase physical activity: AHA recommends 150 minutes of physical activity a week.  Medications reviewed Diabetes is at goal, ACE/ARB therapy: Yes. Urinary Incontinence is not an issue: discussed non pharmacology and pharmacology options.  Fall risk: low- discussed PT, home fall assessment, medications.   Subjective:   Deanna CowmanSuzanne B Lillibridge is a 70 y.o. female who presents for Medicare Annual Wellness Visit and  complete physical.  Date of last medicare wellness visit was 2016.  She has had elevated blood pressure since 1996. Her blood pressure has been controlled at home, & today their BP is BP: 136/64 mmHg She does not workout currently as her husband made her quit the workout facility that she used to go to. She denies chest pain, shortness of breath, dizziness.  She is on cholesterol medication and denies myalgias. Her cholesterol is at goal. The cholesterol last visit was:  Lab Results  Component Value Date   CHOL 245* 04/29/2015   HDL 92 04/29/2015   LDLCALC 130* 04/29/2015   TRIG 116 04/29/2015   CHOLHDL 2.7 04/29/2015   She has had prediabetes for 4years (2012). She has been working on diet and exercise for prediabetes, and denies foot ulcerations, hyperglycemia, hypoglycemia , increased appetite, nausea, paresthesia of the feet, polydipsia, polyuria, visual disturbances, vomiting and weight loss. Last A1C in the office was:  Lab Results  Component Value Date   HGBA1C 5.6 04/29/2015   Patient is on Vitamin D supplement.   Lab Results  Component Value Date   VD25OH 2854 04/29/2015       She has also been taking care of her husband for his medical issues.  No anhedonia.  She is still doing things with her girlfriends.  No crying spells.   BMI is Body mass index is 36.38 kg/(m^2)., she is working on diet and exercise. Wt Readings from Last 3 Encounters:  08/05/15 218 lb 9.6 oz (99.156 kg)  04/29/15 216 lb 9.6 oz (98.249 kg)  01/25/15 209 lb 9.6 oz (95.074 kg)   Names of Other Physician/Practitioners you currently use: 1. [facility] here for  primary care 2. [place], eye doctor, last visit 2017 3. dentist, last visit 2016  Patient Care Team: Lucky Cowboy, MD as PCP - General (Internal Medicine) Sharrell Ku, MD as Consulting Physician (Gastroenterology) Teodora Medici, MD as Consulting Physician (Gynecology) Elmon Else, MD as Consulting  Physician (Dermatology) [name], DO (Ophthalmology)  Medication Review: Current Outpatient Prescriptions on File Prior to Visit  Medication Sig Dispense Refill  . ALPRAZolam (XANAX) 0.5 MG tablet Take 1/2 to 1 tablet up to 3 x d ay if needed for anxiety or sleep 90 tablet 5  . atorvastatin (LIPITOR) 80 MG tablet Take 1/2 to 1 tablet daily for Cholesterol or as directed 90 tablet 1  . bisoprolol-hydrochlorothiazide (ZIAC) 5-6.25 MG tablet Take 1 tablet by mouth daily. for blood pressure 90 tablet 1  . ezetimibe (ZETIA) 10 MG tablet TAKE 1 TABLET (10 MG TOTAL) BY MOUTH DAILY. FOR CHOLESTEROL 90 tablet 4  . losartan (COZAAR) 100 MG tablet TAKE 1 TABLET (100 MG TOTAL) BY MOUTH DAILY. 90 tablet 1  . Magnesium Oxide (MAG-CAPS PO) Take by mouth. 400 MG DAILY     No current facility-administered medications on file prior to visit.   No Known Allergies  Current Problems (verified) Patient Active Problem List   Diagnosis Date Noted  . Gastric ulcer 11/19/2014  . Morbid obesity (BMI 35.46) 10/15/2014  . Medication management 10/13/2013  . Essential hypertension 03/09/2013  . Hyperlipidemia 03/09/2013  . Prediabetes 03/09/2013  . Vitamin D deficiency 03/09/2013  . Hepatitis C virus infection 03/09/2013    Screening Tests Immunization History  Administered Date(s) Administered  . DTaP 03/06/2006  . Influenza Split 11/20/2013, 12/24/2014  . Influenza Whole 12/19/2012  . Pneumococcal Conjugate-13 05/13/2014  . Typhoid Live 03/06/2004  . Zoster 06/11/2007   Preventative care: Last colonoscopy: 10/2014 CXR 2015 [facility] 06/2014 Echo 2013 Stress test 2013 PAP 11/2014 DEXA: 2010  Needs tetanus next year, due for pneumovax, will get at [facility]  History reviewed: allergies, current medications, past family history, past medical history, past social history, past surgical history and problem list  Risk Factors: Tobacco Social History  Substance Use Topics  . Smoking status:  Former Smoker -- 1.00 packs/day for 30 years    Types: Cigarettes    Quit date: 08/28/2004  . Smokeless tobacco: None     Comment: QUIT SIX YEARS AGO  . Alcohol Use: 7.0 oz/week    14 drink(s) per week   She does not smoke.  Patient is a former smoker. Are there smokers in your home (other than you)?  No Alcohol Current alcohol use: social drinker  Caffeine Current caffeine use: denies use  Exercise Current exercise: none  Nutrition/Diet Current diet: in general, a "healthy" diet    Cardiac risk factors: advanced age (older than 30 for men, 72 for women), diabetes mellitus, dyslipidemia, hypertension and sedentary lifestyle.  Depression Screen (Note: if answer to either of the following is "Yes", a more complete depression screening is indicated)   Q1: Over the past two weeks, have you felt down, depressed or hopeless? No  Q2: Over the past two weeks, have you felt little interest or pleasure in doing things? No  Have you lost interest or pleasure in daily life? No  Do you often feel hopeless? No  Do you cry easily over simple problems? No  Activities of Daily Living In your present state of health, do you have any difficulty performing the following activities?:  Driving? No Managing money?  No Feeding yourself? No  Getting from bed to chair? No Climbing a flight of stairs? No Preparing food and eating?: No Bathing or showering? No Getting dressed: No Getting to the toilet? No Using the toilet:No Moving around from place to place: No In the past year have you fallen or had a near fall?:No  Vision Difficulties: No  Hearing Difficulties: No Do you often ask people to speak up or repeat themselves? No Do you experience ringing or noises in your ears? No Do you have difficulty understanding soft or whispered voices? Sometimes.  Cognition  Do you feel that you have a problem with memory?No  Do you often misplace items? No  Do you feel safe at home?   Yes  Advanced directives Does patient have a Health Care Power of Attorney? Yes Does patient have a Living Will? Yes  Past Surgical History  Procedure Laterality Date  . Back surgery  1986    x 3  . Breast reconstruction      x 2  . Cataract extraction      Bilateral   Family History  Problem Relation Age of Onset  . Coronary artery disease Brother   . Hypertension Mother     Objective:     BP 136/64 mmHg  Pulse 81  Temp(Src) 97.2 F (36.2 C) (Temporal)  Resp 16  Ht  (1.651 m)  Wt 218 lb 9.6 oz (99.156 kg)  BMI 36.38 kg/m2  SpO2 97%  General Appearance: Well nourished, alert, WD/WN, femaleand in no apparent distress. Eyes: PERRLA, EOMs, conjunctiva no swelling or erythema, normal fundi and vessels. Sinuses: No frontal/maxillary tenderness ENT/Mouth: EACs patent / TMs  nl. Nares clear without erythema, swelling, mucoid exudates. Oral hygiene is good. No erythema, swelling, or exudate. Tongue normal, non-obstructing. Tonsils not swollen or erythematous. Hearing normal.  Neck: Supple, thyroid normal. No bruits, nodes or JVD. Respiratory: Respiratory effort normal.  BS equal and clear bilateral without rales, rhonci, wheezing or stridor. Cardio: Heart sounds are normal with regular rate and rhythm and no murmurs, rubs or gallops. Peripheral pulses are normal and equal bilaterally without edema. No aortic or femoral bruits. Chest: symmetric with normal excursions and percussion. Breasts: Symmetric, without lumps, nipple discharge, retractions, or fibrocystic changes.  Abdomen: Flat, soft  with nl bowel sounds. Nontender, no guarding, rebound, hernias, masses, or organomegaly.  Lymphatics: Non tender without lymphadenopathy.  Genitourinary:  Musculoskeletal: Full ROM all peripheral extremities, joint stability, 5/5 strength, and normal gait. Skin: Warm and dry without rashes, lesions, cyanosis, clubbing or  ecchymosis.  Neuro: Cranial nerves intact, reflexes equal  bilaterally. Normal muscle tone, no cerebellar symptoms. Sensation intact.  Pysch: Awake and oriented X 3, normal affect, Insight and Judgment appropriate.   Cognitive Testing  Alert? Yes  Normal Appearance?Yes  Oriented to person? Yes  Place? Yes   Time? Yes  Recall of three objects?  Yes  Can perform simple calculations? Yes  Displays appropriate judgment? Yes  Can read the correct time from a watch/clock?Yes  Medicare Attestation I have personally reviewed: The patient's medical and social history Their use of alcohol, tobacco or illicit drugs Their current medications and supplements The patient's functional ability including ADLs,fall risks, home safety risks, cognitive, and hearing and visual impairment Diet and physical activities Evidence for depression or mood disorders  The patient's weight, height, BMI, and visual acuity have been recorded in the chart.  I have made referrals, counseling, and provided education to the patient based on review of the above and I have  provided the patient with a written personalized care plan for preventive services.    Quentin Mulling, PA-C   08/05/2015

## 2015-10-20 ENCOUNTER — Other Ambulatory Visit: Payer: Self-pay | Admitting: Internal Medicine

## 2015-10-26 ENCOUNTER — Encounter: Payer: Self-pay | Admitting: Internal Medicine

## 2015-11-04 ENCOUNTER — Other Ambulatory Visit: Payer: Self-pay | Admitting: Internal Medicine

## 2015-11-08 ENCOUNTER — Encounter: Payer: Self-pay | Admitting: *Deleted

## 2015-11-24 ENCOUNTER — Encounter: Payer: Self-pay | Admitting: Internal Medicine

## 2015-11-24 ENCOUNTER — Ambulatory Visit (INDEPENDENT_AMBULATORY_CARE_PROVIDER_SITE_OTHER): Payer: Medicare Other | Admitting: Internal Medicine

## 2015-11-24 VITALS — BP 124/84 | HR 64 | Temp 97.5°F | Resp 16 | Ht 65.75 in | Wt 220.2 lb

## 2015-11-24 DIAGNOSIS — Z79899 Other long term (current) drug therapy: Secondary | ICD-10-CM

## 2015-11-24 DIAGNOSIS — I10 Essential (primary) hypertension: Secondary | ICD-10-CM | POA: Diagnosis not present

## 2015-11-24 DIAGNOSIS — E559 Vitamin D deficiency, unspecified: Secondary | ICD-10-CM

## 2015-11-24 DIAGNOSIS — R7303 Prediabetes: Secondary | ICD-10-CM

## 2015-11-24 DIAGNOSIS — Z136 Encounter for screening for cardiovascular disorders: Secondary | ICD-10-CM

## 2015-11-24 DIAGNOSIS — R6889 Other general symptoms and signs: Secondary | ICD-10-CM

## 2015-11-24 DIAGNOSIS — Z1212 Encounter for screening for malignant neoplasm of rectum: Secondary | ICD-10-CM

## 2015-11-24 DIAGNOSIS — Z0001 Encounter for general adult medical examination with abnormal findings: Secondary | ICD-10-CM | POA: Diagnosis not present

## 2015-11-24 DIAGNOSIS — E782 Mixed hyperlipidemia: Secondary | ICD-10-CM

## 2015-11-24 LAB — BASIC METABOLIC PANEL WITH GFR
BUN: 11 mg/dL (ref 7–25)
CHLORIDE: 96 mmol/L — AB (ref 98–110)
CO2: 30 mmol/L (ref 20–31)
CREATININE: 0.75 mg/dL (ref 0.60–0.93)
Calcium: 9.9 mg/dL (ref 8.6–10.4)
GFR, Est African American: >89 mL/min (ref >=60)
GFR, Est Non African American: 80 mL/min (ref >=60)
Glucose, Bld: 94 mg/dL (ref 65–99)
POTASSIUM: 4.5 mmol/L (ref 3.5–5.3)
SODIUM: 136 mmol/L (ref 135–146)

## 2015-11-24 LAB — HEPATIC FUNCTION PANEL
ALBUMIN: 4.4 g/dL (ref 3.6–5.1)
ALT: 23 U/L (ref 6–29)
AST: 31 U/L (ref 10–35)
Alkaline Phosphatase: 62 U/L (ref 33–130)
Bilirubin, Direct: 0.2 mg/dL (ref <=0.2)
Indirect Bilirubin: 0.8 mg/dL (ref 0.2–1.2)
TOTAL PROTEIN: 7.2 g/dL (ref 6.1–8.1)
Total Bilirubin: 1 mg/dL (ref 0.2–1.2)

## 2015-11-24 LAB — CBC WITH DIFFERENTIAL/PLATELET
BASOS ABS: 0 {cells}/uL (ref 0–200)
BASOS PCT: 0 %
EOS ABS: 67 {cells}/uL (ref 15–500)
EOS PCT: 1 %
HCT: 41.4 % (ref 35.0–45.0)
HEMOGLOBIN: 13.9 g/dL (ref 11.7–15.5)
LYMPHS ABS: 1742 {cells}/uL (ref 850–3900)
Lymphocytes Relative: 26 %
MCH: 31.3 pg (ref 27.0–33.0)
MCHC: 33.6 g/dL (ref 32.0–36.0)
MCV: 93.2 fL (ref 80.0–100.0)
MPV: 9.6 fL (ref 7.5–12.5)
Monocytes Absolute: 670 cells/uL (ref 200–950)
Monocytes Relative: 10 %
NEUTROS ABS: 4221 {cells}/uL (ref 1500–7800)
Neutrophils Relative %: 63 %
PLATELETS: 255 10*3/uL (ref 140–400)
RBC: 4.44 MIL/uL (ref 3.80–5.10)
RDW: 13.6 % (ref 11.0–15.0)
WBC: 6.7 10*3/uL (ref 3.8–10.8)

## 2015-11-24 LAB — TSH: TSH: 3.54 m[IU]/L

## 2015-11-24 LAB — MAGNESIUM: MAGNESIUM: 1.6 mg/dL (ref 1.5–2.5)

## 2015-11-24 LAB — LIPID PANEL
Cholesterol: 199 mg/dL (ref 125–200)
HDL: 119 mg/dL (ref >=46)
LDL CALC: 62 mg/dL (ref <130)
Total CHOL/HDL Ratio: 1.7 Ratio (ref <=5.0)
Triglycerides: 92 mg/dL (ref <150)
VLDL: 18 mg/dL (ref <30)

## 2015-11-24 NOTE — Progress Notes (Signed)
[facility] Deanna Gomez, M.D.    Deanna Gomez. Steffanie Dunn, P.A.-C      Terri Piedra, P.A.-C  [facility]                [address]                [place], [place]. [address] Telephone [phone] Telefax [phone]  Annual Screening/Preventative Visit & Comprehensive Evaluation &  Examination     This very nice 70 y.o. MWF presents for a Screening/Preventative Visit & comprehensive evaluation and management of multiple medical co-morbidities.  Patient has been followed for HTN, Prediabetes, Hyperlipidemia and Vitamin D Deficiency. Patient also has Chronic LBP from severe thoracolumbar scoliosis for which she has undergone DDD fusion surgery  X 3. Patient has hx/o Hepatitis C and was treated to remission in 1987.       HTN predates since 1996. Patient's BP has been controlled at home and patient denies any cardiac symptoms as chest pain, palpitations, shortness of breath, dizziness or ankle swelling. Patient also has CKD3 (GFR 61). Today's BP is 124/84.      Patient's hyperlipidemia is not controlled with diet and medications. Patient denies myalgias or other medication SE's. Last lipids were not at goal: Lab Results  Component Value Date   CHOL 238 (H) 08/05/2015   HDL 107 08/05/2015   LDLCALC 110 08/05/2015   TRIG 106 08/05/2015   CHOLHDL 2.2 08/05/2015      Patient has Morbid Obesity (BMI 35+) and consequent prediabetes predating since 2012 with A1c 5.7%  And 5.8% in 2016. Patient denies reactive hypoglycemic symptoms, visual blurring, diabetic polys, or paresthesias. Last A1c was at goal in normal range: Lab Results  Component Value Date   HGBA1C 5.6 08/05/2015      Finally, patient has history of Vitamin D Deficiency in 2008 of "12"  and last Vitamin D was improved: Lab Results  Component Value Date   VD25OH 57 04/29/2015   Current Outpatient Prescriptions on File Prior to Visit  Medication Sig  .  ALPRAZolam  0.5 MG t Take 1/2 to 1 tab up to 3 x day if needed  . atorvastatin  80 MG  Takes 1/2 tab 3 x/wk - MWF  . bisoprolol-hctz (ZIAC) 5-6.25 TAKE 1 TAB DAILY  . ezetimibe  10 MG  TAKE 1 TAB DAILY   . Losartan 100 MG  TAKE 1 TAB DAILY.  . Magnesium 400 MG Take  DAILY  . METROCREAM 0.75 % cm   . nystatin powder Apply daily after a shower as needed   No Known Allergies   Past Medical History:  Diagnosis Date  . Hepatitis C   . HTN (hypertension)   . Hyperlipidemia   . Obesity   . Pre-diabetes   . Scoliosis   . Tobacco use    stopped 6 years ago.  . Vitamin D deficiency    Health Maintenance  Topic Date Due  . PNA vac Low Risk Adult (2 of 2 - PPSV23) 05/13/2015  . INFLUENZA VACCINE  10/05/2015  . TETANUS/TDAP  03/06/2016  . MAMMOGRAM  06/15/2017  . COLONOSCOPY  10/28/2024  . DEXA SCAN  Addressed  . ZOSTAVAX  Completed  . Hepatitis C Screening  Completed   Immunization History  Administered Date(s) Administered  . DTaP 03/06/2006  . Influenza Split 11/20/2013, 12/24/2014  . Influenza Whole 12/19/2012  . Pneumococcal Conjugate-13 05/13/2014  . Typhoid Live 03/06/2004  . Zoster 06/11/2007  Past Surgical History:  Procedure Laterality Date  . BACK SURGERY  1986   x 3  . BREAST RECONSTRUCTION     x 2  . CATARACT EXTRACTION     Bilateral   Family History  Problem Relation Age of Onset  . Coronary artery disease Brother   . Hypertension Mother    Social History  Substance Use Topics  . Smoking status: Former Smoker    Packs/day: 1.00    Years: 30.00    Types: Cigarettes    Quit date: 08/28/2004  . Smokeless tobacco: Not on file     Comment: QUIT SIX YEARS AGO  . Alcohol use 7.0 oz/week    14 drink(s) per week    ROS Constitutional: Denies fever, chills, weight loss/gain, headaches, insomnia,  night sweats, and change in appetite. Does c/o fatigue. Eyes: Denies redness, blurred vision, diplopia, discharge, itchy, watery eyes.  ENT: Denies discharge,  congestion, post nasal drip, epistaxis, sore throat, earache, hearing loss, dental pain, Tinnitus, Vertigo, Sinus pain, snoring.  Cardio: Denies chest pain, palpitations, irregular heartbeat, syncope, dyspnea, diaphoresis, orthopnea, PND, claudication, edema Respiratory: denies cough, dyspnea, DOE, pleurisy, hoarseness, laryngitis, wheezing.  Gastrointestinal: Denies dysphagia, heartburn, reflux, water brash, pain, cramps, nausea, vomiting, bloating, diarrhea, constipation, hematemesis, melena, hematochezia, jaundice, hemorrhoids Genitourinary: Denies dysuria, frequency, urgency, nocturia, hesitancy, discharge, hematuria, flank pain Breast: Breast lumps, nipple discharge, bleeding.  Musculoskeletal: Denies arthralgia, myalgia, stiffness, Jt. Swelling, pain, limp, and strain/sprain. Denies falls. Skin: Denies puritis, rash, hives, warts, acne, eczema, changing in skin lesion Neuro: No weakness, tremor, incoordination, spasms, paresthesia, pain Psychiatric: Denies confusion, memory loss, sensory loss. Denies Depression. Endocrine: Denies change in weight, skin, hair change, nocturia, and paresthesia, diabetic polys, visual blurring, hyper / hypo glycemic episodes.  Heme/Lymph: No excessive bleeding, bruising, enlarged lymph nodes.  Physical Exam  BP 124/84   Pulse 64   Temp 97.5 F (36.4 C)   Resp 16   Ht 5' 5.75" (1.67 m)   Wt 220 lb 3.2 oz (99.9 kg)   BMI 35.81 kg/m   General Appearance: Over nourished and in no apparent distress.  Eyes: PERRLA, EOMs, conjunctiva no swelling or erythema, normal fundi and vessels. Sinuses: No frontal/maxillary tenderness ENT/Mouth: EACs patent / TMs  nl. Nares clear without erythema, swelling, mucoid exudates. Oral hygiene is good. No erythema, swelling, or exudate. Tongue normal, non-obstructing. Tonsils not swollen or erythematous. Hearing normal.  Neck: Supple, thyroid normal. No bruits, nodes or JVD. Respiratory: Respiratory effort normal.  BS equal  and clear bilateral without rales, rhonci, wheezing or stridor. Cardio: Heart sounds are normal with regular rate and rhythm and no murmurs, rubs or gallops. Peripheral pulses are normal and equal bilaterally without edema. No aortic or femoral bruits. Chest: symmetric with normal excursions and percussion. Breasts: Rt Breast implant w/tatoo faux nipple and no abn masses noted bilaterally Abdomen: Flat, soft with bowel sounds active. Nontender, no guarding, rebound, hernias, masses, or organomegaly.  Lymphatics: Non tender without lymphadenopathy.  Musculoskeletal: Full ROM all peripheral extremities, joint stability, 5/5 strength, and normal gait. 16 " vertical thoraco-lumbar scar with straightening of usual lordosis. Skin: Warm and dry without rashes, lesions, cyanosis, clubbing or  ecchymosis.  Neuro: Cranial nerves intact, reflexes equal bilaterally. Normal muscle tone, no cerebellar symptoms. Sensation intact.  Pysch: Alert and oriented X 3, normal affect, Insight and Judgment appropriate.   Assessment and Plan  1. Annual Preventative Screening Examination  - Microalbumin / creatinine urine ratio - EKG 12-Lead - POC Hemoccult Bld/Stl  -  Urinalysis, Routine w reflex microscopic  - CBC with Differential/Platelet - BASIC METABOLIC PANEL WITH GFR - Hepatic function panel - Magnesium - Lipid panel - TSH - Hemoglobin A1c - Insulin, random - VITAMIN D 25 Hydroxy   2. Essential hypertension  - Microalbumin / creatinine urine ratio - EKG 12-Lead - TSH  3. Hyperlipidemia  - Lipid panel - TSH  4. Prediabetes  - Hemoglobin A1c - Insulin, random  5. Vitamin D deficiency  - VITAMIN D 25 Hydroxy   6. Morbid obesity due to excess calories (HCC)   7. Screening for rectal cancer  - POC Hemoccult Bld/Stl  8. Screening for ischemic heart disease   9. Medication management  - Urinalysis, Routine w reflex microscopic  - CBC with Differential/Platelet - BASIC METABOLIC  PANEL WITH GFR - Hepatic function panel - Magnesium      Continue prudent diet as discussed, weight control, BP monitoring, regular exercise, and medications. Discussed med's effects and SE's. Screening labs and tests as requested with regular follow-up as recommended. Over 40 minutes of exam, counseling, chart review and high complex critical decision making was performed.

## 2015-11-24 NOTE — Patient Instructions (Signed)

## 2015-11-25 LAB — URINALYSIS, ROUTINE W REFLEX MICROSCOPIC
Bilirubin Urine: NEGATIVE
GLUCOSE, UA: NEGATIVE
Hgb urine dipstick: NEGATIVE
Ketones, ur: NEGATIVE
LEUKOCYTES UA: NEGATIVE
NITRITE: NEGATIVE
PH: 7.5 (ref 5.0–8.0)
Specific Gravity, Urine: 1.011 (ref 1.001–1.035)

## 2015-11-25 LAB — INSULIN, RANDOM: INSULIN: 6.5 u[IU]/mL (ref 2.0–19.6)

## 2015-11-25 LAB — URINALYSIS, MICROSCOPIC ONLY
BACTERIA UA: NONE SEEN [HPF]
CASTS: NONE SEEN [LPF]
CRYSTALS: NONE SEEN [HPF]
RBC / HPF: NONE SEEN RBC/HPF (ref <=2)
Yeast: NONE SEEN [HPF]

## 2015-11-25 LAB — MICROALBUMIN / CREATININE URINE RATIO
Creatinine, Urine: 86 mg/dL (ref 20–320)
Microalb Creat Ratio: 490 mcg/mg creat — ABNORMAL HIGH (ref <30)
Microalb, Ur: 42.1 mg/dL

## 2015-11-25 LAB — VITAMIN D 25 HYDROXY (VIT D DEFICIENCY, FRACTURES): VIT D 25 HYDROXY: 68 ng/mL (ref 30–100)

## 2015-11-25 LAB — HEMOGLOBIN A1C
HEMOGLOBIN A1C: 5.3 % (ref <5.7)
MEAN PLASMA GLUCOSE: 105 mg/dL

## 2015-11-26 ENCOUNTER — Other Ambulatory Visit: Payer: Self-pay | Admitting: Internal Medicine

## 2015-11-26 DIAGNOSIS — M5412 Radiculopathy, cervical region: Secondary | ICD-10-CM

## 2015-12-07 ENCOUNTER — Telehealth: Payer: Self-pay | Admitting: *Deleted

## 2015-12-07 MED ORDER — AZITHROMYCIN 250 MG PO TABS: ORAL_TABLET | ORAL | 0 refills | Status: AC

## 2015-12-07 NOTE — Telephone Encounter (Signed)
Patient called and states she has sinus congestion and a cough and requested an antibiotic. Per Dr Oneta RackMcKeown, send in a Z-pak. Patient is aware and was advised to call Dr Don Perkingat's office for her neck pain and upper extremity numbness, since the referral is in EPIC and her office has tried to contact the patient.

## 2016-01-05 ENCOUNTER — Other Ambulatory Visit: Payer: Self-pay | Admitting: *Deleted

## 2016-01-05 DIAGNOSIS — G4700 Insomnia, unspecified: Secondary | ICD-10-CM

## 2016-01-05 MED ORDER — ALPRAZOLAM 0.5 MG PO TABS: ORAL_TABLET | ORAL | 5 refills | Status: DC

## 2016-02-21 ENCOUNTER — Ambulatory Visit (INDEPENDENT_AMBULATORY_CARE_PROVIDER_SITE_OTHER): Payer: Medicare Other | Admitting: Neurology

## 2016-02-21 ENCOUNTER — Encounter: Payer: Self-pay | Admitting: Neurology

## 2016-02-21 VITALS — BP 110/80 | HR 68 | Ht 65.0 in | Wt 224.3 lb

## 2016-02-21 DIAGNOSIS — G5621 Lesion of ulnar nerve, right upper limb: Secondary | ICD-10-CM

## 2016-02-21 DIAGNOSIS — R202 Paresthesia of skin: Secondary | ICD-10-CM

## 2016-02-21 NOTE — Progress Notes (Signed)
[facility] Clinic Note - Initial Visit   Date: 02/21/16  Deanna CowmanSuzanne B Trimarco MRN: [id number] DOB: 1945/08/26   Dear Dr. Oneta RackMcKeown:  Thank you for your kind referral of Deanna CowmanSuzanne B Sando for consultation of right arm numbness. Although her history is well known to you, please allow us to reiterate it for the purpose of our medical record. The patient was accompanied to the clinic by self.    History of Present Illness: Deanna Gomez is a 70 y.o. right-handed Caucasian female with hypertension, hyperlipidemia, anxiety, hepatitis C, prior tobacco use, and scoliosis s/p surgery (1984) presenting for evaluation of right arm numbness.    Since the Spring 2017, she began noticing spells of right arm numbness, which lasts < 2 minutes and involves her mostly her forearm and hand, but also involves her upper arm.  It also wakes her up from sleeping at night and she often shakes her hand to wake it up.  She is a side sleeper and tends to keep her elbows flexed.  She feels that symptoms are aggravated when she is using a stylus on her iphone with her neck ina flexed position.  She denies any weakness and is not dropping objects.  She denies any neck pain.  She has milder symptoms on the left hand.   Out-side paper records, electronic medical record, and images have been reviewed where available and summarized as:  Lab Results  Component Value Date   TSH 3.54 11/24/2015   Lab Results  Component Value Date   HGBA1C 5.3 11/24/2015    Past Medical History:  Diagnosis Date  . Hepatitis C   . HTN (hypertension)   . Hyperlipidemia   . Obesity   . Pre-diabetes   . Scoliosis   . Tobacco use    stopped 6 years ago.  . Vitamin D deficiency     Past Surgical History:  Procedure Laterality Date  . BACK SURGERY  1986   x 3  . BREAST RECONSTRUCTION     x 2  . CATARACT EXTRACTION     Bilateral     Medications:  Outpatient Encounter Prescriptions as of  02/21/2016  Medication Sig Note  . ALPRAZolam (XANAX) 0.5 MG tablet Take 1/2 to 1 tablet up to 3 x d ay if needed for anxiety or sleep   . atorvastatin (LIPITOR) 80 MG tablet Take 1/2 to 1 tablet daily for Cholesterol or as directed 11/24/2015: Takes 1/2 tablet on M,W,F  . bisoprolol-hydrochlorothiazide (ZIAC) 5-6.25 MG tablet TAKE 1 TABLET BY MOUTH DAILY  FOR BLOOD PRESSURE.   Marland Kitchen. ezetimibe (ZETIA) 10 MG tablet TAKE 1 TABLET (10 MG TOTAL) BY MOUTH DAILY FOR CHOLESTEROL   . losartan (COZAAR) 100 MG tablet TAKE 1 TABLET (100 MG TOTAL) BY MOUTH DAILY.   . Magnesium Oxide (MAG-CAPS PO) Take by mouth. 400 MG DAILY   . metroNIDAZOLE (METROCREAM) 0.75 % cream  08/05/2015: Received from: External Pharmacy  . nystatin (NYSTATIN) powder Apply daily after a shower as needed    No facility-administered encounter medications on file as of 02/21/2016.      Allergies: No Known Allergies  Family History: Family History  Problem Relation Age of Onset  . Hypertension Mother   . Coronary artery disease Brother     Social History: Social History  Substance Use Topics  . Smoking status: Former Smoker    Packs/day: 1.00    Years: 30.00    Types: Cigarettes    Quit date: 08/28/2004  .  Smokeless tobacco: Never Used     Comment: QUIT SIX YEARS AGO  . Alcohol use 7.0 oz/week    14 Standard drinks or equivalent per week   Social History   Social History Narrative   Lives with husband in a two story home.  Has 1 child and 4 stepchildren. Retired Teacher, early years/pre.  Education: Masters.         Review of Systems:  CONSTITUTIONAL: No fevers, chills, night sweats, or weight loss.   EYES: No visual changes or eye pain ENT: No hearing changes.  No history of nose bleeds.   RESPIRATORY: No cough, wheezing and shortness of breath.   CARDIOVASCULAR: Negative for chest pain, and palpitations.   GI: Negative for abdominal discomfort, blood in stools or black stools.  No recent change in bowel habits.     GU:  No history of incontinence.   MUSCLOSKELETAL: No history of joint pain or swelling.  No myalgias.   SKIN: Negative for lesions, rash, and itching.   HEMATOLOGY/ONCOLOGY: Negative for prolonged bleeding, bruising easily, and swollen nodes.  No history of cancer.   ENDOCRINE: Negative for cold or heat intolerance, polydipsia or goiter.   PSYCH:  No depression or anxiety symptoms.   NEURO: As Above.   Vital Signs:  BP 110/80   Pulse 68   Ht 5\' 5"  (1.651 m)   Wt 224 lb 5 oz (101.7 kg)   SpO2 97%   BMI 37.33 kg/m  Pain Scale: 0 on a scale of 0-10   General Medical Exam:   General:  Well appearing, comfortable.   Eyes/ENT: see cranial nerve examination.   Neck: No masses appreciated.  Full range of motion without tenderness.  No carotid bruits. Respiratory:  Clear to auscultation, good air entry bilaterally.   Cardiac:  Regular rate and rhythm, no murmur.   Extremities:  No deformities, edema, or skin discoloration.  Skin:  No rashes or lesions.  Neurological Exam: MENTAL STATUS including orientation to time, place, person, recent and remote memory, attention span and concentration, language, and fund of knowledge is normal.  Speech is not dysarthric.  CRANIAL NERVES: II:  No visual field defects.  Unremarkable fundi.   III-IV-VI: Pupils equal round and reactive to light.  Normal conjugate, extra-ocular eye movements in all directions of gaze.  No nystagmus.  No ptosis.   V:  Normal facial sensation.   VII:  Normal facial symmetry and movements. VIII:  Normal hearing and vestibular function.   IX-X:  Normal palatal movement.   XI:  Normal shoulder shrug and head rotation.   XII:  Normal tongue strength and range of motion, no deviation or fasciculation.  MOTOR:  No atrophy, fasciculations or abnormal movements.  No pronator drift.  Tone is normal.    Right Upper Extremity:    Left Upper Extremity:    Deltoid  5/5   Deltoid  5/5   Biceps  5/5   Biceps  5/5   Triceps  5/5    Triceps  5/5   Wrist extensors  5/5   Wrist extensors  5/5   Wrist flexors  5/5   Wrist flexors  5/5   Finger extensors  5/5   Finger extensors  5/5   Finger flexors  5/5   Finger flexors  5/5   Dorsal interossei  5/5   Dorsal interossei  5/5   Abductor pollicis  5/5   Abductor pollicis  5/5   Tone (Ashworth scale)  0  Tone (  Ashworth scale)  0   Right Lower Extremity:    Left Lower Extremity:    Hip flexors  5/5   Hip flexors  5/5   Hip extensors  5/5   Hip extensors  5/5   Knee flexors  5/5   Knee flexors  5/5   Knee extensors  5/5   Knee extensors  5/5   Dorsiflexors  5/5   Dorsiflexors  5/5   Plantarflexors  5/5   Plantarflexors  5/5   Toe extensors  5/5   Toe extensors  5/5   Toe flexors  5/5   Toe flexors  5/5   Tone (Ashworth scale)  0  Tone (Ashworth scale)  0   MSRs:  Right                                                                 Left brachioradialis 3+  brachioradialis 3+  biceps 3+  biceps 3+  triceps 3+  triceps 3+  patellar +  patellar 3+  ankle jerk 2+  ankle jerk 2+  Hoffman no  Hoffman no  plantar response down  plantar response down   SENSORY:  Normal and symmetric perception of light touch, pinprick, vibration, and proprioception.  Romberg's sign absent. Tinel's sign is negative at the wrist and elbow.   COORDINATION/GAIT: Normal finger-to- nose-finger and heel-to-shin.  Intact rapid alternating movements bilaterally.  Able to rise from a chair without using arms.  Gait narrow based and stable.   IMPRESSION: Mrs. Lafonda MossesKolodziey is a 70 year-old female with history of scoliosis surgery referred for evaluation of right arm numbness.  Her exam shows normal motor strength and sensation.  Reflexes are symmetrically brisk which may suggest underlying cervical canal stenosis. She denies radicular pain.  Her history is most suggestive of ulnar neuropathy at the elbow, especially since she tends to notice symptoms prolonged use of her iphone and when sleeping with arms  flexed.  NCS/EMG will be ordered to better characterize the nature of her symptoms and help differentiate cervical radiculopathy vs ulnar neuropathy. No need for neuralgesic medications as her symptoms do not occur daily. If NCS/EMG is normal, the next step is MRI cervical spine.  Further recommendations will be based on the results of her testing.    The duration of this appointment visit was 45 minutes of face-to-face time with the patient.  Greater than 50% of this time was spent in counseling, explanation of diagnosis, planning of further management, and coordination of care.   Thank you for allowing me to participate in patient's care.  If I can answer any additional questions, I would be pleased to do so.    Sincerely,    Donika K. Allena KatzPatel, DO

## 2016-02-21 NOTE — Patient Instructions (Signed)
1.  NCS/EMG of the right arm 2.  We will call you with the results and determine the next step

## 2016-03-02 ENCOUNTER — Telehealth: Payer: Self-pay | Admitting: Neurology

## 2016-03-02 ENCOUNTER — Ambulatory Visit: Payer: Medicare Other | Admitting: Neurology

## 2016-03-02 DIAGNOSIS — G5601 Carpal tunnel syndrome, right upper limb: Secondary | ICD-10-CM

## 2016-03-02 DIAGNOSIS — R202 Paresthesia of skin: Secondary | ICD-10-CM

## 2016-03-02 NOTE — Telephone Encounter (Signed)
Results of EMG discussed with patient which shows right carpal tunnel syndrome. Recommend she start using a wrist splint. If there is no improvement in 3 months, she will schedule return visit.  Jakavion Bilodeau K. Allena KatzPatel, DO

## 2016-03-02 NOTE — Procedures (Signed)
[facility]  [address], [address]  [place], [place] [address] Tel: [phone] Fax:  [phone] Test Date:  03/02/2016  Patient: Deanna KnappSuzanne Tremont DOB: January 23, 1946 Physician: Nita Sickleonika Umaiza Matusik, DO  Sex: Female Height: 5\' 5"  Ref Phys: Nita Sickleonika Baylen Dea, DO  ID#: [id number] Temp: 33.6C Technician: Judie PetitM. Dean   Patient Complaints: This is a 70 year old female referred for evaluation of right hand paresthesias.   NCV & EMG Findings: Extensive electrodiagnostic testing of the right upper extremity shows:  1. Right median sensory response shows prolonged latency (4.7 ms) and normal amplitude. Right ulnar sensory response is within normal limits. 2. Right median motor response shows prolonged distal onset latency (5.2 ms).  Right ulnar motor responses within normal limits.  3. Sparse chronic motor axon loss changes isolated to the right abductor pollicis brevis muscle, without accompanied active denervation.   Impression: Right median neuropathy at or distal to the wrist, consistent with the clinical diagnosis of carpal tunnel syndrome. Overall, these findings are moderate in degree electrically.   ___________________________ Nita Sickleonika Copeland Lapier, DO    Nerve Conduction Studies Anti Sensory Summary Table   Stim Site NR Peak (ms) Norm Peak (ms) P-T Amp (V) Norm P-T Amp  Right Median Anti Sensory (2nd Digit)  Wrist    4.7 <3.8 11.6 >10  Right Ulnar Anti Sensory (5th Digit)  Wrist    2.8 <3.2 17.9 >5   Motor Summary Table   Stim Site NR Onset (ms) Norm Onset (ms) O-P Amp (mV) Norm O-P Amp Site1 Site2 Delta-0 (ms) Dist (cm) Vel (m/s) Norm Vel (m/s)  Right Median Motor (Abd Poll Brev)  Wrist    5.2 <4.0 7.6 >5 Elbow Wrist 4.7 24.0 51 >50  Elbow    9.9  6.9         Right Ulnar Motor (Abd Dig Minimi)  Wrist    2.7 <3.1 7.9 >7 B Elbow Wrist 3.6 20.0 56 >50  B Elbow    6.3  7.2  A Elbow B Elbow 1.9 10.0 53 >50  A Elbow    8.2  6.4          EMG   Side Muscle Ins Act Fibs Psw Fasc  Number Recrt Dur Dur. Amp Amp. Poly Poly. Comment  Right 1stDorInt Nml Nml Nml Nml Nml Nml Nml Nml Nml Nml Nml Nml N/A  Right Abd Poll Brev Nml Nml Nml Nml 1- Rapid Few 1+ Few 1+ Nml Nml N/A  Right FlexPolLong Nml Nml Nml Nml Nml Nml Nml Nml Nml Nml Nml Nml N/A  Right Ext Indicis Nml Nml Nml Nml Nml Nml Nml Nml Nml Nml Nml Nml N/A  Right PronatorTeres Nml Nml Nml Nml Nml Nml Nml Nml Nml Nml Nml Nml N/A  Right Biceps Nml Nml Nml Nml Nml Nml Nml Nml Nml Nml Nml Nml N/A  Right Triceps Nml Nml Nml Nml Nml Nml Nml Nml Nml Nml Nml Nml N/A  Right Deltoid Nml Nml Nml Nml Nml Nml Nml Nml Nml Nml Nml Nml N/A      Waveforms:

## 2016-03-15 ENCOUNTER — Ambulatory Visit: Payer: Self-pay | Admitting: Internal Medicine

## 2016-04-04 ENCOUNTER — Encounter: Payer: Self-pay | Admitting: Internal Medicine

## 2016-04-04 ENCOUNTER — Ambulatory Visit (INDEPENDENT_AMBULATORY_CARE_PROVIDER_SITE_OTHER): Payer: Medicare Other | Admitting: Internal Medicine

## 2016-04-04 VITALS — BP 144/78 | HR 80 | Temp 98.0°F | Resp 18 | Ht 65.0 in | Wt 224.0 lb

## 2016-04-04 DIAGNOSIS — F411 Generalized anxiety disorder: Secondary | ICD-10-CM | POA: Diagnosis not present

## 2016-04-04 DIAGNOSIS — I10 Essential (primary) hypertension: Secondary | ICD-10-CM

## 2016-04-04 DIAGNOSIS — Z79899 Other long term (current) drug therapy: Secondary | ICD-10-CM | POA: Diagnosis not present

## 2016-04-04 DIAGNOSIS — R7303 Prediabetes: Secondary | ICD-10-CM

## 2016-04-04 DIAGNOSIS — E559 Vitamin D deficiency, unspecified: Secondary | ICD-10-CM

## 2016-04-04 DIAGNOSIS — E782 Mixed hyperlipidemia: Secondary | ICD-10-CM

## 2016-04-04 LAB — LIPID PANEL
CHOL/HDL RATIO: 1.8 ratio (ref <5.0)
CHOLESTEROL: 204 mg/dL — AB (ref <200)
HDL: 114 mg/dL (ref >50)
LDL Cholesterol: 70 mg/dL (ref <100)
Triglycerides: 100 mg/dL (ref <150)
VLDL: 20 mg/dL (ref <30)

## 2016-04-04 LAB — HEPATIC FUNCTION PANEL
ALK PHOS: 63 U/L (ref 33–130)
ALT: 39 U/L — ABNORMAL HIGH (ref 6–29)
AST: 48 U/L — AB (ref 10–35)
Albumin: 4.4 g/dL (ref 3.6–5.1)
BILIRUBIN DIRECT: 0.2 mg/dL (ref <=0.2)
BILIRUBIN INDIRECT: 0.8 mg/dL (ref 0.2–1.2)
BILIRUBIN TOTAL: 1 mg/dL (ref 0.2–1.2)
Total Protein: 7.7 g/dL (ref 6.1–8.1)

## 2016-04-04 LAB — CBC WITH DIFFERENTIAL/PLATELET
Basophils Absolute: 0 cells/uL (ref 0–200)
Basophils Relative: 0 %
EOS PCT: 0 %
Eosinophils Absolute: 0 cells/uL — ABNORMAL LOW (ref 15–500)
HCT: 42.8 % (ref 35.0–45.0)
Hemoglobin: 14.3 g/dL (ref 11.7–15.5)
LYMPHS ABS: 1170 {cells}/uL (ref 850–3900)
Lymphocytes Relative: 15 %
MCH: 31.1 pg (ref 27.0–33.0)
MCHC: 33.4 g/dL (ref 32.0–36.0)
MCV: 93 fL (ref 80.0–100.0)
MPV: 9.5 fL (ref 7.5–12.5)
Monocytes Absolute: 624 cells/uL (ref 200–950)
Monocytes Relative: 8 %
NEUTROS ABS: 6006 {cells}/uL (ref 1500–7800)
Neutrophils Relative %: 77 %
PLATELETS: 257 10*3/uL (ref 140–400)
RBC: 4.6 MIL/uL (ref 3.80–5.10)
RDW: 13.5 % (ref 11.0–15.0)
WBC: 7.8 10*3/uL (ref 3.8–10.8)

## 2016-04-04 LAB — BASIC METABOLIC PANEL WITH GFR
BUN: 13 mg/dL (ref 7–25)
CHLORIDE: 93 mmol/L — AB (ref 98–110)
CO2: 30 mmol/L (ref 20–31)
Calcium: 10.6 mg/dL — ABNORMAL HIGH (ref 8.6–10.4)
Creat: 0.99 mg/dL — ABNORMAL HIGH (ref 0.60–0.93)
GFR, EST NON AFRICAN AMERICAN: 57 mL/min — AB (ref >=60)
GFR, Est African American: 66 mL/min (ref >=60)
Glucose, Bld: 128 mg/dL — ABNORMAL HIGH (ref 65–99)
Potassium: 4.7 mmol/L (ref 3.5–5.3)
SODIUM: 134 mmol/L — AB (ref 135–146)

## 2016-04-04 LAB — TSH: TSH: 2.32 mIU/L

## 2016-04-04 MED ORDER — DICYCLOMINE HCL 20 MG PO TABS: 20.0000 mg | ORAL_TABLET | Freq: Three times a day (TID) | ORAL | 0 refills | Status: DC

## 2016-04-04 NOTE — Progress Notes (Signed)
Assessment and Plan:  Hypertension: -elevated today  -Continue medication,  -monitor blood pressure at home.  -Continue DASH diet.   -Reminder to go to the ER if any CP, SOB, nausea, dizziness, severe HA, changes vision/speech, left arm numbness and tingling, and jaw pain.  Cholesterol: -Continue diet and exercise.  -Check cholesterol.   Pre-diabetes: -Continue diet and exercise.  -Check A1C  Vitamin D Def: -check level -continue medications.   Generalized Anxiety -cont xanax as needed  Continue diet and meds as discussed. Further disposition pending results of labs.  HPI 71 y.o. female  presents for 3 month follow up with hypertension, hyperlipidemia, prediabetes and vitamin D.   Her blood pressure has been controlled at home, today their BP is BP: (!) 144/78.   She does not workout. She denies chest pain, shortness of breath, dizziness.   She is on cholesterol medication and denies myalgias. Her cholesterol is at goal. The cholesterol last visit was:   Lab Results  Component Value Date   CHOL 199 11/24/2015   HDL 119 11/24/2015   LDLCALC 62 11/24/2015   TRIG 92 11/24/2015   CHOLHDL 1.7 11/24/2015     She has been working on diet and exercise for prediabetes, and denies foot ulcerations, hyperglycemia, hypoglycemia , increased appetite, nausea, paresthesia of the feet, polydipsia, polyuria, visual disturbances, vomiting and weight loss. Last A1C in the office was:  Lab Results  Component Value Date   HGBA1C 5.3 11/24/2015    Patient is on Vitamin D supplement.  Lab Results  Component Value Date   VD25OH 4168 11/24/2015     She reports that she is upset as a close friend of hers has been chronically ill and is currently in a coma.  She reports that this makes her very upset.  She is also arguing with her husband about their need to move and their need to get her final affairs in order.     Current Medications:  Current Outpatient Prescriptions on File Prior to  Visit  Medication Sig Dispense Refill  . ALPRAZolam (XANAX) 0.5 MG tablet Take 1/2 to 1 tablet up to 3 x d ay if needed for anxiety or sleep 90 tablet 5  . atorvastatin (LIPITOR) 80 MG tablet Take 1/2 to 1 tablet daily for Cholesterol or as directed 90 tablet 1  . bisoprolol-hydrochlorothiazide (ZIAC) 5-6.25 MG tablet TAKE 1 TABLET BY MOUTH DAILY  FOR BLOOD PRESSURE. 90 tablet 1  . ezetimibe (ZETIA) 10 MG tablet TAKE 1 TABLET (10 MG TOTAL) BY MOUTH DAILY FOR CHOLESTEROL 90 tablet 3  . losartan (COZAAR) 100 MG tablet TAKE 1 TABLET (100 MG TOTAL) BY MOUTH DAILY. 90 tablet 1  . Magnesium Oxide (MAG-CAPS PO) Take by mouth. 400 MG DAILY    . metroNIDAZOLE (METROCREAM) 0.75 % cream     . nystatin (NYSTATIN) powder Apply daily after a shower as needed 60 g 2   No current facility-administered medications on file prior to visit.     Medical History:  Past Medical History:  Diagnosis Date  . Hepatitis C   . HTN (hypertension)   . Hyperlipidemia   . Obesity   . Pre-diabetes   . Scoliosis   . Tobacco use    stopped 6 years ago.  . Vitamin D deficiency     Allergies: No Known Allergies   Review of Systems:  Review of Systems  Constitutional: Negative for chills, fever and malaise/fatigue.  HENT: Negative for congestion, ear pain and sore throat.  Eyes: Negative.   Respiratory: Negative for cough, shortness of breath and wheezing.   Cardiovascular: Negative for chest pain, palpitations and leg swelling.  Gastrointestinal: Negative for abdominal pain, blood in stool, constipation, diarrhea, heartburn and melena.  Genitourinary: Negative.   Skin: Negative.   Neurological: Negative for dizziness, sensory change, loss of consciousness and headaches.  Psychiatric/Behavioral: Negative for depression. The patient is not nervous/anxious and does not have insomnia.     Family history- Review and unchanged  Social history- Review and unchanged  Physical Exam: BP (!) 144/78   Pulse 80    Temp 98 F (36.7 C) (Temporal)   Resp 18   Ht 5\' 5"  (1.651 m)   Wt 224 lb (101.6 kg)   BMI 37.28 kg/m  Wt Readings from Last 3 Encounters:  04/04/16 224 lb (101.6 kg)  02/21/16 224 lb 5 oz (101.7 kg)  11/24/15 220 lb 3.2 oz (99.9 kg)    General Appearance: Well nourished well developed, in no apparent distress. Eyes: PERRLA, EOMs, conjunctiva no swelling or erythema ENT/Mouth: Ear canals normal without obstruction, swelling, erythma, discharge.  TMs normal bilaterally.  Oropharynx moist, clear, without exudate, or postoropharyngeal swelling. Neck: Supple, thyroid normal,no cervical adenopathy  Respiratory: Respiratory effort normal, Breath sounds clear A&P without rhonchi, wheeze, or rale.  No retractions, no accessory usage. Cardio: RRR with no MRGs. Brisk peripheral pulses without edema.  Abdomen: Soft, + BS,  Non tender, no guarding, rebound, hernias, masses. Musculoskeletal: Full ROM, 5/5 strength, Normal gait Skin: Warm, dry without rashes, lesions, ecchymosis.  Neuro: Awake and oriented X 3, Cranial nerves intact. Normal muscle tone, no cerebellar symptoms. Psych: Normal affect, Insight and Judgment appropriate.    Terri Piedra, PA-C 10:41 AM [facility]

## 2016-04-05 LAB — HEMOGLOBIN A1C
Hgb A1c MFr Bld: 5.3 % (ref <5.7)
MEAN PLASMA GLUCOSE: 105 mg/dL

## 2016-04-05 NOTE — Progress Notes (Signed)
Pt aware of lab results & voiced understanding of those results. Pt transferred to front office for  Lab only recheck-CMET visit.

## 2016-04-28 ENCOUNTER — Other Ambulatory Visit: Payer: Self-pay | Admitting: *Deleted

## 2016-04-28 MED ORDER — BISOPROLOL-HYDROCHLOROTHIAZIDE 5-6.25 MG PO TABS: 1.0000 | ORAL_TABLET | Freq: Every day | ORAL | 1 refills | Status: DC

## 2016-05-01 ENCOUNTER — Other Ambulatory Visit: Payer: Self-pay | Admitting: Internal Medicine

## 2016-05-03 ENCOUNTER — Other Ambulatory Visit: Payer: Self-pay

## 2016-05-03 ENCOUNTER — Other Ambulatory Visit: Payer: Medicare Other

## 2016-05-03 DIAGNOSIS — R945 Abnormal results of liver function studies: Principal | ICD-10-CM

## 2016-05-03 DIAGNOSIS — R7989 Other specified abnormal findings of blood chemistry: Secondary | ICD-10-CM

## 2016-05-04 LAB — COMPLETE METABOLIC PANEL WITH GFR
ALBUMIN: 4.3 g/dL (ref 3.6–5.1)
ALK PHOS: 56 U/L (ref 33–130)
ALT: 30 U/L — ABNORMAL HIGH (ref 6–29)
AST: 34 U/L (ref 10–35)
BUN: 11 mg/dL (ref 7–25)
CO2: 24 mmol/L (ref 20–31)
Calcium: 9.8 mg/dL (ref 8.6–10.4)
Chloride: 95 mmol/L — ABNORMAL LOW (ref 98–110)
Creat: 0.77 mg/dL (ref 0.60–0.93)
GFR, EST NON AFRICAN AMERICAN: 77 mL/min (ref >=60)
GFR, Est African American: 89 mL/min (ref >=60)
GLUCOSE: 109 mg/dL — AB (ref 65–99)
POTASSIUM: 4.2 mmol/L (ref 3.5–5.3)
Sodium: 135 mmol/L (ref 135–146)
TOTAL PROTEIN: 6.9 g/dL (ref 6.1–8.1)
Total Bilirubin: 0.8 mg/dL (ref 0.2–1.2)

## 2016-05-18 ENCOUNTER — Other Ambulatory Visit: Payer: Self-pay | Admitting: Internal Medicine

## 2016-05-18 DIAGNOSIS — E785 Hyperlipidemia, unspecified: Secondary | ICD-10-CM

## 2016-06-28 ENCOUNTER — Ambulatory Visit: Payer: Self-pay | Admitting: Internal Medicine

## 2016-07-03 ENCOUNTER — Ambulatory Visit: Payer: Self-pay | Admitting: Internal Medicine

## 2016-07-06 ENCOUNTER — Ambulatory Visit (INDEPENDENT_AMBULATORY_CARE_PROVIDER_SITE_OTHER): Payer: Medicare Other | Admitting: Internal Medicine

## 2016-07-06 ENCOUNTER — Encounter: Payer: Self-pay | Admitting: Internal Medicine

## 2016-07-06 VITALS — BP 138/78 | HR 68 | Temp 97.2°F | Resp 16 | Ht 65.75 in | Wt 218.8 lb

## 2016-07-06 DIAGNOSIS — E559 Vitamin D deficiency, unspecified: Secondary | ICD-10-CM | POA: Diagnosis not present

## 2016-07-06 DIAGNOSIS — I10 Essential (primary) hypertension: Secondary | ICD-10-CM | POA: Diagnosis not present

## 2016-07-06 DIAGNOSIS — E782 Mixed hyperlipidemia: Secondary | ICD-10-CM

## 2016-07-06 DIAGNOSIS — Z79899 Other long term (current) drug therapy: Secondary | ICD-10-CM | POA: Diagnosis not present

## 2016-07-06 DIAGNOSIS — R7303 Prediabetes: Secondary | ICD-10-CM

## 2016-07-06 LAB — CBC WITH DIFFERENTIAL/PLATELET
BASOS PCT: 0 %
Basophils Absolute: 0 cells/uL (ref 0–200)
Eosinophils Absolute: 79 cells/uL (ref 15–500)
Eosinophils Relative: 1 %
HEMATOCRIT: 43.3 % (ref 35.0–45.0)
Hemoglobin: 14.4 g/dL (ref 11.7–15.5)
LYMPHS PCT: 22 %
Lymphs Abs: 1738 cells/uL (ref 850–3900)
MCH: 30.6 pg (ref 27.0–33.0)
MCHC: 33.3 g/dL (ref 32.0–36.0)
MCV: 91.9 fL (ref 80.0–100.0)
MONO ABS: 632 {cells}/uL (ref 200–950)
MONOS PCT: 8 %
MPV: 9.8 fL (ref 7.5–12.5)
NEUTROS PCT: 69 %
Neutro Abs: 5451 cells/uL (ref 1500–7800)
PLATELETS: 276 10*3/uL (ref 140–400)
RBC: 4.71 MIL/uL (ref 3.80–5.10)
RDW: 13 % (ref 11.0–15.0)
WBC: 7.9 10*3/uL (ref 3.8–10.8)

## 2016-07-06 NOTE — Patient Instructions (Signed)

## 2016-07-06 NOTE — Progress Notes (Signed)
This very nice 71 y.o. MWF presents for 3 month follow up with Hypertension, Hyperlipidemia, Pre-Diabetes and Vitamin D Deficiency.      She also has Chronic LBP from severe thoracolumbar scoliosis s/pDDD fusion surgery X 3. Other problems are hx/o Hepatitis C and she was treated to remission in 1987.      Patient is treated for HTN (1996) and she has CKD 3 (GFR 57 ml/min) & BP has been controlled at home. Today's BP was elevated at 162/80 and rechecked at 138/78. Patient has had no complaints of any cardiac type chest pain, palpitations, dyspnea/orthopnea/PND, dizziness, claudication, or dependent edema.     Hyperlipidemia is controlled with diet & meds. Patient denies myalgias or other med SE's. Last Lipids were at goal with high HDL of 114  and low LDL of 70: Lab Results  Component Value Date   CHOL 204 (H) 04/04/2016   HDL 114 04/04/2016   LDLCALC 70 04/04/2016   TRIG 100 04/04/2016   CHOLHDL 1.8 04/04/2016      Also, the patient has history of Morbid Obesity (BMI 35+) and PreDiabetes (A1c 5.7% & 5.8% in 2016).  She denies symptoms of reactive hypoglycemia, diabetic polys, paresthesias or visual blurring.  Last A1c was at goal: Lab Results  Component Value Date   HGBA1C 5.3 04/04/2016      Further, the patient also has history of Vitamin D Deficiency ("12" in 2008) and supplements vitamin D without any suspected side-effects. Last vitamin D was at goal:  Lab Results  Component Value Date   VD25OH 68 11/24/2015   Current Outpatient Prescriptions on File Prior to Visit  Medication Sig  . ALPRAZolam  0.5 MG tablet Take 1/2 to 1 tab up to 3 x day if needed   . atorvastatin  80 MG tablet TAKE 1/2-1  TAB DAILY  . bisoprolol-hctz 5-6.25  Take 1 tabl daily  . dicyclomine  20 MG tablet Take 1 tab 4 x daily.  Marland Kitchen. ezetimibe  10 MG tablet TAKE 1 TAB DAILY  . losartan  100 MG tablet TAKE ONE TAB DAILY  . METROCREAM 0.75 % cream   . nystatin  powder Apply daily after a shower as needed     No Known Allergies   PMHx:   Past Medical History:  Diagnosis Date  . Hepatitis C   . HTN (hypertension)   . Hyperlipidemia   . Obesity   . Pre-diabetes   . Scoliosis   . Tobacco use    stopped 6 years ago.  . Vitamin D deficiency    Immunization History  Administered Date(s) Administered  . DTaP 03/06/2006  . Influenza Split 11/20/2013, 12/24/2014  . Influenza Whole 12/19/2012  . Pneumococcal Conjugate-13 05/13/2014  . Pneumococcal Polysaccharide-23 03/06/2008  . Typhoid Live 03/06/2004  . Zoster 06/11/2007   Past Surgical History:  Procedure Laterality Date  . BACK SURGERY  1986   x 3  . BREAST RECONSTRUCTION     x 2  . CATARACT EXTRACTION     Bilateral   FHx:    Reviewed / unchanged  SHx:    Reviewed / unchanged  Systems Review:  Constitutional: Denies fever, chills, wt changes, headaches, insomnia, fatigue, night sweats, change in appetite. Eyes: Denies redness, blurred vision, diplopia, discharge, itchy, watery eyes.  ENT: Denies discharge, congestion, post nasal drip, epistaxis, sore throat, earache, hearing loss, dental pain, tinnitus, vertigo, sinus pain, snoring.  CV: Denies chest pain, palpitations, irregular heartbeat, syncope, dyspnea, diaphoresis,  orthopnea, PND, claudication or edema. Respiratory: denies cough, dyspnea, DOE, pleurisy, hoarseness, laryngitis, wheezing.  Gastrointestinal: Denies dysphagia, odynophagia, heartburn, reflux, water brash, abdominal pain or cramps, nausea, vomiting, bloating, diarrhea, constipation, hematemesis, melena, hematochezia  or hemorrhoids. Genitourinary: Denies dysuria, frequency, urgency, nocturia, hesitancy, discharge, hematuria or flank pain. Musculoskeletal: Denies arthralgias, myalgias, stiffness, jt. swelling, pain, limping or strain/sprain.  Skin: Denies pruritus, rash, hives, warts, acne, eczema or change in skin lesion(s). Neuro: No weakness, tremor, incoordination, spasms, paresthesia or  pain. Psychiatric: Denies confusion, memory loss or sensory loss. Endo: Denies change in weight, skin or hair change.  Heme/Lymph: No excessive bleeding, bruising or enlarged lymph nodes.  Physical Exam  BP (!) 162/80   Pulse 68   Temp 97.2 F (36.2 C)   Resp 16   Ht 5' 5.75" (1.67 m)   Wt 218 lb 12.8 oz (99.2 kg)   BMI 35.58 kg/m   Appears well nourished, well groomed  and in no distress.  Eyes: PERRLA, EOMs, conjunctiva no swelling or erythema. Sinuses: No frontal/maxillary tenderness ENT/Mouth: EAC's clear, TM's nl w/o erythema, bulging. Nares clear w/o erythema, swelling, exudates. Oropharynx clear without erythema or exudates. Oral hygiene is good. Tongue normal, non obstructing. Hearing intact.  Neck: Supple. Thyroid nl. Car 2+/2+ without bruits, nodes or JVD. Chest: Respirations nl with BS clear & equal w/o rales, rhonchi, wheezing or stridor.  Cor: Heart sounds normal w/ regular rate and rhythm without sig. murmurs, gallops, clicks or rubs. Peripheral pulses normal and equal  without edema.  Abdomen: Soft & bowel sounds normal. Non-tender w/o guarding, rebound, hernias, masses or organomegaly.  Lymphatics: Unremarkable.  Musculoskeletal: Full ROM all peripheral extremities, joint stability, 5/5 strength and normal gait.  Skin: Warm, dry without exposed rashes, lesions or ecchymosis apparent.  Neuro: Cranial nerves intact, reflexes equal bilaterally. Sensory-motor testing grossly intact. Tendon reflexes grossly intact.  Pysch: Alert & oriented x 3.  Insight and judgement nl & appropriate. No ideations.  Assessment and Plan:  1. Essential hypertension  - Continue medication, monitor blood pressure at home.  - Continue DASH diet. Reminder to go to the ER if any CP,  SOB, nausea, dizziness, severe HA, changes vision/speech,  left arm numbness and tingling and jaw pain.  - CBC with Differential/Platelet - BASIC METABOLIC PANEL WITH GFR - Magnesium - TSH  2.  Hyperlipidemia, mixed  - Continue diet/meds, exercise,& lifestyle modifications.  - Continue monitor periodic cholesterol/liver & renal functions   - Hepatic function panel - Lipid panel - TSH  3. Prediabetes  - Continue diet, exercise, lifestyle modifications.  - Monitor appropriate labs.  - Hemoglobin A1c - Insulin, random  4. Vitamin D deficiency  - Continue supplementation.  - VITAMIN D 25 Hydroxy   5. Medication management   - CBC with Differential/Platelet - BASIC METABOLIC PANEL WITH GFR - Hepatic function panel - Magnesium - Lipid panel - TSH - Hemoglobin A1c - Insulin, random - VITAMIN D 25 Hydroxy        Discussed  regular exercise, BP monitoring, weight control to achieve/maintain BMI less than 25 and discussed med and SE's. Recommended labs to assess and monitor clinical status with further disposition pending results of labs. Over 30 minutes of exam, counseling, chart review was performed.

## 2016-07-07 LAB — HEPATIC FUNCTION PANEL
ALT: 20 U/L (ref 6–29)
AST: 26 U/L (ref 10–35)
Albumin: 4.6 g/dL (ref 3.6–5.1)
Alkaline Phosphatase: 59 U/L (ref 33–130)
BILIRUBIN DIRECT: 0.2 mg/dL (ref <=0.2)
BILIRUBIN INDIRECT: 0.8 mg/dL (ref 0.2–1.2)
Total Bilirubin: 1 mg/dL (ref 0.2–1.2)
Total Protein: 7.6 g/dL (ref 6.1–8.1)

## 2016-07-07 LAB — BASIC METABOLIC PANEL WITH GFR
BUN: 10 mg/dL (ref 7–25)
CALCIUM: 10 mg/dL (ref 8.6–10.4)
CO2: 30 mmol/L (ref 20–31)
Chloride: 95 mmol/L — ABNORMAL LOW (ref 98–110)
Creat: 0.74 mg/dL (ref 0.60–0.93)
GFR, EST NON AFRICAN AMERICAN: 81 mL/min (ref >=60)
Glucose, Bld: 98 mg/dL (ref 65–99)
Potassium: 4.6 mmol/L (ref 3.5–5.3)
Sodium: 135 mmol/L (ref 135–146)

## 2016-07-07 LAB — TSH: TSH: 2.03 mIU/L

## 2016-07-07 LAB — LIPID PANEL
CHOL/HDL RATIO: 2.1 ratio (ref <5.0)
CHOLESTEROL: 227 mg/dL — AB (ref <200)
HDL: 106 mg/dL (ref >50)
LDL Cholesterol: 98 mg/dL (ref <100)
TRIGLYCERIDES: 114 mg/dL (ref <150)
VLDL: 23 mg/dL (ref <30)

## 2016-07-07 LAB — MAGNESIUM: MAGNESIUM: 1.8 mg/dL (ref 1.5–2.5)

## 2016-07-07 LAB — HEMOGLOBIN A1C
Hgb A1c MFr Bld: 5.3 % (ref <5.7)
MEAN PLASMA GLUCOSE: 105 mg/dL

## 2016-07-07 LAB — INSULIN, RANDOM: Insulin: 8.8 u[IU]/mL (ref 2.0–19.6)

## 2016-07-07 LAB — VITAMIN D 25 HYDROXY (VIT D DEFICIENCY, FRACTURES): Vit D, 25-Hydroxy: 83 ng/mL (ref 30–100)

## 2016-07-27 ENCOUNTER — Encounter: Payer: Self-pay | Admitting: Internal Medicine

## 2016-07-28 ENCOUNTER — Other Ambulatory Visit: Payer: Self-pay | Admitting: Internal Medicine

## 2016-07-28 DIAGNOSIS — G4700 Insomnia, unspecified: Secondary | ICD-10-CM

## 2016-07-28 NOTE — Telephone Encounter (Signed)
Please call Alpraz  

## 2016-09-18 ENCOUNTER — Encounter: Payer: Self-pay | Admitting: Physician Assistant

## 2016-09-18 ENCOUNTER — Ambulatory Visit (INDEPENDENT_AMBULATORY_CARE_PROVIDER_SITE_OTHER): Payer: Medicare Other | Admitting: Physician Assistant

## 2016-09-18 VITALS — BP 126/88 | HR 81 | Temp 97.3°F | Resp 14 | Ht 65.75 in | Wt 221.0 lb

## 2016-09-18 DIAGNOSIS — R490 Dysphonia: Secondary | ICD-10-CM

## 2016-09-18 MED ORDER — DEXAMETHASONE SODIUM PHOSPHATE 100 MG/10ML IJ SOLN: 10.0000 mg | Freq: Once | INTRAMUSCULAR | Status: DC

## 2016-09-18 MED ORDER — OMEPRAZOLE 40 MG PO CPDR: 40.0000 mg | DELAYED_RELEASE_CAPSULE | Freq: Every day | ORAL | 0 refills | Status: DC

## 2016-09-18 NOTE — Progress Notes (Signed)
Subjective:    Patient ID: Deanna CowmanSuzanne B Gomez, female    DOB: 1945/08/12, 71 y.o.   MRN: [id number]  HPI 71 y.o. former smoker, quit 2006 (30 pack year) Deanna Gomez presents with hoarseness in her throat x 3 weeks.  Deanna Gomez states for 3 weeks, no throat pain unless Deanna Gomez talks for a long time. Has had some sinus issues, sinus congestion, occ headache. Deanna Gomez has not tried anything. No fever, chills. Deanna Gomez does have occ GERD, occ pill that gets caught.    Blood pressure 126/88, pulse 81, temperature (!) 97.3 F (36.3 C), resp. rate 14, height 5' 5.75" (1.67 m), weight 221 lb (100.2 kg), SpO2 95 %.  Medications Current Outpatient Prescriptions on File Prior to Visit  Medication Sig  . ALPRAZolam (XANAX) 0.5 MG tablet TAKE ONE-HALF TO ONE TABLET BY MOUTH UP TO THREE TIMES A DAY AS NEEDED FOR ANXIETY OR SLEEP  . atorvastatin (LIPITOR) 80 MG tablet TAKE 1/2 TO 1 (ONE-HALF TO ONE) TABLET BY MOUTH DAILY FOR CHOLESTEROL OR AS DIRECTED  . bisoprolol-hydrochlorothiazide (ZIAC) 5-6.25 MG tablet TAKE ONE TABLET BY MOUTH DAILY FOR BLOOD PRESSURE  . ezetimibe (ZETIA) 10 MG tablet TAKE 1 TABLET (10 MG TOTAL) BY MOUTH DAILY FOR CHOLESTEROL  . losartan (COZAAR) 100 MG tablet TAKE ONE TABLET BY MOUTH DAILY  . metroNIDAZOLE (METROCREAM) 0.75 % cream   . nystatin (NYSTATIN) powder Apply daily after a shower as needed   No current facility-administered medications on file prior to visit.     Problem list Deanna Gomez has Essential hypertension; Hyperlipidemia; Prediabetes; Vitamin D deficiency; Hepatitis C virus infection; Medication management; Morbid obesity (BMI 35.46); Gastric ulcer; and Encounter for Medicare annual wellness exam on her problem list.   Review of Systems  Constitutional: Negative for chills, diaphoresis, fatigue and fever.  HENT: Positive for congestion, postnasal drip, rhinorrhea, sinus pressure, sore throat, trouble swallowing and voice change. Negative for drooling, ear discharge, ear pain, facial swelling,  hearing loss, sinus pain, sneezing and tinnitus.   Respiratory: Negative.  Negative for cough and shortness of breath.   Cardiovascular: Negative.  Negative for chest pain and leg swelling.  Gastrointestinal:       + GERD  Musculoskeletal: Positive for neck pain.  Neurological: Negative.  Negative for headaches.       Objective:   Physical Exam  Constitutional: Deanna Gomez is oriented to person, place, and time. Deanna Gomez appears well-developed and well-nourished.  HENT:  Head: Normocephalic and atraumatic.  Right Ear: External ear normal.  Left Ear: External ear normal.  Mouth/Throat: Oropharynx is clear and moist.  Eyes: Pupils are equal, round, and reactive to light. Conjunctivae and EOM are normal.  Neck: Normal range of motion. Neck supple. No thyromegaly present.  Cardiovascular: Normal rate, regular rhythm and normal heart sounds.  Exam reveals no gallop and no friction rub.   No murmur heard. Pulmonary/Chest: Effort normal and breath sounds normal. No respiratory distress. Deanna Gomez has no wheezes.  Abdominal: Soft. Bowel sounds are normal. Deanna Gomez exhibits no distension and no mass. There is no tenderness. There is no rebound and no guarding.  Musculoskeletal: Normal range of motion.  Lymphadenopathy:    Deanna Gomez has no cervical adenopathy.  Neurological: Deanna Gomez is alert and oriented to person, place, and time. Deanna Gomez displays normal reflexes. No cranial nerve deficit. Coordination normal.  Skin: Skin is warm and dry.  Psychiatric: Deanna Gomez has a normal mood and affect.       Assessment & Plan:    Hoarseness of voice Hoarseness-  get on PPI, may need to add H2/refer GI. Get on allergy pill, voice rest If not better will refer to ENT If you do not get better or if you start to have trouble swallowing, sore throat, fever, chills, go to the ER or call the office.  -     dexamethasone (DECADRON) injection 10 mg; Inject 1 mL (10 mg total) into the muscle once. -     omeprazole (PRILOSEC) 40 MG capsule; Take 1  capsule (40 mg total) by mouth daily.

## 2016-09-18 NOTE — Patient Instructions (Addendum)
Common causes of cough OR hoarseness OR sore throat:   Allergies, Viral Infections, Acid Reflux and Bacterial Infections.  1) Allergies and viral infections cause a cough OR sore throat by post nasal drip and are often worse at night, can also have sneezing, lower grade fevers, clear/yellow mucus. This is best treated with allergy medications or nasal sprays.  Please get on allegra for 1-2 weeks The strongest is allegra or fexafinadine  Cheapest at [employer], [employer], [employer]  2) Bacterial infections are more severe than allergies or viral infections with fever, teeth pain, fatigue. This can be treated with prednisone and the same over the counter medication and after 7 days can be treated with an antibiotic.   3) Silent reflux/GERD can cause a cough OR sore throat OR hoarseness WITHOUT heart burn because the esophagus that goes to the stomach and trachea that goes to the lungs are very close and when you lay down the acid can irritate your throat and lungs. This can cause hoarseness, cough, and wheezing. Please stop any alcohol or anti-inflammatories like aleve/advil/ibuprofen and start an over the counter Prilosec or omeprazole 1-2 times daily 30mins before food for 2 weeks, then switch to over the counter zantac/ratinidine or pepcid/famotadine once at night for 2 weeks.   4) sometimes irritation causes more irritation. Try voice rest, use sugar free cough drops to prevent coughing, and try to stop clearing your throat.   If you ever have a cough that does not go away after trying these things please make a follow up visit for further evaluation or we can refer you to a specialist. Or if you ever have shortness of breath or chest pain go to the ER.     TAKE PRILOSEC 1 TIME IN THE MORNING FOR 2 WEEKS TAKE ALLERGY PILL DO SUGAR FREE CANDY TO SUCK ON  SALT WATER GARGLES DO VOICE REST WHEN POSSIBLE HOT TEA AND HONEY   IF ITS NOT BETTER IN 2-4 WEEKS LET US KNOW AND WILL SEND TO EAR NOSE AND THROAT  DOCTOR

## 2016-09-28 ENCOUNTER — Ambulatory Visit: Payer: Self-pay | Admitting: Physician Assistant

## 2016-10-23 NOTE — Progress Notes (Signed)
Patient ID: Deanna Gomez, female   DOB: April 26, 1945, 71 y.o.   MRN: [id number]   MEDICARE ANNUAL WELLNESS VISIT AND 3 MONTH  Assessment:    Essential hypertension - continue medications, DASH diet, exercise and monitor at home. Call if greater than 130/80.  -     CBC with Differential/Platelet -     BASIC METABOLIC PANEL WITH GFR -     Hepatic function panel -     TSH  Hepatitis C virus infection without hepatic coma, unspecified chronicity -     Hepatic function panel  Hyperlipidemia -continue medications, check lipids, decrease fatty foods, increase activity.  -     Lipid panel  Prediabetes Discussed general issues about diabetes pathophysiology and management., Educational material distributed., Suggested low cholesterol diet., Encouraged aerobic exercise., Discussed foot care., Reminded to get yearly retinal exam.  Medication management -     Magnesium  Morbid obesity (BMI 35.46) - long discussion about weight loss, diet, and exercise  Vitamin D deficiency Continue supplement  Acute gastric ulcer without hemorrhage or perforation -     pantoprazole (PROTONIX) 40 MG tablet; Take 1 tablet (40 mg total) by mouth daily. - diet discussed, follow up GI after ENT.  Encounter for Medicare annual wellness exam 1 year  Hoarseness of voice -     pantoprazole (PROTONIX) 40 MG tablet; Take 1 tablet (40 mg total) by mouth daily. -     Ambulatory referral to ENT  Need for prophylactic vaccination against Streptococcus pneumoniae (pneumococcus) -     Pneumococcal polysaccharide vaccine 23-valent greater than or equal to 2yo subcutaneous/IM   Future Appointments Date Time Provider Department Center  01/31/2017 10:00 AM Lucky Cowboy, MD [facility] None     Plan:   During the course of the visit the patient was educated and counseled about appropriate screening and preventive services including:    Pneumococcal vaccine   Influenza vaccine  Td  vaccine  Screening electrocardiogram  Bone densitometry screening  Colorectal cancer screening  Diabetes screening  Glaucoma screening  Nutrition counseling   Advanced directives: requested   Subjective:   Deanna Gomez is a 71 y.o. female who presents for Medicare Annual Wellness Visit and follow up.   She has had elevated blood pressure since 1996. Her blood pressure has been controlled at home, & today their BP is BP: 120/82  She has been having hoarseness, has tried Prilosec, voice rest, allergy pill and prednisone and did not help, will refer to ENT.  She does not workout. She denies chest pain, shortness of breath, dizziness.  She is on cholesterol medication and denies myalgias. Her cholesterol is at goal. The cholesterol last visit was:  Lab Results  Component Value Date   CHOL 227 (H) 07/06/2016   HDL 106 07/06/2016   LDLCALC 98 07/06/2016   TRIG 114 07/06/2016   CHOLHDL 2.1 07/06/2016   She has had prediabetes for 4years (2012). She has been working on diet and exercise for prediabetes, and denies foot ulcerations, hyperglycemia, hypoglycemia , increased appetite, nausea, paresthesia of the feet, polydipsia, polyuria, visual disturbances, vomiting and weight loss. Last A1C in the office was:  Lab Results  Component Value Date   HGBA1C 5.3 07/06/2016   Patient is on Vitamin D supplement.   Lab Results  Component Value Date   VD25OH 6 07/06/2016     She has also been taking care of her husband for his medical issues.   BMI is Body mass index is 35.84  kg/m., she is working on diet and exercise. Wt Readings from Last 3 Encounters:  10/25/16 220 lb 6.4 oz (100 kg)  09/18/16 221 lb (100.2 kg)  07/06/16 218 lb 12.8 oz (99.2 kg)   Names of Other Physician/Practitioners you currently use: 1. [facility] here for primary care 2. Jimmey Ralph, eye doctor, last visit dec/2017 3. Allyson Sabal, last visit aug/2018  Patient Care  Team: Lucky Cowboy, MD as PCP - General (Internal Medicine) Sharrell Ku, MD as Consulting Physician (Gastroenterology) Teodora Medici, MD as Consulting Physician (Gynecology) Elmon Else, MD as Consulting Physician (Dermatology) [name], DO (Ophthalmology)  Medication Review: Current Outpatient Prescriptions on File Prior to Visit  Medication Sig  . ALPRAZolam (XANAX) 0.5 MG tablet TAKE ONE-HALF TO ONE TABLET BY MOUTH UP TO THREE TIMES A DAY AS NEEDED FOR ANXIETY OR SLEEP  . atorvastatin (LIPITOR) 80 MG tablet TAKE 1/2 TO 1 (ONE-HALF TO ONE) TABLET BY MOUTH DAILY FOR CHOLESTEROL OR AS DIRECTED  . bisoprolol-hydrochlorothiazide (ZIAC) 5-6.25 MG tablet TAKE ONE TABLET BY MOUTH DAILY FOR BLOOD PRESSURE  . ezetimibe (ZETIA) 10 MG tablet TAKE 1 TABLET (10 MG TOTAL) BY MOUTH DAILY FOR CHOLESTEROL  . losartan (COZAAR) 100 MG tablet TAKE ONE TABLET BY MOUTH DAILY  . metroNIDAZOLE (METROCREAM) 0.75 % cream   . nystatin (NYSTATIN) powder Apply daily after a shower as needed   Current Facility-Administered Medications on File Prior to Visit  Medication  . dexamethasone (DECADRON) injection 10 mg    Current Problems (verified) Patient Active Problem List   Diagnosis Date Noted  . Encounter for Medicare annual wellness exam 08/05/2015  . Gastric ulcer 11/19/2014  . Morbid obesity (BMI 35.46) 10/15/2014  . Medication management 10/13/2013  . Essential hypertension 03/09/2013  . Hyperlipidemia 03/09/2013  . Prediabetes 03/09/2013  . Vitamin D deficiency 03/09/2013  . Hepatitis C virus infection 03/09/2013    Screening Tests Immunization History  Administered Date(s) Administered  . DTaP 03/06/2006  . Influenza Split 11/20/2013, 12/24/2014  . Influenza Whole 12/19/2012  . Pneumococcal Conjugate-13 05/13/2014  . Pneumococcal Polysaccharide-23 03/06/2008, 10/25/2016  . Typhoid Live 03/06/2004  . Zoster 06/11/2007   Preventative care: Last colonoscopy: 10/2014 CXR  06/2016 [facility] 06/2016 Echo 2013 Stress test 2013 PAP 11/2014 DEXA: 2010  History reviewed: allergies, current medications, past family history, past medical history, past social history, past surgical history and problem list  Allergies No Active Allergies  SURGICAL HISTORY She  has a past surgical history that includes Back surgery (1986); Breast reconstruction; and Cataract extraction. FAMILY HISTORY Her family history includes Coronary artery disease in her brother; Hypertension in her mother. SOCIAL HISTORY She  reports that she quit smoking about 12 years ago. Her smoking use included Cigarettes. She has a 30.00 pack-year smoking history. She has never used smokeless tobacco. She reports that she drinks about 7.0 oz of alcohol per week . She reports that she does not use drugs.  MEDICARE WELLNESS OBJECTIVES: Physical activity: Current Exercise Habits: Home exercise routine, Type of exercise: walking, Time (Minutes): 30, Frequency (Times/Week): 3, Weekly Exercise (Minutes/Week): 90 Cardiac risk factors: Cardiac Risk Factors include: advanced age (>31men, >51 women);dyslipidemia;hypertension;obesity (BMI >30kg/m2);family history of premature cardiovascular disease;sedentary lifestyle Depression/mood screen:   Depression screen [facility] 2/9 10/25/2016  Decreased Interest 0  Down, Depressed, Hopeless 0  PHQ - 2 Score 0    ADLs:  In your present state of health, do you have any difficulty performing the following activities: 10/25/2016 07/06/2016  Hearing? N N  Vision? N N  Difficulty concentrating or making decisions? N N  Walking or climbing stairs? N N  Dressing or bathing? N N  Doing errands, shopping? N N  Some recent data might be hidden     Cognitive Testing  Alert? Yes  Normal Appearance?Yes  Oriented to person? Yes  Place? Yes   Time? Yes  Recall of three objects?  Yes  Can perform simple calculations? Yes  Displays appropriate judgment?Yes  Can read the correct time from  a watch face?Yes  EOL planning: Does Patient Have a Medical Advance Directive?: Yes Type of Advance Directive: Healthcare Power of Attorney, Living will Copy of Healthcare Power of Attorney in Chart?: No - copy requested   Objective:     BP 120/82   Pulse 65   Temp (!) 97.4 F (36.3 C)   Resp 14   Ht 5' 5.75" (1.67 m)   Wt 220 lb 6.4 oz (100 kg)   SpO2 97%   BMI 35.84 kg/m   General Appearance: Well nourished, alert, WD/WN, femaleand in no apparent distress. Eyes: PERRLA, EOMs, conjunctiva no swelling or erythema, normal fundi and vessels. Sinuses: No frontal/maxillary tenderness ENT/Mouth: EACs patent / TMs  nl. Nares clear without erythema, swelling, mucoid exudates. Oral hygiene is good. No erythema, swelling, or exudate. Tongue normal, non-obstructing. Tonsils not swollen or erythematous. Hearing normal.  Neck: Supple, thyroid normal. No bruits, nodes or JVD. Respiratory: Respiratory effort normal.  BS equal and clear bilateral without rales, rhonci, wheezing or stridor. Cardio: Heart sounds are normal with regular rate and rhythm and no murmurs, rubs or gallops. Peripheral pulses are normal and equal bilaterally without edema. No aortic or femoral bruits. Chest: symmetric with normal excursions and percussion.  Abdomen: Flat, soft  with nl bowel sounds. Nontender, no guarding, rebound, hernias, masses, or organomegaly.  Lymphatics: Non tender without lymphadenopathy.  Musculoskeletal: Full ROM all peripheral extremities, joint stability, 5/5 strength, and normal gait. Skin: Warm and dry without rashes, lesions, cyanosis, clubbing or  ecchymosis.  Neuro: Cranial nerves intact, reflexes equal bilaterally. Normal muscle tone, no cerebellar symptoms. Sensation intact.  Pysch: Awake and oriented X 3, normal affect, Insight and Judgment appropriate.   Medicare Attestation I have personally reviewed: The patient's medical and social history Their use of alcohol, tobacco or illicit  drugs Their current medications and supplements The patient's functional ability including ADLs,fall risks, home safety risks, cognitive, and hearing and visual impairment Diet and physical activities Evidence for depression or mood disorders  The patient's weight, height, BMI, and visual acuity have been recorded in the chart.  I have made referrals, counseling, and provided education to the patient based on review of the above and I have provided the patient with a written personalized care plan for preventive services.    Quentin Mulling, PA-C   10/25/2016

## 2016-10-25 ENCOUNTER — Ambulatory Visit (INDEPENDENT_AMBULATORY_CARE_PROVIDER_SITE_OTHER): Payer: Medicare Other | Admitting: Physician Assistant

## 2016-10-25 ENCOUNTER — Encounter: Payer: Self-pay | Admitting: Physician Assistant

## 2016-10-25 VITALS — BP 120/82 | HR 65 | Temp 97.4°F | Resp 14 | Ht 65.75 in | Wt 220.4 lb

## 2016-10-25 DIAGNOSIS — R490 Dysphonia: Secondary | ICD-10-CM | POA: Diagnosis not present

## 2016-10-25 DIAGNOSIS — K253 Acute gastric ulcer without hemorrhage or perforation: Secondary | ICD-10-CM

## 2016-10-25 DIAGNOSIS — E782 Mixed hyperlipidemia: Secondary | ICD-10-CM

## 2016-10-25 DIAGNOSIS — R7303 Prediabetes: Secondary | ICD-10-CM | POA: Diagnosis not present

## 2016-10-25 DIAGNOSIS — Z23 Encounter for immunization: Secondary | ICD-10-CM | POA: Diagnosis not present

## 2016-10-25 DIAGNOSIS — Z79899 Other long term (current) drug therapy: Secondary | ICD-10-CM | POA: Diagnosis not present

## 2016-10-25 DIAGNOSIS — Z0000 Encounter for general adult medical examination without abnormal findings: Secondary | ICD-10-CM | POA: Diagnosis not present

## 2016-10-25 DIAGNOSIS — E559 Vitamin D deficiency, unspecified: Secondary | ICD-10-CM

## 2016-10-25 DIAGNOSIS — I10 Essential (primary) hypertension: Secondary | ICD-10-CM | POA: Diagnosis not present

## 2016-10-25 DIAGNOSIS — B1920 Unspecified viral hepatitis C without hepatic coma: Secondary | ICD-10-CM

## 2016-10-25 MED ORDER — PANTOPRAZOLE SODIUM 40 MG PO TBEC: 40.0000 mg | DELAYED_RELEASE_TABLET | Freq: Every day | ORAL | 0 refills | Status: DC

## 2016-10-25 NOTE — Patient Instructions (Addendum)
Take protonix 30 mins before food in the AM Take zantac at night Will refer to ENT Continue voice rest  Vionic shoes  Benefiber is good for constipation/diarrhea/irritable bowel syndrome, it helps with weight loss and can help lower your bad cholesterol. Please do 1 TBSP in the morning in water, coffee, or tea. It can take up to a month before you can see a difference with your bowel movements. It is cheapest from [employer], [employer], [employer].    Common causes of cough OR hoarseness OR sore throat:   Allergies, Viral Infections, Acid Reflux and Bacterial Infections.  1) Allergies and viral infections cause a cough OR sore throat by post nasal drip and are often worse at night, can also have sneezing, lower grade fevers, clear/yellow mucus. This is best treated with allergy medications or nasal sprays.  Please get on allegra for 1-2 weeks The strongest is allegra or fexafinadine  Cheapest at [employer], [employer], [employer]  2) Bacterial infections are more severe than allergies or viral infections with fever, teeth pain, fatigue. This can be treated with prednisone and the same over the counter medication and after 7 days can be treated with an antibiotic.   3) Silent reflux/GERD can cause a cough OR sore throat OR hoarseness WITHOUT heart burn because the esophagus that goes to the stomach and trachea that goes to the lungs are very close and when you lay down the acid can irritate your throat and lungs. This can cause hoarseness, cough, and wheezing. Please stop any alcohol or anti-inflammatories like aleve/advil/ibuprofen and start an over the counter Prilosec or omeprazole 1-2 times daily before food for 2 weeks, then switch to over the counter zantac/ratinidine or pepcid/famotadine once at night for 2 weeks.   4) sometimes irritation causes more irritation. Try voice rest, use sugar free cough drops to prevent coughing, and try to stop clearing your throat.   If you ever have a cough that does  not go away after trying these things please make a follow up visit for further evaluation or we can refer you to a specialist. Or if you ever have shortness of breath or chest pain go to the ER.

## 2016-10-26 ENCOUNTER — Other Ambulatory Visit: Payer: Self-pay | Admitting: Internal Medicine

## 2016-10-26 ENCOUNTER — Other Ambulatory Visit: Payer: Self-pay

## 2016-10-26 ENCOUNTER — Other Ambulatory Visit: Payer: Self-pay | Admitting: Physician Assistant

## 2016-10-26 DIAGNOSIS — R490 Dysphonia: Secondary | ICD-10-CM

## 2016-10-26 DIAGNOSIS — K253 Acute gastric ulcer without hemorrhage or perforation: Secondary | ICD-10-CM

## 2016-10-26 LAB — CBC WITH DIFFERENTIAL/PLATELET
BASOS ABS: 22 {cells}/uL (ref 0–200)
Basophils Relative: 0.3 %
EOS PCT: 0.3 %
Eosinophils Absolute: 22 cells/uL (ref 15–500)
HCT: 42.2 % (ref 35.0–45.0)
HEMOGLOBIN: 14.7 g/dL (ref 11.7–15.5)
Lymphs Abs: 1263 cells/uL (ref 850–3900)
MCH: 31.3 pg (ref 27.0–33.0)
MCHC: 34.8 g/dL (ref 32.0–36.0)
MCV: 89.8 fL (ref 80.0–100.0)
MONOS PCT: 8.7 %
MPV: 10.1 fL (ref 7.5–12.5)
NEUTROS ABS: 5358 {cells}/uL (ref 1500–7800)
NEUTROS PCT: 73.4 %
PLATELETS: 282 10*3/uL (ref 140–400)
RBC: 4.7 10*6/uL (ref 3.80–5.10)
RDW: 12.5 % (ref 11.0–15.0)
TOTAL LYMPHOCYTE: 17.3 %
WBC mixed population: 635 cells/uL (ref 200–950)
WBC: 7.3 10*3/uL (ref 3.8–10.8)

## 2016-10-26 LAB — BASIC METABOLIC PANEL WITH GFR
BUN: 12 mg/dL (ref 7–25)
CHLORIDE: 93 mmol/L — AB (ref 98–110)
CO2: 31 mmol/L (ref 20–32)
Calcium: 10.1 mg/dL (ref 8.6–10.4)
Creat: 0.8 mg/dL (ref 0.60–0.93)
GFR, Est African American: 85 mL/min/{1.73_m2} (ref >=60)
GFR, Est Non African American: 73 mL/min/{1.73_m2} (ref >=60)
GLUCOSE: 105 mg/dL — AB (ref 65–99)
POTASSIUM: 4.2 mmol/L (ref 3.5–5.3)
Sodium: 134 mmol/L — ABNORMAL LOW (ref 135–146)

## 2016-10-26 LAB — HEPATIC FUNCTION PANEL
AG Ratio: 1.6 (calc) (ref 1.0–2.5)
ALT: 21 U/L (ref 6–29)
AST: 28 U/L (ref 10–35)
Albumin: 4.5 g/dL (ref 3.6–5.1)
Alkaline phosphatase (APISO): 64 U/L (ref 33–130)
BILIRUBIN DIRECT: 0.2 mg/dL (ref 0.0–0.2)
BILIRUBIN INDIRECT: 1 mg/dL (ref 0.2–1.2)
BILIRUBIN TOTAL: 1.2 mg/dL (ref 0.2–1.2)
GLOBULIN: 2.9 g/dL (ref 1.9–3.7)
Total Protein: 7.4 g/dL (ref 6.1–8.1)

## 2016-10-26 LAB — MAGNESIUM: MAGNESIUM: 1.7 mg/dL (ref 1.5–2.5)

## 2016-10-26 LAB — LIPID PANEL
CHOL/HDL RATIO: 2 (calc) (ref <5.0)
CHOLESTEROL: 210 mg/dL — AB (ref <200)
HDL: 106 mg/dL (ref >50)
LDL Cholesterol (Calc): 87 mg/dL (calc)
NON-HDL CHOLESTEROL (CALC): 104 mg/dL (ref <130)
Triglycerides: 80 mg/dL (ref <150)

## 2016-10-26 LAB — TSH: TSH: 2.31 m[IU]/L (ref 0.40–4.50)

## 2016-10-26 MED ORDER — PANTOPRAZOLE SODIUM 40 MG PO TBEC: 40.0000 mg | DELAYED_RELEASE_TABLET | Freq: Every day | ORAL | 0 refills | Status: DC

## 2016-10-26 NOTE — Progress Notes (Signed)
Pt aware of lab results & voiced understanding of those results.

## 2016-12-09 ENCOUNTER — Other Ambulatory Visit: Payer: Self-pay | Admitting: Internal Medicine

## 2016-12-09 DIAGNOSIS — E785 Hyperlipidemia, unspecified: Secondary | ICD-10-CM

## 2016-12-11 ENCOUNTER — Telehealth: Payer: Self-pay | Admitting: *Deleted

## 2016-12-11 NOTE — Telephone Encounter (Signed)
Patient is having surgery next week and asked if it is OK to stop her ASA 81 mg until after the surgery and restart afterward.  Per Dr Oneta Rack, it is OK.

## 2016-12-19 ENCOUNTER — Other Ambulatory Visit: Payer: Self-pay | Admitting: Otolaryngology

## 2016-12-22 ENCOUNTER — Encounter: Payer: Self-pay | Admitting: Internal Medicine

## 2016-12-24 ENCOUNTER — Emergency Department (HOSPITAL_COMMUNITY)
Admission: EM | Admit: 2016-12-24 | Discharge: 2016-12-24 | Disposition: A | Discharge status: Home / Self Care | Payer: Medicare Other | Attending: Emergency Medicine | Admitting: Emergency Medicine

## 2016-12-24 ENCOUNTER — Emergency Department (HOSPITAL_COMMUNITY): Payer: Medicare Other

## 2016-12-24 ENCOUNTER — Encounter (HOSPITAL_COMMUNITY): Payer: Self-pay | Admitting: Emergency Medicine

## 2016-12-24 DIAGNOSIS — Z79899 Other long term (current) drug therapy: Secondary | ICD-10-CM | POA: Insufficient documentation

## 2016-12-24 DIAGNOSIS — I10 Essential (primary) hypertension: Secondary | ICD-10-CM | POA: Diagnosis not present

## 2016-12-24 DIAGNOSIS — R110 Nausea: Secondary | ICD-10-CM | POA: Insufficient documentation

## 2016-12-24 DIAGNOSIS — E876 Hypokalemia: Secondary | ICD-10-CM | POA: Diagnosis not present

## 2016-12-24 DIAGNOSIS — K5641 Fecal impaction: Secondary | ICD-10-CM | POA: Diagnosis not present

## 2016-12-24 DIAGNOSIS — R5383 Other fatigue: Secondary | ICD-10-CM | POA: Insufficient documentation

## 2016-12-24 DIAGNOSIS — Z87891 Personal history of nicotine dependence: Secondary | ICD-10-CM | POA: Insufficient documentation

## 2016-12-24 DIAGNOSIS — K5900 Constipation, unspecified: Secondary | ICD-10-CM | POA: Diagnosis present

## 2016-12-24 LAB — COMPREHENSIVE METABOLIC PANEL
ALBUMIN: 4.1 g/dL (ref 3.5–5.0)
ALT: 41 U/L (ref 14–54)
AST: 31 U/L (ref 15–41)
Alkaline Phosphatase: 54 U/L (ref 38–126)
Anion gap: 10 (ref 5–15)
BUN: 11 mg/dL (ref 6–20)
CHLORIDE: 93 mmol/L — AB (ref 101–111)
CO2: 29 mmol/L (ref 22–32)
CREATININE: 0.73 mg/dL (ref 0.44–1.00)
Calcium: 9.9 mg/dL (ref 8.9–10.3)
GFR calc Af Amer: >60 mL/min (ref >60)
GLUCOSE: 119 mg/dL — AB (ref 65–99)
POTASSIUM: 3.4 mmol/L — AB (ref 3.5–5.1)
SODIUM: 132 mmol/L — AB (ref 135–145)
TOTAL PROTEIN: 7.5 g/dL (ref 6.5–8.1)
Total Bilirubin: 1.1 mg/dL (ref 0.3–1.2)

## 2016-12-24 LAB — CBC
HEMATOCRIT: 41.1 % (ref 36.0–46.0)
Hemoglobin: 14.3 g/dL (ref 12.0–15.0)
MCH: 31.6 pg (ref 26.0–34.0)
MCHC: 34.8 g/dL (ref 30.0–36.0)
MCV: 90.9 fL (ref 78.0–100.0)
PLATELETS: 263 10*3/uL (ref 150–400)
RBC: 4.52 MIL/uL (ref 3.87–5.11)
RDW: 12.3 % (ref 11.5–15.5)
WBC: 12.6 10*3/uL — ABNORMAL HIGH (ref 4.0–10.5)

## 2016-12-24 LAB — URINALYSIS, ROUTINE W REFLEX MICROSCOPIC
Bilirubin Urine: NEGATIVE
Glucose, UA: NEGATIVE mg/dL
Hgb urine dipstick: NEGATIVE
Ketones, ur: NEGATIVE mg/dL
Leukocytes, UA: NEGATIVE
Nitrite: NEGATIVE
PH: 7 (ref 5.0–8.0)
Protein, ur: 100 mg/dL — AB
SPECIFIC GRAVITY, URINE: 1.009 (ref 1.005–1.030)

## 2016-12-24 LAB — LIPASE, BLOOD: LIPASE: 49 U/L (ref 11–51)

## 2016-12-24 MED ORDER — ONDANSETRON HCL 4 MG/2ML IJ SOLN
4.0000 mg | Freq: Once | INTRAMUSCULAR | Status: AC
  Administered 2016-12-24: 4 mg via INTRAVENOUS
  Filled 2016-12-24: qty 2

## 2016-12-24 MED ORDER — POLYETHYLENE GLYCOL 3350 17 G PO PACK
17.0000 g | PACK | Freq: Every day | ORAL | Status: DC
  Administered 2016-12-24: 17 g via ORAL
  Filled 2016-12-24: qty 1

## 2016-12-24 MED ORDER — MILK AND MOLASSES ENEMA
1.0000 | Freq: Once | RECTAL | Status: AC
  Administered 2016-12-24: 250 mL via RECTAL
  Filled 2016-12-24: qty 250

## 2016-12-24 MED ORDER — POLYETHYLENE GLYCOL 3350 17 G PO PACK: 17.0000 g | PACK | Freq: Every day | ORAL | 0 refills | Status: DC

## 2016-12-24 MED ORDER — DOCUSATE SODIUM 250 MG PO CAPS: 250.0000 mg | ORAL_CAPSULE | Freq: Every day | ORAL | 0 refills | Status: DC

## 2016-12-24 MED ORDER — POTASSIUM CHLORIDE CRYS ER 20 MEQ PO TBCR
40.0000 meq | EXTENDED_RELEASE_TABLET | Freq: Once | ORAL | Status: AC
  Administered 2016-12-24: 40 meq via ORAL
  Filled 2016-12-24: qty 2

## 2016-12-24 MED ORDER — IOPAMIDOL (ISOVUE-300) INJECTION 61%
100.0000 mL | Freq: Once | INTRAVENOUS | Status: AC | PRN
  Administered 2016-12-24: 100 mL via INTRAVENOUS

## 2016-12-24 MED ORDER — IOPAMIDOL (ISOVUE-300) INJECTION 61%
INTRAVENOUS | Status: AC
  Filled 2016-12-24: qty 100

## 2016-12-24 MED ORDER — SODIUM CHLORIDE 0.9 % IV BOLUS (SEPSIS)
1000.0000 mL | Freq: Once | INTRAVENOUS | Status: AC
  Administered 2016-12-24: 1000 mL via INTRAVENOUS

## 2016-12-24 NOTE — ED Provider Notes (Signed)
[facility] Note   CSN: [id number] Arrival date & time: 12/24/16  1135     History   Chief Complaint Chief Complaint  Patient presents with  . Constipation    HPI Deanna Gomez is a 71 y.o. female.  HPI   71 year old female with past medical history of hypertension, hepatitis C, who presents with generalized abdominal pain and constipation.  The patient has a history of chronic constipation and has had hemorrhoid banding in the past.  The patient underwent an outpatient procedure 2 weeks ago.  She received fentanyl and Versed, or so she believes.  Since then, she has had progressively worsening abdominal pain and distention with constipation.  She has been taking stool softeners as well as laxatives with production of only a small amount of liquid stool.  She feels like she has had increasing abdominal distention over the same time.  She endorses a fullness sensation with the urge to go to the bathroom, but she is unable to produce a significant bowel movement.  She has associated aching cramping generalized abdominal pain.  Her indigestion is gotten worse but denies any chest pain.  Shortness of breath.  No fevers or chills.  No history of impaction or bowel obstruction.  Past Medical History:  Diagnosis Date  . Hepatitis C   . HTN (hypertension)   . Hyperlipidemia   . Obesity   . Pre-diabetes   . Scoliosis   . Tobacco use    stopped 6 years ago.  . Vitamin D deficiency     Patient Active Problem List   Diagnosis Date Noted  . Encounter for Medicare annual wellness exam 08/05/2015  . Gastric ulcer 11/19/2014  . Morbid obesity (BMI 35.46) 10/15/2014  . Medication management 10/13/2013  . Essential hypertension 03/09/2013  . Hyperlipidemia 03/09/2013  . Prediabetes 03/09/2013  . Vitamin D deficiency 03/09/2013  . Hepatitis C virus infection 03/09/2013    Past Surgical History:  Procedure Laterality Date  . BACK  SURGERY  1986   x 3  . BREAST RECONSTRUCTION     x 2  . CATARACT EXTRACTION     Bilateral    OB History    No data available       Home Medications    Prior to Admission medications   Medication Sig Start Date End Date Taking? Authorizing Provider  ALPRAZolam (XANAX) 0.5 MG tablet TAKE ONE-HALF TO ONE TABLET BY MOUTH UP TO THREE TIMES A DAY AS NEEDED FOR ANXIETY OR SLEEP 07/28/16  Yes Lucky Cowboy, MD  atorvastatin (LIPITOR) 80 MG tablet TAKE 1/2 TO 1 (ONE-HALF TO ONE) TABLET BY MOUTH DAILY FOR CHOLESTEROL OR AS DIRECTED 12/09/16  Yes Lucky Cowboy, MD  bisoprolol-hydrochlorothiazide [facility]) 5-6.25 MG tablet TAKE ONE TABLET BY MOUTH DAILY FOR BLOOD PRESSURE 07/28/16  Yes Lucky Cowboy, MD  ezetimibe (ZETIA) 10 MG tablet TAKE ONE TABLET BY MOUTH DAILY FOR CHOLESTEROL 10/26/16  Yes Lucky Cowboy, MD  losartan (COZAAR) 100 MG tablet TAKE ONE TABLET BY MOUTH DAILY 10/26/16  Yes Lucky Cowboy, MD  metroNIDAZOLE (METROCREAM) 0.75 % cream  05/06/15  Yes [provider]  nystatin (NYSTATIN) powder Apply daily after a shower as needed 08/05/15  Yes Quentin Mulling, PA-C  docusate sodium (COLACE) 250 MG capsule Take 1 capsule (250 mg total) by mouth daily. 12/24/16   Shaune Pollack, MD  polyethylene glycol [facility]) packet Take 17 g by mouth daily. Mix 4-6 packets into one 32 oz serving of water, Gatorade,  or juice. Drink this over the course of a day. If you do not produce significant stool, continue taking one packet two to three times a day. Once you have liquid stools, stop taking this regularly, only as needed for constipation. 12/24/16   Shaune PollackIsaacs, Kimberle Stanfill, MD    Family History Family History  Problem Relation Age of Onset  . Hypertension Mother   . Coronary artery disease Brother     Social History Social History  Substance Use Topics  . Smoking status: Former Smoker    Packs/day: 1.00    Years: 30.00    Types: Cigarettes    Quit date: 08/28/2004  . Smokeless  tobacco: Never Used     Comment: QUIT SIX YEARS AGO  . Alcohol use 7.0 oz/week    14 Standard drinks or equivalent per week     Comment: Drinks 2-3 glasses wine nightly x 20 years     Allergies   Patient has no known allergies.   Review of Systems Review of Systems  Constitutional: Positive for fatigue.  Gastrointestinal: Positive for abdominal distention, constipation and nausea.  All other systems reviewed and are negative.    Physical Exam Updated Vital Signs BP (!) 154/82 (BP Location: Right Arm)   Pulse 70   Temp 97.6 F (36.4 C) (Oral)   Resp 17   SpO2 98%   Physical Exam  Constitutional: She is oriented to person, place, and time. She appears well-developed and well-nourished. No distress.  HENT:  Head: Normocephalic and atraumatic.  Eyes: Conjunctivae are normal.  Neck: Neck supple.  Cardiovascular: Normal rate, regular rhythm and normal heart sounds.  Exam reveals no friction rub.   No murmur heard. Pulmonary/Chest: Effort normal and breath sounds normal. No respiratory distress. She has no wheezes. She has no rales.  Abdominal: Normal appearance. She exhibits no distension. There is generalized tenderness. There is no rigidity, no rebound and no guarding.  Genitourinary:  Genitourinary Comments: Large external hemorrhoid, nonthrombosed.  There are multiple suspected internal hemorrhoids without prolapse.  No overt obstruction of the anus.  No palpable stool burden.  Brown stool in the rectal vault. No palpable impaction.  Musculoskeletal: She exhibits no edema.  Neurological: She is alert and oriented to person, place, and time. She exhibits normal muscle tone.  Skin: Skin is warm. Capillary refill takes less than 2 seconds.  Psychiatric: She has a normal mood and affect.  Nursing note and vitals reviewed.    ED Treatments / Results  Labs (all labs ordered are listed, but only abnormal results are displayed) Labs Reviewed  COMPREHENSIVE METABOLIC PANEL -  Abnormal; Notable for the following:       Result Value   Sodium 132 (*)    Potassium 3.4 (*)    Chloride 93 (*)    Glucose, Bld 119 (*)    All other components within normal limits  CBC - Abnormal; Notable for the following:    WBC 12.6 (*)    All other components within normal limits  URINALYSIS, ROUTINE W REFLEX MICROSCOPIC - Abnormal; Notable for the following:    Protein, ur 100 (*)    Bacteria, UA RARE (*)    Squamous Epithelial / LPF 0-5 (*)    All other components within normal limits  LIPASE, BLOOD  POC OCCULT BLOOD, ED    EKG  EKG Interpretation None       Radiology Ct Abdomen Pelvis W Contrast  Result Date: 12/24/2016 CLINICAL DATA:  Abdominal distension. Rectal swelling. Obesity.  Hepatitis C. Hyperlipidemia. EXAM: CT ABDOMEN AND PELVIS WITH CONTRAST TECHNIQUE: Multidetector CT imaging of the abdomen and pelvis was performed using the standard protocol following bolus administration of intravenous contrast. CONTRAST:  ISOVUE-300 IOPAMIDOL (ISOVUE-300) INJECTION 61% COMPARISON:  None FINDINGS: Lower chest: Left base scarring. Mild cardiomegaly, without pericardial or pleural effusion. Hepatobiliary: Moderate hepatic steatosis, without focal liver lesion. Variant lateral segment left liver lobe extending in the left upper quadrant. Multiple small gallstones without acute cholecystitis or biliary duct dilatation. Pancreas: Normal, without mass or ductal dilatation. Spleen: Normal in size, without focal abnormality. Adrenals/Urinary Tract: Normal adrenal glands. left extrarenal pelvis. Mild left-sided caliectasis. An upper pole right renal lesion measures 11 mm on coronal image 100 and transverse image 24/ series 2. Greater than fluid density. No distal hydroureter identified. Normal urinary bladder. Stomach/Bowel: Normal stomach, without wall thickening. Moderate amount of stool in the rectum. Normal terminal ileum. Normal small bowel. Vascular/Lymphatic: Advanced aortic  and branch vessel atherosclerosis. No abdominopelvic adenopathy. Reproductive: Normal uterus and adnexa.  Tubal ligation. Other: No significant free fluid. Musculoskeletal: Osteopenia. Lumbar spine fixation with convex left spine curvature. IMPRESSION: 1. Stool in the rectum suggests constipation or fecal impaction. 2. No other explanation for patient's symptoms. 3. **An incidental finding of potential clinical significance has been found. 11 mm upper pole right renal lesion could represent a complex cyst or a solid neoplasm. Consider further evaluation with pre and post contrast abdominal CT or MRI. Given patient body habitus and spine hardware, CT may be of high yield an MRI. This could be performed non emergently.** 4. Hepatic steatosis. 5.  Aortic Atherosclerosis (ICD10-I70.0). 6. Suspect mild left ureteropelvic junction obstruction, chronic. Electronically Signed   By: Jeronimo Greaves M.D.   On: 12/24/2016 19:07    Procedures Procedures (including critical care time)  Medications Ordered in ED Medications  iopamidol (ISOVUE-300) 61 % injection (not administered)  iopamidol (ISOVUE-300) 61 % injection (not administered)  polyethylene glycol (MIRALAX / GLYCOLAX) packet 17 g (17 g Oral Given 12/24/16 2032)  sodium chloride 0.9 % bolus 1,000 mL (0 mLs Intravenous Stopped 12/24/16 1907)  potassium chloride SA (K-DUR,KLOR-CON) CR tablet 40 mEq (40 mEq Oral Given 12/24/16 1630)  ondansetron (ZOFRAN) injection 4 mg (4 mg Intravenous Given 12/24/16 1630)  iopamidol (ISOVUE-300) 61 % injection 100 mL (100 mLs Intravenous Contrast Given 12/24/16 1748)  milk and molasses enema (250 mLs Rectal Given 12/24/16 2027)     Initial Impression / Assessment and Plan / ED Course  I have reviewed the triage vital signs and the nursing notes.  Pertinent labs & imaging results that were available during my care of the patient were reviewed by me and considered in my medical decision making (see chart for  details).  Clinical Course as of Dec 26 31  Wynelle Link Dec 24, 2016  1553 Albumin: 4.1 [CI]    Clinical Course User Index [CI] Shaune Pollack, MD    71 year old female with past medical history as above including chronic constipation with hemorrhoids who presents with generalized abdominal pain and constipation after outpatient surgery.  I suspect patient has constipation and possible fecal impaction.  This is not immediately identified on rectal exam but history is consistent with this.  CT scan obtained given mild leukocytosis, and shows fecal impaction without acute complication.  I suspect her leukocytosis is likely reactive secondary to her pain and impaction.  Patient given enema and MiraLAX in the ED with excellent effect.  Patient had a large, firm bowel movement and  feels significantly improved.  Her repeat abdominal exam is benign.  Will discharge with good bowel regimen, stool softeners, and outpatient follow-up.  Final Clinical Impressions(s) / ED Diagnoses   Final diagnoses:  Fecal impaction (HCC)  Hypokalemia    New Prescriptions Discharge Medication List as of 12/24/2016 10:11 PM    START taking these medications   Details  docusate sodium (COLACE) 250 MG capsule Take 1 capsule (250 mg total) by mouth daily., Starting Sun 12/24/2016, Print    polyethylene glycol (MIRALAX) packet Take 17 g by mouth daily. Mix 4-6 packets into one 32 oz serving of water, Gatorade, or juice. Drink this over the course of a day. If you do not produce significant stool, continue taking one packet two to three times a day. Once you have liquid stools,  stop taking this regularly, only as needed for constipation., Starting Sun 12/24/2016, Print         Shaune Pollack, MD 12/25/16 [phone]

## 2016-12-24 NOTE — ED Triage Notes (Addendum)
Pt reports LBM was on Tuesday night. Pt tried OTC medication for constipation with no relief. Pt has had hemorrhoids have been swollen since Friday since then. Pt also reports her rectum is swollen and is only oozing fluid. Pt does have urge to have BM, but cannot pass stool. No n/v. Pt reports she has been under stress for the past week and had outpatient throat surgery on Tuesday. Pt reports she thinks she may have gotten a medication for sedation during the procedure that made constipation worse. Also has had hemorrhoids banded several years ago.

## 2016-12-24 NOTE — Discharge Instructions (Signed)
Your lab work shows low potassium. I've included a copy of instructions on foods that are high in potassium. Make sure this is re-checked at your doctor.   Starting tomorrow, take Miralax as prescribed.  Drink plenty of fluids.  Start taking colace daily, to prevent constipation.  Of note, your CT scan did show a cyst/lesion on the kidney. This is very common but needs follow-up with Dr. Oneta RackMcKeown. I've included a copy of the results:  An incidental finding of potential clinical significance has been found. 11 mm upper pole right renal lesion could represent a complex cyst or a solid neoplasm. Consider further evaluation with pre and post contrast abdominal CT or MRI. Given patient body habitus and spine hardware, CT may be of high yield an MRI. This could be performed non emergently

## 2016-12-25 LAB — POC OCCULT BLOOD, ED: FECAL OCCULT BLD: NEGATIVE

## 2016-12-26 DIAGNOSIS — K760 Fatty (change of) liver, not elsewhere classified: Secondary | ICD-10-CM

## 2016-12-26 DIAGNOSIS — I700 Atherosclerosis of aorta: Secondary | ICD-10-CM

## 2016-12-26 HISTORY — DX: Fatty (change of) liver, not elsewhere classified: K76.0

## 2016-12-26 HISTORY — DX: Atherosclerosis of aorta: I70.0

## 2016-12-26 NOTE — Progress Notes (Signed)
ER follow up  Assessment and Plan: Hospital visit follow up for: fecal impaction Hospital discharge meds were reviewed, and reconciled with the patient.   Deanna Gomez was seen today for hospitalization follow-up.  Diagnoses and all orders for this visit:  Other constipation Discussed diet/lifestyle, information provided, medications reviewed.  Patient to call should she have no BM for 3 days.   Abdominal aortic atherosclerosis (HCC) Continue treatment and monitoring of blood pressure and cholesterol, recommended low cholesterol diet and regular exercise.   Renal lesion Comments: 11 mm lesion on upper pole of R kideny noted incidentally on CT 12/24/2016 Orders: -     CT ABDOMEN PELVIS W WO CONTRAST; Future -     Will refer to urology as indicated  Over 40 minutes of exam, counseling, chart review, and complex, high/moderate level critical decision making was performed this visit.   Future Appointments Date Time Provider Department Center  01/31/2017 10:00 AM Lucky Cowboy, MD [facility] None     HPI BP 140/90   Pulse 64   Temp (!) 96.1 F (35.6 C)   Ht 5' 5.75" (1.67 m)   Wt 217 lb (98.4 kg)   SpO2 96%   BMI 35.29 kg/m   71 y.o.female presents for follow up for transition from recent ER visit on 12/24/2016. The discharge summary, medications, and diagnostic test results were reviewed before meeting with the patient. The patient was diagnoses with a fecal impaction. Patient given enema and MiraLAX in the ED with excellent effect.  Patient had a large, firm bowel movement and feels significantly improved.  Her repeat abdominal exam was benign, and was discharged with good bowel regimen, stool softeners, and outpatient follow-up. Today she presents with somewhat irregular BM patterns (alternating between very soft stools and small formed stools) with some questions about prescribed medications (miralax and colace). These were discussed in length with the patient, along with  normal expected patterns for the next few weeks. Denies N/V, abdominal pain. This episode seems to have been triggered by opioids/anesthetic agents administered to the patient for an outpatient procedure performed on 12/19/2016 that is noted located in our records. The patient has agreed to obtain these so we may add them to her EMR for future reference.   Significantly noted on CT obtained in ER:  11 mm upper pole right renal lesion could represent a complex cyst or a solid neoplasm. Consider further evaluation with pre and post contrast abdominal CT or MRI. Given patient body habitus and spine hardware, CT may be of high yield an MRI. CT ordered today, with plans for urology referral as indicated.   This was discussed with patient; CT will be ordered with possible referral to urology.    Images while in the hospital: Ct Abdomen Pelvis W Contrast  Result Date: 12/24/2016 CLINICAL DATA:  Abdominal distension. Rectal swelling. Obesity. Hepatitis C. Hyperlipidemia. EXAM: CT ABDOMEN AND PELVIS WITH CONTRAST TECHNIQUE: Multidetector CT imaging of the abdomen and pelvis was performed using the standard protocol following bolus administration of intravenous contrast. CONTRAST:  ISOVUE-300 IOPAMIDOL (ISOVUE-300) INJECTION 61% COMPARISON:  None FINDINGS: Lower chest: Left base scarring. Mild cardiomegaly, without pericardial or pleural effusion. Hepatobiliary: Moderate hepatic steatosis, without focal liver lesion. Variant lateral segment left liver lobe extending in the left upper quadrant. Multiple small gallstones without acute cholecystitis or biliary duct dilatation. Pancreas: Normal, without mass or ductal dilatation. Spleen: Normal in size, without focal abnormality. Adrenals/Urinary Tract: Normal adrenal glands. left extrarenal pelvis. Mild left-sided caliectasis. An upper pole  right renal lesion measures 11 mm on coronal image 100 and transverse image 24/ series 2. Greater than fluid density. No  distal hydroureter identified. Normal urinary bladder. Stomach/Bowel: Normal stomach, without wall thickening. Moderate amount of stool in the rectum. Normal terminal ileum. Normal small bowel. Vascular/Lymphatic: Advanced aortic and branch vessel atherosclerosis. No abdominopelvic adenopathy. Reproductive: Normal uterus and adnexa.  Tubal ligation. Other: No significant free fluid. Musculoskeletal: Osteopenia. Lumbar spine fixation with convex left spine curvature. IMPRESSION: 1. Stool in the rectum suggests constipation or fecal impaction. 2. No other explanation for patient's symptoms. 3. **An incidental finding of potential clinical significance has been found. 11 mm upper pole right renal lesion could represent a complex cyst or a solid neoplasm. Consider further evaluation with pre and post contrast abdominal CT or MRI. Given patient body habitus and spine hardware, CT may be of high yield an MRI. This could be performed non emergently.** 4. Hepatic steatosis. 5.  Aortic Atherosclerosis (ICD10-I70.0). 6. Suspect mild left ureteropelvic junction obstruction, chronic. Electronically Signed   By: Jeronimo GreavesKyle  Talbot M.D.   On: 12/24/2016 19:07    Past Medical History:  Diagnosis Date  . Hepatitis C   . HTN (hypertension)   . Hyperlipidemia   . Obesity   . Pre-diabetes   . Scoliosis   . Tobacco use    stopped 6 years ago.  . Vitamin D deficiency      No Known Allergies    Current Outpatient Prescriptions on File Prior to Visit  Medication Sig Dispense Refill  . ALPRAZolam (XANAX) 0.5 MG tablet TAKE ONE-HALF TO ONE TABLET BY MOUTH UP TO THREE TIMES A DAY AS NEEDED FOR ANXIETY OR SLEEP 90 tablet 1  . atorvastatin (LIPITOR) 80 MG tablet TAKE 1/2 TO 1 (ONE-HALF TO ONE) TABLET BY MOUTH DAILY FOR CHOLESTEROL OR AS DIRECTED 90 tablet 1  . bisoprolol-hydrochlorothiazide (ZIAC) 5-6.25 MG tablet TAKE ONE TABLET BY MOUTH DAILY FOR BLOOD PRESSURE 90 tablet 1  . docusate sodium (COLACE) 250 MG capsule Take 1  capsule (250 mg total) by mouth daily. 10 capsule 0  . ezetimibe (ZETIA) 10 MG tablet TAKE ONE TABLET BY MOUTH DAILY FOR CHOLESTEROL 90 tablet 1  . losartan (COZAAR) 100 MG tablet TAKE ONE TABLET BY MOUTH DAILY 90 tablet 1  . metroNIDAZOLE (METROCREAM) 0.75 % cream     . nystatin (NYSTATIN) powder Apply daily after a shower as needed 60 g 2  . polyethylene glycol (MIRALAX) packet Take 17 g by mouth daily. Mix 4-6 packets into one 32 oz serving of water, Gatorade, or juice. Drink this over the course of a day. If you do not produce significant stool, continue taking one packet two to three times a day. Once you have liquid stools, stop taking this regularly, only as needed for constipation. 30 each 0   Current Facility-Administered Medications on File Prior to Visit  Medication Dose Route Frequency Provider Last Rate Last Dose  . dexamethasone (DECADRON) injection 10 mg  10 mg Intramuscular Once Quentin Mullingollier, Amanda, PA-C        ROS: all negative except above.   Physical Exam: Filed Weights   12/27/16 1010  Weight: 217 lb (98.4 kg)   BP 140/90   Pulse 64   Temp (!) 96.1 F (35.6 C)   Ht 5' 5.75" (1.67 m)   Wt 217 lb (98.4 kg)   SpO2 96%   BMI 35.29 kg/m  General Appearance: Well nourished, in no apparent distress. Neck: Supple, thyroid normal.  Respiratory: Respiratory effort normal, BS equal bilaterally without rales, rhonchi, wheezing or stridor.  Cardio: RRR with no MRGs. Brisk peripheral pulses without edema.  Abdomen: Soft, + BS.  Non tender, no guarding, rebound, hernias, masses. Lymphatics: Non tender without lymphadenopathy.  Musculoskeletal: Full ROM, 5/5 strength, normal gait.  Skin: Warm, dry without rashes, lesions, ecchymosis.  Neuro: Cranial nerves intact. Normal muscle tone, no cerebellar symptoms. Sensation intact.  Psych: Awake and oriented X 3, normal affect, Insight and Judgment appropriate.     Dan Maker, NP 10:25 AM [facility]  [facility]

## 2016-12-27 ENCOUNTER — Encounter: Payer: Self-pay | Admitting: Adult Health

## 2016-12-27 ENCOUNTER — Ambulatory Visit (INDEPENDENT_AMBULATORY_CARE_PROVIDER_SITE_OTHER): Payer: Medicare Other | Admitting: Adult Health

## 2016-12-27 VITALS — BP 140/90 | HR 64 | Temp 96.1°F | Ht 65.75 in | Wt 217.0 lb

## 2016-12-27 DIAGNOSIS — I700 Atherosclerosis of aorta: Secondary | ICD-10-CM | POA: Diagnosis not present

## 2016-12-27 DIAGNOSIS — N289 Disorder of kidney and ureter, unspecified: Secondary | ICD-10-CM | POA: Diagnosis not present

## 2016-12-27 DIAGNOSIS — K5909 Other constipation: Secondary | ICD-10-CM

## 2016-12-27 NOTE — Patient Instructions (Signed)

## 2017-01-02 ENCOUNTER — Other Ambulatory Visit: Payer: Medicare Other

## 2017-01-03 ENCOUNTER — Other Ambulatory Visit: Payer: Medicare Other

## 2017-01-03 ENCOUNTER — Ambulatory Visit
Admission: RE | Admit: 2017-01-03 | Discharge: 2017-01-03 | Disposition: A | Discharge status: Home / Self Care | Payer: Medicare Other | Source: Ambulatory Visit | Attending: Adult Health | Admitting: Adult Health

## 2017-01-03 DIAGNOSIS — N289 Disorder of kidney and ureter, unspecified: Secondary | ICD-10-CM

## 2017-01-03 MED ORDER — IOPAMIDOL (ISOVUE-300) INJECTION 61%
125.0000 mL | Freq: Once | INTRAVENOUS | Status: AC | PRN
  Administered 2017-01-03: 125 mL via INTRAVENOUS

## 2017-01-04 ENCOUNTER — Ambulatory Visit (INDEPENDENT_AMBULATORY_CARE_PROVIDER_SITE_OTHER): Payer: Medicare Other | Admitting: *Deleted

## 2017-01-04 DIAGNOSIS — Z23 Encounter for immunization: Secondary | ICD-10-CM

## 2017-01-30 ENCOUNTER — Encounter: Payer: Self-pay | Admitting: Internal Medicine

## 2017-01-30 NOTE — Patient Instructions (Signed)

## 2017-01-30 NOTE — Progress Notes (Signed)
Deanna Gomez\ [facility] Deanna Gomez, M.D.     Deanna Gomez, P.A.-C Deanna GaudierAshley Corbett, DNP [facility] [address] [place], [place] [id number] Telephone [phone] Telefax [phone] Annual Screening/Preventative Visit & Comprehensive Evaluation &  Examination     This very nice 71 y.o. MWF presents for a Screening/Preventative Visit & comprehensive evaluation and management of multiple medical co-morbidities.  Patient has been followed for HTN, T2_NIDDM  Prediabetes, Hyperlipidemia and Vitamin D Deficiency. Other problems include Chronic LBP consequent  of severe thoracolumbar scoliosis s/p DDD fusion surgery x 3. She also was treated for Hepatitis C to remission in 1987.       HTN predates since 1996. Patient's BP has been controlled at home and patient denies any cardiac symptoms as chest pain, palpitations, shortness of breath, dizziness or ankle swelling.  Negative Myoview in 2013.  She hasCKD2 (GFR 73 in Aug). Today's BP was elevated at 164/8 . Rechecked at 177/99 & P 64.      Patient's hyperlipidemia is controlled with diet and medications. Patient denies myalgias or other medication SE's. Last lipids were at goal: Lab Results  Component Value Date   CHOL 210 (H) 10/25/2016   HDL 106 10/25/2016   LDLCALC 98 07/06/2016   TRIG 80 10/25/2016   CHOLHDL 2.0 10/25/2016      Patient has Morbid obesity (BMI 35+) and prediabetes (A1c 5.8% in 2016) and patient denies reactive hypoglycemic symptoms, visual blurring, diabetic polys, or paresthesias. Last A1c was normal & at goal: Lab Results  Component Value Date   HGBA1C 5.3 07/06/2016      Finally, patient has  history of Vitamin D Deficiency ("12" / 2008) and last Vitamin D was at goal: Lab Results  Component Value Date   VD25OH 83 07/06/2016   Current Outpatient Medications on File Prior to Visit  Medication Sig  . ALPRAZolam (XANAX) 0.5 MG tablet TAKE ONE-HALF  TO ONE TABLET BY MOUTH UP TO THREE TIMES A DAY AS NEEDED FOR ANXIETY OR SLEEP  . atorvastatin (LIPITOR) 80 MG tablet TAKE 1/2 TO 1 (ONE-HALF TO ONE) TABLET BY MOUTH DAILY FOR CHOLESTEROL OR AS DIRECTED  . bisoprolol-hydrochlorothiazide (ZIAC) 5-6.25 MG tablet TAKE ONE TABLET BY MOUTH DAILY FOR BLOOD PRESSURE  . ezetimibe (ZETIA) 10 MG tablet TAKE ONE TABLET BY MOUTH DAILY FOR CHOLESTEROL  . losartan (COZAAR) 100 MG tablet TAKE ONE TABLET BY MOUTH DAILY  . metroNIDAZOLE (METROCREAM) 0.75 % cream   . nystatin (NYSTATIN) powder Apply daily after a shower as needed   Current Facility-Administered Medications on File Prior to Visit  Medication  . dexamethasone (DECADRON) injection 10 mg   No Known Allergies Past Medical History:  Diagnosis Date  . Hepatitis C   . HTN (hypertension)   . Hyperlipidemia   . Obesity   . Pre-diabetes   . Scoliosis   . Tobacco use    stopped 6 years ago.  . Vitamin D deficiency    Health Maintenance  Topic Date Due  . TETANUS/TDAP  04/06/2017 (Originally 03/06/2016)  . MAMMOGRAM  06/23/2018  . COLONOSCOPY  10/28/2024  . INFLUENZA VACCINE  Completed  . DEXA SCAN  Completed  . Hepatitis C Screening  Completed  . PNA vac Low Risk Adult  Completed   Immunization History  Administered Date(s) Administered  . DTaP 03/06/2006  . Influenza Split 11/20/2013, 12/24/2014  . Influenza Whole 12/19/2012  . Influenza, High Dose Seasonal PF 01/04/2017  . Pneumococcal Conjugate-13 05/13/2014  .  Pneumococcal Polysaccharide-23 03/06/2008, 10/25/2016  . Typhoid Live 03/06/2004  . Zoster 06/11/2007   Past Surgical History:  Procedure Laterality Date  . BACK SURGERY  1986   x 3  . BREAST RECONSTRUCTION     x 2  . CATARACT EXTRACTION     Bilateral   Family History  Problem Relation Age of Onset  . Hypertension Mother   . Coronary artery disease Brother    Social History   Tobacco Use  . Smoking status: Former Smoker    Packs/day: 1.00    Years: 30.00     Pack years: 30.00    Types: Cigarettes    Last attempt to quit: 08/28/2004    Years since quitting: 12.4  . Smokeless tobacco: Never Used  . Tobacco comment: QUIT SIX YEARS AGO  Substance Use Topics  . Alcohol use: Yes    Alcohol/week: 7.0 oz    Types: 14 Standard drinks or equivalent per week    Comment: Drinks 2-3 glasses wine nightly x 20 years  . Drug use: No    ROS Constitutional: Denies fever, chills, weight loss/gain, headaches, insomnia,  night sweats, and change in appetite. Does c/o fatigue. Eyes: Denies redness, blurred vision, diplopia, discharge, itchy, watery eyes.  ENT: Denies discharge, congestion, post nasal drip, epistaxis, sore throat, earache, hearing loss, dental pain, Tinnitus, Vertigo, Sinus pain, snoring.  Cardio: Denies chest pain, palpitations, irregular heartbeat, syncope, dyspnea, diaphoresis, orthopnea, PND, claudication, edema Respiratory: denies cough, dyspnea, DOE, pleurisy, hoarseness, laryngitis, wheezing.  Gastrointestinal: Denies dysphagia, heartburn, reflux, water brash, pain, cramps, nausea, vomiting, bloating, diarrhea, constipation, hematemesis, melena, hematochezia, jaundice, hemorrhoids Genitourinary: Denies dysuria, frequency, urgency, nocturia, hesitancy, discharge, hematuria, flank pain Breast: Breast lumps, nipple discharge, bleeding.  Musculoskeletal: Denies arthralgia, myalgia, stiffness, Jt. Swelling, pain, limp, and strain/sprain. Denies falls. Skin: Denies puritis, rash, hives, warts, acne, eczema, changing in skin lesion Neuro: No weakness, tremor, incoordination, spasms, paresthesia, pain Psychiatric: Denies confusion, memory loss, sensory loss. Denies Depression. Endocrine: Denies change in weight, skin, hair change, nocturia, and paresthesia, diabetic polys, visual blurring, hyper / hypo glycemic episodes.  Heme/Lymph: No excessive bleeding, bruising, enlarged lymph nodes.  Physical Exam  BP ( 162/80   P 60   T 97.3 F   R 18    Ht 5' 5.75"    Wt 220 lb 9.6 oz   BMI 35.88   Rechk BP  177/99 & P 64  General Appearance: Over nourished, well groomed and in no apparent distress.  Eyes: PERRLA, EOMs, conjunctiva no swelling or erythema, normal fundi and vessels. Sinuses: No frontal/maxillary tenderness ENT/Mouth: EACs patent / TMs  nl. Nares clear without erythema, swelling, mucoid exudates. Oral hygiene is good. No erythema, swelling, or exudate. Tongue normal, non-obstructing. Tonsils not swollen or erythematous. Hearing normal.  Neck: Supple, thyroid normal. No bruits, nodes or JVD. Respiratory: Respiratory effort normal.  BS equal and clear bilateral without rales, rhonci, wheezing or stridor. Cardio: Heart sounds are normal with regular rate and rhythm and no murmurs, rubs or gallops. Peripheral pulses are normal and equal bilaterally without edema. No aortic or femoral bruits. Chest: symmetric with normal excursions and percussion. Breasts: Rt Breast implant w/tatoo faux nipple and w/o any abnormal masses noted. Abdomen: Rotund & soft with bowel sounds active. Nontender, no guarding, rebound, hernias, masses, or organomegaly.  Lymphatics: Non tender without lymphadenopathy.  Musculoskeletal: Full ROM all peripheral extremities, joint stability, 5/5 strength, and normal gait. 16" thoracolumbar vertical scar w/straightening of lumbar lordosis. Skin: Warm and dry  without rashes, lesions, cyanosis, clubbing or  ecchymosis.  Neuro: Cranial nerves intact, reflexes equal bilaterally. Normal muscle tone, no cerebellar symptoms. Sensation intact.  Pysch: Alert and oriented X 3, normal affect, Insight and Judgment appropriate.   Assessment and Plan  1. Annual Preventative Screening Examination  2. Essential hypertension  - Increase Ziac -5 to to tabs qd til deplete and replace w/new Rx Ziac-10  - Monitor BP's and call if remain elevated  - EKG 12-Lead - US, RETROPERITNL ABD,  LTD - Urinalysis, Routine w reflex  microscopic - Microalbumin / creatinine urine ratio - CBC with Differential/Platelet - BASIC METABOLIC PANEL WITH GFR - Magnesium - TSH  3. Hyperlipidemia, mixed  - EKG 12-Lead - US, RETROPERITNL ABD,  LTD - Hepatic function panel - Lipid panel - TSH  4. Prediabetes  - EKG 12-Lead - US, RETROPERITNL ABD,  LTD - Hemoglobin A1c - Insulin, random  5. Vitamin D deficiency  - VITAMIN D 25 Hydroxy   6. Hepatitis C virus infection  - Hepatitis C RNA quantitative - Hepatic function panel  7. Abnormal glucose  - Hemoglobin A1c - Insulin, random  8. Screening for colorectal cancer  - POC Hemoccult Bld/Stl  9. Screening for ischemic heart disease  - EKG 12-Lead  10. Screening for AAA (aortic abdominal aneurysm)  - US, RETROPERITNL ABD,  LTD  11. Abdominal aortic atherosclerosis (HCC) - US, RETROPERITNL ABD,  LTD  12. Smoker  - US, RETROPERITNL ABD,  LTD  13. Medication management  - Urinalysis, Routine w reflex microscopic - Microalbumin / creatinine urine ratio - CBC with Differential/Platelet - BASIC METABOLIC PANEL WITH GFR - Hepatic function panel - Magnesium - Lipid panel - TSH - Hemoglobin A1c - Insulin, random - VITAMIN D 25 Hydrox  14. Depression, unspecified depression type  - buPROPion (WELLBUTRIN XL) 150 MG 24 hr tablet; Take 1 tablet every morning for Mood  Dispense: 30 tablet; Refill: 1       Patient was counseled in prudent diet to achieve/maintain BMI less than 25 for weight control, BP monitoring, regular exercise and medications. Discussed med's effects and SE's. Screening labs and tests as requested with regular follow-up as recommended. Over 40 minutes of exam, counseling, chart review and high complex critical decision making was performed.

## 2017-01-31 ENCOUNTER — Encounter: Payer: Self-pay | Admitting: Internal Medicine

## 2017-01-31 ENCOUNTER — Ambulatory Visit: Payer: Medicare Other | Admitting: Internal Medicine

## 2017-01-31 VITALS — BP 162/80 | HR 60 | Temp 97.3°F | Resp 18 | Ht 65.75 in | Wt 220.6 lb

## 2017-01-31 DIAGNOSIS — Z136 Encounter for screening for cardiovascular disorders: Secondary | ICD-10-CM | POA: Diagnosis not present

## 2017-01-31 DIAGNOSIS — I10 Essential (primary) hypertension: Secondary | ICD-10-CM | POA: Diagnosis not present

## 2017-01-31 DIAGNOSIS — R7309 Other abnormal glucose: Secondary | ICD-10-CM

## 2017-01-31 DIAGNOSIS — F32A Depression, unspecified: Secondary | ICD-10-CM

## 2017-01-31 DIAGNOSIS — Z79899 Other long term (current) drug therapy: Secondary | ICD-10-CM

## 2017-01-31 DIAGNOSIS — B1920 Unspecified viral hepatitis C without hepatic coma: Secondary | ICD-10-CM

## 2017-01-31 DIAGNOSIS — F329 Major depressive disorder, single episode, unspecified: Secondary | ICD-10-CM

## 2017-01-31 DIAGNOSIS — N390 Urinary tract infection, site not specified: Secondary | ICD-10-CM

## 2017-01-31 DIAGNOSIS — E559 Vitamin D deficiency, unspecified: Secondary | ICD-10-CM

## 2017-01-31 DIAGNOSIS — Z0000 Encounter for general adult medical examination without abnormal findings: Secondary | ICD-10-CM

## 2017-01-31 DIAGNOSIS — E782 Mixed hyperlipidemia: Secondary | ICD-10-CM

## 2017-01-31 DIAGNOSIS — Z0001 Encounter for general adult medical examination with abnormal findings: Secondary | ICD-10-CM

## 2017-01-31 DIAGNOSIS — Z1211 Encounter for screening for malignant neoplasm of colon: Secondary | ICD-10-CM

## 2017-01-31 DIAGNOSIS — F17200 Nicotine dependence, unspecified, uncomplicated: Secondary | ICD-10-CM

## 2017-01-31 DIAGNOSIS — I700 Atherosclerosis of aorta: Secondary | ICD-10-CM

## 2017-01-31 DIAGNOSIS — Z1212 Encounter for screening for malignant neoplasm of rectum: Secondary | ICD-10-CM

## 2017-01-31 DIAGNOSIS — R7303 Prediabetes: Secondary | ICD-10-CM

## 2017-01-31 MED ORDER — BUPROPION HCL ER (XL) 150 MG PO TB24: ORAL_TABLET | ORAL | 1 refills | Status: DC

## 2017-01-31 MED ORDER — BISOPROLOL-HYDROCHLOROTHIAZIDE 10-6.25 MG PO TABS: ORAL_TABLET | ORAL | 1 refills | Status: DC

## 2017-02-01 ENCOUNTER — Other Ambulatory Visit: Payer: Self-pay | Admitting: Internal Medicine

## 2017-02-01 DIAGNOSIS — N390 Urinary tract infection, site not specified: Secondary | ICD-10-CM

## 2017-02-01 NOTE — Addendum Note (Signed)
Addended by: Emerson MonteICKENS, Ryen Rhames S on: 02/01/2017 02:01 PM   Modules accepted: Orders

## 2017-02-02 LAB — HEPATIC FUNCTION PANEL
AG RATIO: 1.5 (calc) (ref 1.0–2.5)
ALBUMIN MSPROF: 4.5 g/dL (ref 3.6–5.1)
ALT: 22 U/L (ref 6–29)
AST: 27 U/L (ref 10–35)
Alkaline phosphatase (APISO): 64 U/L (ref 33–130)
BILIRUBIN DIRECT: 0.2 mg/dL (ref 0.0–0.2)
BILIRUBIN TOTAL: 0.9 mg/dL (ref 0.2–1.2)
Globulin: 3 g/dL (calc) (ref 1.9–3.7)
Indirect Bilirubin: 0.7 mg/dL (calc) (ref 0.2–1.2)
Total Protein: 7.5 g/dL (ref 6.1–8.1)

## 2017-02-02 LAB — CBC WITH DIFFERENTIAL/PLATELET
BASOS ABS: 38 {cells}/uL (ref 0–200)
Basophils Relative: 0.5 %
Eosinophils Absolute: 23 cells/uL (ref 15–500)
Eosinophils Relative: 0.3 %
HEMATOCRIT: 41.6 % (ref 35.0–45.0)
HEMOGLOBIN: 14.2 g/dL (ref 11.7–15.5)
LYMPHS ABS: 1643 {cells}/uL (ref 850–3900)
MCH: 31 pg (ref 27.0–33.0)
MCHC: 34.1 g/dL (ref 32.0–36.0)
MCV: 90.8 fL (ref 80.0–100.0)
MPV: 9.8 fL (ref 7.5–12.5)
Monocytes Relative: 7.7 %
NEUTROS ABS: 5220 {cells}/uL (ref 1500–7800)
NEUTROS PCT: 69.6 %
Platelets: 274 10*3/uL (ref 140–400)
RBC: 4.58 10*6/uL (ref 3.80–5.10)
RDW: 12.1 % (ref 11.0–15.0)
Total Lymphocyte: 21.9 %
WBC: 7.5 10*3/uL (ref 3.8–10.8)
WBCMIX: 578 {cells}/uL (ref 200–950)

## 2017-02-02 LAB — URINE CULTURE
MICRO NUMBER:: 81342979
Result:: NO GROWTH
SPECIMEN QUALITY: ADEQUATE

## 2017-02-02 LAB — MICROALBUMIN / CREATININE URINE RATIO
Creatinine, Urine: 49 mg/dL (ref 20–275)
MICROALB UR: 44.7 mg/dL
MICROALB/CREAT RATIO: 912 ug/mg{creat} — AB (ref <30)

## 2017-02-02 LAB — URINALYSIS, ROUTINE W REFLEX MICROSCOPIC
BACTERIA UA: NONE SEEN /HPF
Bilirubin Urine: NEGATIVE
GLUCOSE, UA: NEGATIVE
HYALINE CAST: NONE SEEN /LPF
Hgb urine dipstick: NEGATIVE
Ketones, ur: NEGATIVE
Leukocytes, UA: NEGATIVE
Nitrite: NEGATIVE
PH: 7.5 (ref 5.0–8.0)
RBC / HPF: NONE SEEN /HPF (ref 0–2)
SPECIFIC GRAVITY, URINE: 1.006 (ref 1.001–1.03)

## 2017-02-02 LAB — MAGNESIUM: Magnesium: 1.5 mg/dL (ref 1.5–2.5)

## 2017-02-02 LAB — BASIC METABOLIC PANEL WITH GFR
BUN: 11 mg/dL (ref 7–25)
CALCIUM: 10.1 mg/dL (ref 8.6–10.4)
CO2: 28 mmol/L (ref 20–32)
CREATININE: 0.84 mg/dL (ref 0.60–0.93)
Chloride: 95 mmol/L — ABNORMAL LOW (ref 98–110)
GFR, EST AFRICAN AMERICAN: 80 mL/min/{1.73_m2} (ref >=60)
GFR, EST NON AFRICAN AMERICAN: 69 mL/min/{1.73_m2} (ref >=60)
Glucose, Bld: 99 mg/dL (ref 65–99)
POTASSIUM: 4.3 mmol/L (ref 3.5–5.3)
Sodium: 137 mmol/L (ref 135–146)

## 2017-02-02 LAB — HEMOGLOBIN A1C
EAG (MMOL/L): 6.2 (calc)
HEMOGLOBIN A1C: 5.5 %{Hb} (ref <5.7)
MEAN PLASMA GLUCOSE: 111 (calc)

## 2017-02-02 LAB — INSULIN, RANDOM: Insulin: 15.6 u[IU]/mL (ref 2.0–19.6)

## 2017-02-02 LAB — LIPID PANEL
CHOL/HDL RATIO: 2.2 (calc) (ref <5.0)
Cholesterol: 249 mg/dL — ABNORMAL HIGH (ref <200)
HDL: 114 mg/dL (ref >50)
LDL CHOLESTEROL (CALC): 112 mg/dL — AB
NON-HDL CHOLESTEROL (CALC): 135 mg/dL — AB (ref <130)
TRIGLYCERIDES: 121 mg/dL (ref <150)

## 2017-02-02 LAB — VITAMIN D 25 HYDROXY (VIT D DEFICIENCY, FRACTURES): Vit D, 25-Hydroxy: 62 ng/mL (ref 30–100)

## 2017-02-02 LAB — TSH: TSH: 2.52 m[IU]/L (ref 0.40–4.50)

## 2017-02-02 LAB — HEPATITIS C RNA QUANTITATIVE
HCV QUANT LOG: NOT DETECTED {Log_IU}/mL
HCV RNA, PCR, QN: NOT DETECTED [IU]/mL

## 2017-03-08 ENCOUNTER — Ambulatory Visit: Payer: Medicare Other | Admitting: Internal Medicine

## 2017-03-08 VITALS — BP 172/86 | HR 64 | Temp 97.0°F | Resp 18 | Ht 65.75 in | Wt 222.4 lb

## 2017-03-08 DIAGNOSIS — I10 Essential (primary) hypertension: Secondary | ICD-10-CM

## 2017-03-08 MED ORDER — MINOXIDIL 10 MG PO TABS: ORAL_TABLET | ORAL | 3 refills | Status: DC

## 2017-03-08 NOTE — Patient Instructions (Signed)

## 2017-03-08 NOTE — Progress Notes (Signed)
  Subjective:    Patient ID: Deanna CowmanSuzanne B Gomez, female    DOB: Aug 18, 1945, 72 y.o.   MRN: [id number]  HPI   This nice 72 yo MWF with labile & recently accelerated HTN  To range of 180/100 and had Ziac-5 increased to 2 tabs transitioning to Ziac-10 along w/her Losartan 100 mg and BP's have remained elevated. She does report a sensation of Dysphoria & HA while BP's are elevated. She denies any other HT or CV sx's. Today BP is improved, but still elevated at 162/80.   Medication Sig  . ALPRAZolam ) 0.5 MG  TAKE 1/2-1 TAB UP TO THREE TIMES A DAY   . atorvastatin  80 MG TAKE 1/2 TO 1  TABLET  DAILY   . bisoprolol-hctz 10-6.25  Take 1 tablet every morning for BP  . buPROPion-XL 150 MG Take 1 tablet every morning for Mood  . ezetimibe  10 MG  TAKE ONE TAB DAILY FOR CHOLESTEROL  . losartan  100 MG  TAKE ONE TAB DAILY  . METROCREAM 0.75 %    . NYSTATIN powder Apply daily after a shower as needed   No Known Allergies   Past Medical History:  Diagnosis Date  . Hepatitis C   . HTN (hypertension)   . Hyperlipidemia   . Obesity   . Pre-diabetes   . Scoliosis   . Tobacco use    stopped 6 years ago.  . Vitamin D deficiency    Review of Systems  10 point systems review negative except as above.    Objective:   Physical Exam  BP (!) 172/86 Comment: 172/86-right/164/80-left  Pulse 64   Temp (!) 97 F (36.1 C)   Resp 18   Ht 5' 5.75" (1.67 m)   Wt 222 lb 6.4 oz (100.9 kg)   BMI 36.17 kg/m   HEENT - WNL. Neck - supple.  Chest - Clear equal BS. Cor - Nl HS. RRR w/o sig MGR. PP 1(+). No edema. MS- FROM w/o deformities.  Gait Nl. Neuro -  Nl w/o focal abnormalities.    Assessment & Plan:   1. Essential hypertension  - minoxidil (LONITEN) 10 MG tablet; Take 1/2 to 1 tablet daily as directed for BP  Dispense: 90 tablet; Refill: 3 - advised start 1/2 tab (5mg ) and monitor B'Ps 2 x/day and call if BP's remain elevated in 3 weeks.

## 2017-03-13 ENCOUNTER — Encounter: Payer: Self-pay | Admitting: Internal Medicine

## 2017-03-22 ENCOUNTER — Encounter: Payer: Self-pay | Admitting: Adult Health

## 2017-03-22 ENCOUNTER — Ambulatory Visit: Payer: Medicare Other | Admitting: Adult Health

## 2017-03-22 VITALS — BP 138/70 | HR 86 | Temp 97.6°F | Ht 65.75 in | Wt 228.0 lb

## 2017-03-22 DIAGNOSIS — S8011XA Contusion of right lower leg, initial encounter: Secondary | ICD-10-CM | POA: Diagnosis not present

## 2017-03-22 NOTE — Progress Notes (Signed)
Assessment and Plan:  Deanna ChessmanSuzanne was seen today for fall and leg injury.  Diagnoses and all orders for this visit:  Contusion of right lower leg, initial encounter      Anterior tibial contusion; low suspicion of fracture, cellulitis or other complication at this time May alternate ice/heat on area to improve circulation  May take OTC analgesics as needed for pain Monitor for increased heat, worsening pain, swelling or redness Go to the ER if any chest pain, shortness of breath, nausea, dizziness, severe HA, changes vision/speech  Further disposition pending results of labs. Discussed med's effects and SE's.   Over 15 minutes of exam, counseling, chart review, and critical decision making was performed.   Future Appointments  Date Time Provider Department Center  05/11/2017 11:00 AM Judd Gaudierorbett, [place] Dod[place], NP [facility] None  08/16/2017 11:30 AM Lucky CowboyMcKeown, William, MD [facility] None  02/06/2018 11:00 AM Lucky CowboyMcKeown, William, MD [facility] None    ------------------------------------------------------------------------------------------------------------------   HPI BP 138/70   Pulse 86   Temp 97.6 F (36.4 C)   Ht 5' 5.75" (1.67 m)   Wt 228 lb (103.4 kg)   SpO2 97%   BMI 37.08 kg/m   72 y.o.female presents for R lower extremity pain after a mechanical fall - she was distracted walking across a parking lot and struck her R shin 5 days ago. She presents with scab and small area of redness with very mild bruising surrounding area; no notable swelling or heat. She is able to bear weight and demonstrates normal gait with ambulation. Denies head injury, weakness, dizziness, frequent falls, problems with balance, fever/chills, chest pain, palpitations, dyspnea or cough.   Past Medical History:  Diagnosis Date  . Hepatitis C   . HTN (hypertension)   . Hyperlipidemia   . Obesity   . Pre-diabetes   . Scoliosis   . Tobacco use    stopped 6 years ago.  . Vitamin D deficiency      No Known  Allergies  Current Outpatient Medications on File Prior to Visit  Medication Sig  . ALPRAZolam (XANAX) 0.5 MG tablet TAKE ONE-HALF TO ONE TABLET BY MOUTH UP TO THREE TIMES A DAY AS NEEDED FOR ANXIETY OR SLEEP  . atorvastatin (LIPITOR) 80 MG tablet TAKE 1/2 TO 1 (ONE-HALF TO ONE) TABLET BY MOUTH DAILY FOR CHOLESTEROL OR AS DIRECTED  . bisoprolol-hydrochlorothiazide (ZIAC) 10-6.25 MG tablet Take 1 tablet every morning for BP  . buPROPion (WELLBUTRIN XL) 150 MG 24 hr tablet Take 1 tablet every morning for Mood  . ezetimibe (ZETIA) 10 MG tablet TAKE ONE TABLET BY MOUTH DAILY FOR CHOLESTEROL  . losartan (COZAAR) 100 MG tablet TAKE ONE TABLET BY MOUTH DAILY  . metroNIDAZOLE (METROCREAM) 0.75 % cream   . minoxidil (LONITEN) 10 MG tablet Take 1/2 to 1 tablet daily as directed for BP  . nystatin (NYSTATIN) powder Apply daily after a shower as needed   No current facility-administered medications on file prior to visit.     ROS: all negative except above.   Physical Exam:  BP 138/70   Pulse 86   Temp 97.6 F (36.4 C)   Ht 5' 5.75" (1.67 m)   Wt 228 lb (103.4 kg)   SpO2 97%   BMI 37.08 kg/m   General Appearance: Well nourished, in no apparent distress. Neck: Supple.  Respiratory: Respiratory effort normal, BS equal bilaterally without rales, rhonchi, wheezing or stridor.  Cardio: RRR with no MRGs. Brisk peripheral pulses without edema. Leg circumference symmetrical, no notable swelling of  ankles or lower legs.  Lymphatics: Non tender without lymphadenopathy.  Musculoskeletal: Full ROM, 5/5 strength, normal gait.  Skin: Warm, dry without rashes - scab over R upper shin - small surrounding area is injected without significant heat or swelling. Scant surrounding yellowing bruise.  Neuro: Normal muscle tone, no cerebellar symptoms. Sensation intact.  Psych: Awake and oriented X 3, normal affect, Insight and Judgment appropriate.     Dan Maker, NP 3:05 PM [facility]

## 2017-03-22 NOTE — Patient Instructions (Signed)
Monitor the redness; please call should this become worse, or if pain is getting worse and not better.   Go to the ER if any chest pain, shortness of breath, nausea, dizziness, severe HA, changes vision/speech  Contusion A contusion is a deep bruise. Contusions are the result of a blunt injury to tissues and muscle fibers under the skin. The injury causes bleeding under the skin. The skin overlying the contusion may turn blue, purple, or yellow. Minor injuries will give you a painless contusion, but more severe contusions may stay painful and swollen for a few weeks. What are the causes? This condition is usually caused by a blow, trauma, or direct force to an area of the body. What are the signs or symptoms? Symptoms of this condition include:  Swelling of the injured area.  Pain and tenderness in the injured area.  Discoloration. The area may have redness and then turn blue, purple, or yellow.  How is this diagnosed? This condition is diagnosed based on a physical exam and medical history. An X-ray, CT scan, or MRI may be needed to determine if there are any associated injuries, such as broken bones (fractures). How is this treated? Specific treatment for this condition depends on what area of the body was injured. In general, the best treatment for a contusion is resting, icing, applying pressure to (compression), and elevating the injured area. This is often called the RICE strategy. Over-the-counter anti-inflammatory medicines may also be recommended for pain control. Follow these instructions at home:  Rest the injured area.  If directed, apply ice to the injured area: ? Put ice in a plastic bag. ? Place a towel between your skin and the bag. ? Leave the ice on for 20 minutes, 2-3 times per day.  If directed, apply light compression to the injured area using an elastic bandage. Make sure the bandage is not wrapped too tightly. Remove and reapply the bandage as directed by your  health care provider.  If possible, raise (elevate) the injured area above the level of your heart while you are sitting or lying down.  Take over-the-counter and prescription medicines only as told by your health care provider. Contact a health care provider if:  Your symptoms do not improve after several days of treatment.  Your symptoms get worse.  You have difficulty moving the injured area. Get help right away if:  You have severe pain.  You have numbness in a hand or foot.  Your hand or foot turns pale or cold. This information is not intended to replace advice given to you by your health care provider. Make sure you discuss any questions you have with your health care provider. Document Released: 11/30/2004 Document Revised: 07/01/2015 Document Reviewed: 07/08/2014 Elsevier Interactive Patient Education  [employer]2018 [employer]

## 2017-03-27 ENCOUNTER — Ambulatory Visit: Payer: Medicare Other | Admitting: Internal Medicine

## 2017-03-27 VITALS — BP 136/74 | HR 64 | Temp 97.8°F | Resp 20 | Ht 65.75 in | Wt 226.0 lb

## 2017-03-27 DIAGNOSIS — I10 Essential (primary) hypertension: Secondary | ICD-10-CM | POA: Diagnosis not present

## 2017-03-27 NOTE — Progress Notes (Signed)
  Subjective:    Patient ID: Deanna CowmanSuzanne B Gomez, female    DOB: [birth year], 72 y.o.   MRN: [id number]  HPI  Patient is a delightful 72 yo MWF with labile HTN after a  fall with a minor contusion of her RLE. Patient reports BP's have been trending down and reviewed list are all essentially WNL. Patient denies any HA's, dizziness, CP, palpitations, but does endorse some dyspnea which sounds most consistent with deconditioning.   Medication Sig  . ALPRAZolam  0.5 MG tablet TAKE 1/2 -1 TAB UP TO THREE TIMES A DAY AS NEEDED FOR ANXIETY OR SLEEP  . atorvastatin  80 MG tablet TAKE 1/2 TO 1  TAB DAILY FOR CHOLESTEROL   . bisoprolol-hctz 10-6.25  Take 1 tablet every morning for BP  . buPROPion-XL 150 MG  Take 1 tablet every morning for Mood  . ezetimibe  10 MG tablet TAKE ONE TAB DAILY FOR CHOLESTEROL  . Losartan 100 MG tablet TAKE ONE TAB DAILY  . METROCREAM 0.75 % crm   . minoxidil  10 MG tablet Take 1/2 to 1 tablet daily as directed for BP  . nystatin  powder Apply daily after a shower as needed   No Known Allergies   Past Medical History:  Diagnosis Date  . Hepatitis C   . HTN (hypertension)   . Hyperlipidemia   . Obesity   . Pre-diabetes   . Scoliosis   . Tobacco use    stopped 6 years ago.  . Vitamin D deficiency    Past Surgical History:  Procedure Laterality Date  . BACK SURGERY  1986   x 3  . BREAST RECONSTRUCTION     x 2  . CATARACT EXTRACTION     Bilateral   Review of Systems  10 point systems review negative except as above.    Objective:   Physical Exam  BP 136/74   Pulse 64   Temp 97.8 F (36.6 C)   Resp 20   Ht 5' 5.75" (1.67 m)   Wt 226 lb (102.5 kg)   SpO2 99%   BMI 36.76 kg/m   HEENT - WNL. Neck - supple.  Chest - Clear equal BS. Cor - Nl HS. RRR w/o sig MGR. PP 1(+). No significant edema. MS- FROM w/o deformities.  Gait Nl. Neuro -  Nl w/o focal abnormalities.    Assessment & Plan:    1. Essential hypertension  - continue meds same   -  continue BP monitoring   - discussed meds / SE's.  - has f/u appt 05/11/2017

## 2017-03-31 ENCOUNTER — Encounter: Payer: Self-pay | Admitting: Internal Medicine

## 2017-04-02 ENCOUNTER — Other Ambulatory Visit: Payer: Self-pay | Admitting: Internal Medicine

## 2017-04-02 DIAGNOSIS — G4700 Insomnia, unspecified: Secondary | ICD-10-CM

## 2017-04-25 ENCOUNTER — Other Ambulatory Visit: Payer: Self-pay | Admitting: Internal Medicine

## 2017-04-27 ENCOUNTER — Other Ambulatory Visit: Payer: Self-pay | Admitting: Internal Medicine

## 2017-05-10 NOTE — Progress Notes (Signed)
FOLLOW UP  Assessment and Plan:   Dyspnea on exertion/pedal edema Grade 1 diastolic dysfunction on ECHO 2013; pedal edema ?SE from minoxidil so will taper off and adjust medications to see if this will resolve edema. Will proceed with repeat ECHO and cardiology referral for further eval as needed after discussion with patient per her preference.    Hypertension Well controlled with current medications - however will adjust due to ?SE of minoxidil - stop ziac, start atenolol 100 mg daily, hctz 25 mg daily, continue losartan 100 mg daily, taper minoxidil to 5 mg daily x 1 week then stop-  Monitor blood pressure at home; patient to call if consistently greater than 130/80 Continue DASH diet.   Reminder to go to the ER if any CP, SOB, nausea, dizziness, severe HA, changes vision/speech, left arm numbness and tingling and jaw pain.  Cholesterol Currently at goal; continue medications Continue low cholesterol diet and exercise.  Check lipid panel.   Other abnormal glucose Recent A1Cs at goal Discussed diet/exercise, weight management  Defer A1C; check BMP  Morbid Obesity with co morbidities Long discussion about weight loss, diet, and exercise Recommended diet heavy in fruits and veggies and low in animal meats, cheeses, and dairy products, appropriate calorie intake Discussed ideal weight for height Will follow up in 3 months  Vitamin D Def At goal at last visit; continue supplementation to maintain goal of 70-100 Defer Vit D level  Depression/anxiety Continue medications  Lifestyle discussed: diet/exerise, sleep hygiene, stress management, hydration  Hepatic steatosis LFTs; weight loss advised  Continue diet and meds as discussed. Further disposition pending results of labs. Discussed med's effects and SE's.   Over 30 minutes of exam, counseling, chart review, and critical decision making was performed.   Future Appointments  Date Time Provider Department Center  08/16/2017  11:30 AM Lucky Cowboy, MD [facility] None  02/06/2018 11:00 AM Lucky Cowboy, MD [facility] None    ----------------------------------------------------------------------------------------------------------------------  HPI 72 y.o. female  presents for 3 month follow up on hypertension, cholesterol, glucose management, morbid obesity and vitamin D deficiency. She has known hepatic steatosis which we are monitoring with hx of Hepatitis C in 1987 treated to remission in early 2000's.   She has been seen recently by Dr. Oneta Rack for poorly controlled htn; she was started on minoxidil 5 mg daily - however she has been taking BID; BPs are well controlled, however presenting with new pedal edema. Also concerned about new onset dyspnea with exertion which she reports is new in the last 2 months. She was evaluated by cardiology in 2013 with ECHO at that time demonstrated LV EF 60-65% with mild LVH pattern thickening of LV wall, grade 1 diastolic dysfunction, mild regurgitation of AV, calcified annulus of MV, mildly dilated LA. She has not followed up with cardiology. Denies CP, palpitations, cough, dizziness, fatigue, orthopnea/PND. Patient does have 30 pack year smoking hx, quit in 2006 -   she has a diagnosis of depression/anxiety and is currently on wellbutrin/ xanax, reports symptoms are well controlled on current regimen. she currently uses xanax mainly at night; rarely needs during the day. Discussed alternate methods for sleep.   BMI is Body mass index is 36.43 kg/m., she has been working on diet and exercise. Wt Readings from Last 3 Encounters:  05/11/17 224 lb (101.6 kg)  03/27/17 226 lb (102.5 kg)  03/22/17 228 lb (103.4 kg)   Her blood pressure has been controlled at home, today their BP is BP: 126/66  She does  workout. She denies chest pain, shortness of breath, dizziness.   She is on cholesterol medication (atorvastatin , zetia 10 mg daily) and denies myalgias. Her cholesterol is at  goal (total cholesterol elevated secondary to high HDL). The cholesterol last visit was:   Lab Results  Component Value Date   CHOL 249 (H) 01/31/2017   HDL 114 01/31/2017   LDLCALC 98 07/06/2016   TRIG 121 01/31/2017   CHOLHDL 2.2 01/31/2017    She has been working on diet and exercise for glucose management (hx of prediabetes), and denies foot ulcerations, increased appetite, nausea, paresthesia of the feet, polydipsia, polyuria, visual disturbances and vomiting. Last A1C in the office was:  Lab Results  Component Value Date   HGBA1C 5.5 01/31/2017   Patient is on Vitamin D supplement and near goal of 70 at recent check:    Lab Results  Component Value Date   VD25OH 62 01/31/2017       Current Medications:  Current Outpatient Medications on File Prior to Visit  Medication Sig  . ALPRAZolam (XANAX) 0.5 MG tablet TAKE ONE-HALF TO ONE TABLET BY MOUTH UP TO THREE TIMES A DAY AS NEEDED FOR ANXIETY OR SLEEP  . atorvastatin (LIPITOR) 80 MG tablet TAKE 1/2 TO 1 (ONE-HALF TO ONE) TABLET BY MOUTH DAILY FOR CHOLESTEROL OR AS DIRECTED  . bisoprolol-hydrochlorothiazide (ZIAC) 10-6.25 MG tablet Take 1 tablet every morning for BP  . ezetimibe (ZETIA) 10 MG tablet TAKE ONE TABLET BY MOUTH DAILY FOR CHOLESTEROL  . losartan (COZAAR) 100 MG tablet TAKE ONE TABLET BY MOUTH DAILY (Patient taking differently: TAKE ONE TABLET BY MOUTH AT NIGHT)  . metroNIDAZOLE (METROCREAM) 0.75 % cream   . minoxidil (LONITEN) 10 MG tablet Take 1/2 to 1 tablet daily as directed for BP (Patient taking differently: Take 1/2 to 1 tablet in the morning and 1/2 tablet in the evening)  . nystatin (NYSTATIN) powder Apply daily after a shower as needed  . buPROPion (WELLBUTRIN XL) 150 MG 24 hr tablet Take 1 tablet every morning for Mood (Patient not taking: Reported on 05/11/2017)   No current facility-administered medications on file prior to visit.      Allergies: No Known Allergies   Medical History:  Past Medical  History:  Diagnosis Date  . Hepatitis C   . HTN (hypertension)   . Hyperlipidemia   . Obesity   . Pre-diabetes   . Scoliosis   . Tobacco use    stopped 6 years ago.  . Vitamin D deficiency    Family history- Reviewed and unchanged Social history- Reviewed and unchanged   Review of Systems:  Review of Systems  Constitutional: Negative for malaise/fatigue and weight loss.  HENT: Negative for hearing loss and tinnitus.   Eyes: Negative for blurred vision and double vision.  Respiratory: Positive for shortness of breath (With exertion, new x 2 months). Negative for cough, hemoptysis, sputum production and wheezing.   Cardiovascular: Positive for leg swelling. Negative for chest pain, palpitations, orthopnea, claudication and PND.  Gastrointestinal: Negative for abdominal pain, blood in stool, constipation, diarrhea, heartburn, melena, nausea and vomiting.  Genitourinary: Negative.   Musculoskeletal: Negative for joint pain and myalgias.  Skin: Negative for rash.  Neurological: Negative for dizziness, tingling, sensory change, weakness and headaches.  Endo/Heme/Allergies: Negative for polydipsia.  Psychiatric/Behavioral: Negative.   All other systems reviewed and are negative.   Physical Exam: BP 126/66   Pulse 62   Temp 97.7 F (36.5 C)   Ht 5' 5.75" (1.67  m)   Wt 224 lb (101.6 kg)   SpO2 97%   BMI 36.43 kg/m  Wt Readings from Last 3 Encounters:  05/11/17 224 lb (101.6 kg)  03/27/17 226 lb (102.5 kg)  03/22/17 228 lb (103.4 kg)   General Appearance: Well nourished, in no apparent distress. Eyes: PERRLA, EOMs, conjunctiva no swelling or erythema Sinuses: No Frontal/maxillary tenderness ENT/Mouth: Ext aud canals clear, TMs without erythema, bulging. No erythema, swelling, or exudate on post pharynx.  Tonsils not swollen or erythematous. Hearing normal.  Neck: Supple, thyroid normal.  Respiratory: Respiratory effort normal, BS equal bilaterally without rales, rhonchi,  wheezing or stridor.  Cardio: RRR with no MRGs. 1+ symmetrical peripheral pulses with nonpitting edema to bilateral ankles and pedal area.  Abdomen: Soft, + BS.  Non tender, no guarding, rebound, hernias, masses. Lymphatics: Non tender without lymphadenopathy.  Musculoskeletal: Full ROM, 5/5 strength, Normal gait Skin: Warm, dry without rashes, lesions, ecchymosis.  Neuro: Cranial nerves intact. No cerebellar symptoms.  Psych: Awake and oriented X 3, normal affect, Insight and Judgment appropriate.    Dan Maker, NP 11:29 AM [facility]

## 2017-05-11 ENCOUNTER — Encounter: Payer: Self-pay | Admitting: Adult Health

## 2017-05-11 ENCOUNTER — Ambulatory Visit: Payer: Medicare Other | Admitting: Adult Health

## 2017-05-11 VITALS — BP 126/66 | HR 62 | Temp 97.7°F | Ht 65.75 in | Wt 224.0 lb

## 2017-05-11 DIAGNOSIS — Z79899 Other long term (current) drug therapy: Secondary | ICD-10-CM | POA: Diagnosis not present

## 2017-05-11 DIAGNOSIS — K760 Fatty (change of) liver, not elsewhere classified: Secondary | ICD-10-CM | POA: Diagnosis not present

## 2017-05-11 DIAGNOSIS — R7309 Other abnormal glucose: Secondary | ICD-10-CM | POA: Diagnosis not present

## 2017-05-11 DIAGNOSIS — E782 Mixed hyperlipidemia: Secondary | ICD-10-CM

## 2017-05-11 DIAGNOSIS — R0609 Other forms of dyspnea: Secondary | ICD-10-CM

## 2017-05-11 DIAGNOSIS — R600 Localized edema: Secondary | ICD-10-CM

## 2017-05-11 DIAGNOSIS — F419 Anxiety disorder, unspecified: Secondary | ICD-10-CM | POA: Diagnosis not present

## 2017-05-11 DIAGNOSIS — I10 Essential (primary) hypertension: Secondary | ICD-10-CM | POA: Diagnosis not present

## 2017-05-11 DIAGNOSIS — E559 Vitamin D deficiency, unspecified: Secondary | ICD-10-CM | POA: Diagnosis not present

## 2017-05-11 LAB — CBC WITH DIFFERENTIAL/PLATELET
BASOS PCT: 0.6 %
Basophils Absolute: 40 cells/uL (ref 0–200)
Eosinophils Absolute: 47 cells/uL (ref 15–500)
Eosinophils Relative: 0.7 %
HEMATOCRIT: 38 % (ref 35.0–45.0)
HEMOGLOBIN: 13.3 g/dL (ref 11.7–15.5)
LYMPHS ABS: 1085 {cells}/uL (ref 850–3900)
MCH: 30.9 pg (ref 27.0–33.0)
MCHC: 35 g/dL (ref 32.0–36.0)
MCV: 88.2 fL (ref 80.0–100.0)
MPV: 10.1 fL (ref 7.5–12.5)
Monocytes Relative: 10.7 %
Neutro Abs: 4811 cells/uL (ref 1500–7800)
Neutrophils Relative %: 71.8 %
PLATELETS: 285 10*3/uL (ref 140–400)
RBC: 4.31 10*6/uL (ref 3.80–5.10)
RDW: 11.6 % (ref 11.0–15.0)
Total Lymphocyte: 16.2 %
WBC: 6.7 10*3/uL (ref 3.8–10.8)
WBCMIX: 717 {cells}/uL (ref 200–950)

## 2017-05-11 LAB — BASIC METABOLIC PANEL WITH GFR
BUN: 14 mg/dL (ref 7–25)
CALCIUM: 10.3 mg/dL (ref 8.6–10.4)
CHLORIDE: 96 mmol/L — AB (ref 98–110)
CO2: 30 mmol/L (ref 20–32)
CREATININE: 0.84 mg/dL (ref 0.60–0.93)
GFR, EST AFRICAN AMERICAN: 80 mL/min/{1.73_m2} (ref >=60)
GFR, EST NON AFRICAN AMERICAN: 69 mL/min/{1.73_m2} (ref >=60)
Glucose, Bld: 100 mg/dL — ABNORMAL HIGH (ref 65–99)
Potassium: 4.5 mmol/L (ref 3.5–5.3)
Sodium: 135 mmol/L (ref 135–146)

## 2017-05-11 LAB — LIPID PANEL
CHOL/HDL RATIO: 2.4 (calc) (ref <5.0)
CHOLESTEROL: 187 mg/dL (ref <200)
HDL: 79 mg/dL (ref >50)
LDL CHOLESTEROL (CALC): 89 mg/dL
Non-HDL Cholesterol (Calc): 108 mg/dL (calc) (ref <130)
Triglycerides: 101 mg/dL (ref <150)

## 2017-05-11 LAB — HEPATIC FUNCTION PANEL
AG Ratio: 1.7 (calc) (ref 1.0–2.5)
ALBUMIN MSPROF: 4.7 g/dL (ref 3.6–5.1)
ALT: 16 U/L (ref 6–29)
AST: 23 U/L (ref 10–35)
Alkaline phosphatase (APISO): 61 U/L (ref 33–130)
BILIRUBIN DIRECT: 0.2 mg/dL (ref 0.0–0.2)
GLOBULIN: 2.8 g/dL (ref 1.9–3.7)
Indirect Bilirubin: 0.6 mg/dL (calc) (ref 0.2–1.2)
Total Bilirubin: 0.8 mg/dL (ref 0.2–1.2)
Total Protein: 7.5 g/dL (ref 6.1–8.1)

## 2017-05-11 LAB — TSH: TSH: 2.24 mIU/L (ref 0.40–4.50)

## 2017-05-11 MED ORDER — HYDROCHLOROTHIAZIDE 25 MG PO TABS: ORAL_TABLET | ORAL | 1 refills | Status: DC

## 2017-05-11 MED ORDER — ATENOLOL 100 MG PO TABS: 100.0000 mg | ORAL_TABLET | Freq: Every day | ORAL | 1 refills | Status: DC

## 2017-05-11 NOTE — Patient Instructions (Signed)
    When it comes to diets, agreement about the perfect plan isn't easy to find, even among the experts. Experts at the [employer] developed an idea known as the Healthy Eating Plate. Just imagine a plate divided into logical, healthy portions.  The emphasis is on diet quality:  Load up on vegetables and fruits - one-half of your plate: Aim for color and variety, and remember that potatoes don't count.  Go for whole grains - one-quarter of your plate: Whole wheat, barley, wheat berries, quinoa, oats, brown rice, and foods made with them. If you want pasta, go with whole wheat pasta.  Protein power - one-quarter of your plate: Fish, chicken, beans, and nuts are all healthy, versatile protein sources. Limit red meat.  The diet, however, does go beyond the plate, offering a few other suggestions.  Use healthy plant oils, such as olive, canola, soy, corn, sunflower and peanut. Check the labels, and avoid partially hydrogenated oil, which have unhealthy trans fats.  If you're thirsty, drink water. Coffee and tea are good in moderation, but skip sugary drinks and limit milk and dairy products to one or two daily servings.  The type of carbohydrate in the diet is more important than the amount. Some sources of carbohydrates, such as vegetables, fruits, whole grains, and beans--are healthier than others.  Finally, stay active.  

## 2017-05-18 ENCOUNTER — Other Ambulatory Visit: Payer: Self-pay

## 2017-05-18 ENCOUNTER — Ambulatory Visit (HOSPITAL_COMMUNITY): Payer: Medicare Other | Attending: Cardiovascular Disease

## 2017-05-18 DIAGNOSIS — R0609 Other forms of dyspnea: Secondary | ICD-10-CM | POA: Insufficient documentation

## 2017-05-18 DIAGNOSIS — E785 Hyperlipidemia, unspecified: Secondary | ICD-10-CM | POA: Diagnosis not present

## 2017-05-18 DIAGNOSIS — I313 Pericardial effusion (noninflammatory): Secondary | ICD-10-CM | POA: Insufficient documentation

## 2017-05-18 DIAGNOSIS — R0600 Dyspnea, unspecified: Secondary | ICD-10-CM | POA: Insufficient documentation

## 2017-05-18 DIAGNOSIS — R600 Localized edema: Secondary | ICD-10-CM | POA: Diagnosis not present

## 2017-05-21 ENCOUNTER — Other Ambulatory Visit: Payer: Self-pay

## 2017-05-21 ENCOUNTER — Telehealth: Payer: Self-pay

## 2017-05-21 NOTE — Telephone Encounter (Signed)
Patient wanted to discuss her medications and update her list. No longer taking bisoprolol-hydrochlorothiazide and minoxidil. These medications were removed from her list.

## 2017-05-29 ENCOUNTER — Encounter: Payer: Self-pay | Admitting: Adult Health

## 2017-05-29 ENCOUNTER — Ambulatory Visit: Payer: Medicare Other | Admitting: Adult Health

## 2017-05-29 VITALS — BP 170/80 | HR 62 | Temp 97.3°F | Ht 65.75 in | Wt 215.0 lb

## 2017-05-29 DIAGNOSIS — I10 Essential (primary) hypertension: Secondary | ICD-10-CM | POA: Diagnosis not present

## 2017-05-29 MED ORDER — CLONIDINE HCL 0.1 MG PO TABS: 0.1000 mg | ORAL_TABLET | Freq: Three times a day (TID) | ORAL | 1 refills | Status: DC

## 2017-05-29 NOTE — Progress Notes (Signed)
Assessment and Plan:  Rosalita ChessmanSuzanne was seen today for blood pressure check.  Diagnoses and all orders for this visit:  Essential hypertension Maxed out on 3 agents - Discussed adding low dose calcium channel blocker - amlodipine 2.5 mg vs changing/adding diuretic, patient is seeing cardiology in 2 days to discuss ECHO and other new concerns, after discussion agreeable to short term management with clonidine and discussing cardiology recommendations in 2 days. Will avoid increasing diuretic pending cardiology recommendations for septal hypertrophy and strong patient preference.  -     cloNIDine (CATAPRES) 0.1 MG tablet; Take 1 tablet (0.1 mg total) by mouth 3 (three) times daily.  Go to the ER if any chest pain, shortness of breath, nausea, dizziness, severe HA, changes vision/speech  Further disposition pending results of labs. Discussed med's effects and SE's.   Over 15 minutes of exam, counseling, chart review, and critical decision making was performed.   Future Appointments  Date Time Provider Department Center  05/31/2017  8:30 AM Jodelle GrossLawrence, Kathryn M, [facility]  08/16/2017 11:30 AM Lucky CowboyMcKeown, William, MD [facility] None  02/06/2018 11:00 AM Lucky CowboyMcKeown, William, MD [facility] None    ------------------------------------------------------------------------------------------------------------------   HPI BP (!) 170/80   Pulse 62   Temp (!) 97.3 F (36.3 C)   Ht 5' 5.75" (1.67 m)   Wt 215 lb (97.5 kg)   SpO2 98%   BMI 34.97 kg/m   72 y.o.female presents for BP evaluation; she is currently prescribed atenolol 100 mg daily, losartan 100 mg daily and hctz 25 mg daily after intolerance/SE with multiple other agents, most recently minoxidil 10 mg - had severe new edema and facial hair growth. She reports her BP has been gradually increasing to about 180/90s. Previously well controlled - 120/60s. She denies headache, vision changes, dizziness, but states "I can tell when my BP is up."    She has appointment with cardiology in 2 days for new dyspnea on exertion with septal hypertrophy and grade 2 diastolic dysfunction noted on ECHO obtained by our office on 05/18/2017 -   Past Medical History:  Diagnosis Date  . Hepatitis C   . HTN (hypertension)   . Hyperlipidemia   . Obesity   . Pre-diabetes   . Scoliosis   . Tobacco use    stopped 6 years ago.  . Vitamin D deficiency      No Known Allergies  Current Outpatient Medications on File Prior to Visit  Medication Sig  . ALPRAZolam (XANAX) 0.5 MG tablet TAKE ONE-HALF TO ONE TABLET BY MOUTH UP TO THREE TIMES A DAY AS NEEDED FOR ANXIETY OR SLEEP  . atenolol (TENORMIN) 100 MG tablet Take 1 tablet (100 mg total) by mouth daily.  Marland Kitchen. atorvastatin (LIPITOR) 80 MG tablet TAKE 1/2 TO 1 (ONE-HALF TO ONE) TABLET BY MOUTH DAILY FOR CHOLESTEROL OR AS DIRECTED  . ezetimibe (ZETIA) 10 MG tablet TAKE ONE TABLET BY MOUTH DAILY FOR CHOLESTEROL  . hydrochlorothiazide (HYDRODIURIL) 25 MG tablet Take 1 tablet daily for BP and fluid  . losartan (COZAAR) 100 MG tablet TAKE ONE TABLET BY MOUTH DAILY (Patient taking differently: TAKE ONE TABLET BY MOUTH AT NIGHT)  . metroNIDAZOLE (METROCREAM) 0.75 % cream   . nystatin (NYSTATIN) powder Apply daily after a shower as needed  . buPROPion (WELLBUTRIN XL) 150 MG 24 hr tablet Take 1 tablet every morning for Mood (Patient not taking: Reported on 05/11/2017)   No current facility-administered medications on file prior to visit.     ROS: all negative  except above.   Physical Exam:  BP (!) 170/80   Pulse 62   Temp (!) 97.3 F (36.3 C)   Ht 5' 5.75" (1.67 m)   Wt 215 lb (97.5 kg)   SpO2 98%   BMI 34.97 kg/m   General Appearance: Well nourished, in no apparent distress. Eyes: PERRLA, EOMs, conjunctiva no swelling or erythema Neck: Supple, thyroid normal.  Respiratory: Respiratory effort normal, BS equal bilaterally without rales, rhonchi, wheezing or stridor.  Cardio: RRR with no MRGs. Brisk  peripheral pulses without edema.  Abdomen: Soft, + BS.   Musculoskeletal: Symmetrical strength, normal gait.  Skin: Warm, dry without rashes, lesions, ecchymosis.  Neuro: Cranial nerves intact. Normal muscle tone, no cerebellar symptoms. Sensation intact.  Psych: Awake and oriented X 3, normal affect, Insight and Judgment appropriate.    Dan Maker, NP 4:16 PM [facility]

## 2017-05-29 NOTE — Patient Instructions (Signed)
Clonidine tablets What is this medicine? CLONIDINE (KLOE ni deen) is used to treat high blood pressure. This medicine may be used for other purposes; ask your health care provider or pharmacist if you have questions. COMMON BRAND NAME(S): Catapres What should I tell my health care provider before I take this medicine? They need to know if you have any of these conditions: -kidney disease -an unusual or allergic reaction to clonidine, other medicines, foods, dyes, or preservatives -pregnant or trying to get pregnant -breast-feeding How should I use this medicine? Take this medicine by mouth with a glass of water. Follow the directions on the prescription label. Take your doses at regular intervals. Do not take your medicine more often than directed. Do not suddenly stop taking this medicine. You must gradually reduce the dose or you may get a dangerous increase in blood pressure. Ask your doctor or health care professional for advice. Talk to your pediatrician regarding the use of this medicine in children. Special care may be needed. Overdosage: If you think you have taken too much of this medicine contact a poison control center or emergency room at once. NOTE: This medicine is only for you. Do not share this medicine with others. What if I miss a dose? If you miss a dose, take it as soon as you can. If it is almost time for your next dose, take only that dose. Do not take double or extra doses. What may interact with this medicine? Do not take this medicine with any of the following medications: -MAOIs like Carbex, Eldepryl, Marplan, Nardil, and Parnate This medicine may also interact with the following medications: -barbiturate medicines for inducing sleep or treating seizures like phenobarbital -certain medicines for blood pressure, heart disease, irregular heart beat -certain medicines for depression, anxiety, or psychotic disturbances -prescription pain medicines This list may not  describe all possible interactions. Give your health care provider a list of all the medicines, herbs, non-prescription drugs, or dietary supplements you use. Also tell them if you smoke, drink alcohol, or use illegal drugs. Some items may interact with your medicine. What should I watch for while using this medicine? Visit your doctor or health care professional for regular checks on your progress. Check your heart rate and blood pressure regularly while you are taking this medicine. Ask your doctor or health care professional what your heart rate should be and when you should contact him or her. You may get drowsy or dizzy. Do not drive, use machinery, or do anything that needs mental alertness until you know how this medicine affects you. To avoid dizzy or fainting spells, do not stand or sit up quickly, especially if you are an older person. Alcohol can make you more drowsy and dizzy. Avoid alcoholic drinks. Your mouth may get dry. Chewing sugarless gum or sucking hard candy, and drinking plenty of water will help. Do not treat yourself for coughs, colds, or pain while you are taking this medicine without asking your doctor or health care professional for advice. Some ingredients may increase your blood pressure. If you are going to have surgery tell your doctor or health care professional that you are taking this medicine. What side effects may I notice from receiving this medicine? Side effects that you should report to your doctor or health care professional as soon as possible: -allergic reactions like skin rash, itching or hives, swelling of the face, lips, or tongue -anxiety, nervousness -chest pain -depression -fast, irregular heartbeat -swelling of feet or legs -unusually   weak or tired Side effects that usually do not require medical attention (report to your doctor or health care professional if they continue or are bothersome): -change in sex drive or  performance -constipation -headache This list may not describe all possible side effects. Call your doctor for medical advice about side effects. You may report side effects to FDA at 1-800-FDA-1088. Where should I keep my medicine? Keep out of the reach of children. Store at room temperature between 15 and 30 degrees C (59 and 86 degrees F). Protect from light. Keep container tightly closed. Throw away any unused medicine after the expiration date. NOTE: This sheet is a summary. It may not cover all possible information. If you have questions about this medicine, talk to your doctor, pharmacist, or health care provider.  2018 Elsevier/Gold Standard (2010-08-17 13:01:28)  

## 2017-05-29 NOTE — Progress Notes (Signed)
Cardiology Office Note   Date:  05/31/2017   ID:  Deanna HertzSuzanne B Gomez, DOB Jul 29, 1945, MRN [id number]  PCP:  Lucky CowboyMcKeown, William, MD  Cardiologist: Dr. Clifton JamesMcAlhany (not seen since 2013) No chief complaint on file.    History of Present Illness: Deanna Gomez is a 72 y.o. female who presents for ongoing assessment and management of hypertension, hyperlipidemia, dyspnea on exertion was last seen by Dr. Clifton JamesMcalhany on 10/16/2011.  Patient was found to have normal LV systolic function.  The patient was seen by Judd GaudierAshley Corbett, NP, on 05/11/2017, of dyspnea on exertion, with lower extremity edema.  The patient had echocardiogram ordered and referred to cardiology for further recommendations.  Echo revealed:05/18/2017 Left ventricle: The cavity size was normal. There was severe   septal hypertrophy with otherwise moderate concentric   hypertrophy. Systolic function was normal. The estimated ejection   fraction was in the range of 60% to 65%. Wall motion was normal;   there were no regional wall motion abnormalities. Features are   consistent with a pseudonormal left ventricular filling pattern,   with concomitant abnormal relaxation and increased filling   pressure (grade 2 diastolic dysfunction). Doppler parameters are   consistent with high ventricular filling pressure. - Aortic valve: Transvalvular velocity was within the normal range.   There was no stenosis. There was mild to moderate regurgitation.   Regurgitation pressure half-time: 472 ms. - Aorta: Ascending aortic diameter: 40 mm (S). - Ascending aorta: The ascending aorta was mildly dilated. - Mitral valve: Transvalvular velocity was within the normal range.   There was no evidence for stenosis. There was trivial   regurgitation. - Left atrium: The atrium was moderately dilated. - Right ventricle: The cavity size was normal. Wall thickness was   normal. Systolic function was normal. - Tricuspid valve: There was trivial  regurgitation. - Pulmonary arteries: Systolic pressure was mildly increased. PA   peak pressure: 44 mm Hg (S). - Pericardium, extracardiac: A trivial pericardial effusion was   identified.  She is here today with complaints of elevated blood pressures, lower extremity edema, and generalized fatigue.  She denies medical noncompliance.  She was seen by her PCP and placed on clonidine 0.1 mg 3 times daily but she read about a lot of the side effects and decided not to take it.  She denies headache blurred vision dizziness or chest pain.  Past Medical History:  Diagnosis Date  . Hepatitis C   . HTN (hypertension)   . Hyperlipidemia   . Obesity   . Pre-diabetes   . Scoliosis   . Tobacco use    stopped 6 years ago.  . Vitamin D deficiency     Past Surgical History:  Procedure Laterality Date  . BACK SURGERY  1986   x 3  . BREAST RECONSTRUCTION     x 2  . CATARACT EXTRACTION     Bilateral     Current Outpatient Medications  Medication Sig Dispense Refill  . ALPRAZolam (XANAX) 0.5 MG tablet TAKE ONE-HALF TO ONE TABLET BY MOUTH UP TO THREE TIMES A DAY AS NEEDED FOR ANXIETY OR SLEEP 90 tablet 0  . atenolol (TENORMIN) 100 MG tablet Take 1 tablet (100 mg total) by mouth daily. 90 tablet 1  . atorvastatin (LIPITOR) 80 MG tablet TAKE 1/2 TO 1 (ONE-HALF TO ONE) TABLET BY MOUTH DAILY FOR CHOLESTEROL OR AS DIRECTED 90 tablet 1  . ezetimibe (ZETIA) 10 MG tablet TAKE ONE TABLET BY MOUTH DAILY FOR CHOLESTEROL 90 tablet 1  .  losartan (COZAAR) 100 MG tablet TAKE ONE TABLET BY MOUTH DAILY (Patient taking differently: TAKE ONE TABLET BY MOUTH AT NIGHT) 90 tablet 0  . metroNIDAZOLE (METROCREAM) 0.75 % cream     . nystatin (NYSTATIN) powder Apply daily after a shower as needed 60 g 2  . chlorthalidone (HYGROTON) 25 MG tablet Take 1 tablet (25 mg total) by mouth daily. 90 tablet 3   No current facility-administered medications for this visit.     Allergies:   Patient has no known allergies.     Social History:  The patient  reports that she quit smoking about 12 years ago. Her smoking use included cigarettes. She has a 30.00 pack-year smoking history. She has never used smokeless tobacco. She reports that she drinks about 7.0 oz of alcohol per week. She reports that she does not use drugs.   Family History:  The patient's family history includes Coronary artery disease in her brother; Hypertension in her mother.    ROS: All other systems are reviewed and negative. Unless otherwise mentioned in H&P    PHYSICAL EXAM: VS:  BP (!) 150/78   Pulse 61   Ht 5' 5.75" (1.67 m)   Wt 213 lb 9.6 oz (96.9 kg)   BMI 34.74 kg/m  , BMI Body mass index is 34.74 kg/m. GEN: Well nourished, well developed, in no acute distress  HEENT: normal  Neck: no JVD, carotid bruits, or masses Cardiac: RRR; no murmurs, rubs, or gallops,no edema  Respiratory:  Clear to auscultation bilaterally, normal work of breathing GI: soft, nontender, nondistended, + BS MS: no deformity or atrophy  Skin: warm and dry, no rash Neuro:  Strength and sensation are intact Psych: euthymic mood, full affect   EKG:  SR with 1st degree AV block. Rate 63, Incomplete RBBB.   Recent Labs: 01/31/2017: Magnesium 1.5 05/11/2017: ALT 16; BUN 14; Creat 0.84; Hemoglobin 13.3; Platelets 285; Potassium 4.5; Sodium 135; TSH 2.24    Lipid Panel    Component Value Date/Time   CHOL 187 05/11/2017 1156   TRIG 101 05/11/2017 1156   HDL 79 05/11/2017 1156   CHOLHDL 2.4 05/11/2017 1156   VLDL 23 07/06/2016 1200   LDLCALC 89 05/11/2017 1156      Wt Readings from Last 3 Encounters:  05/31/17 213 lb 9.6 oz (96.9 kg)  05/29/17 215 lb (97.5 kg)  05/11/17 224 lb (101.6 kg)      Other studies Reviewed: Echo as above  ASSESSMENT AND PLAN:  1.  Hypertension: Blood pressure has been elevated at home, slightly elevated here today.  She states that her blood pressures been running 140s and 150s systolic at home.  This was why  her PCP added clonidine.  She states that her blood pressures been running is higher in the past 190s systolic.  I am going to discontinue HCTZ and change to chlorthalidone 25 mg daily.  May consider adding amlodipine.  She is to take her blood pressures at home and record them twice a day at the same time every day.  She is given a low-sodium diet to review in written form.  She does admit to eating salted foods at times.  She will come back and see me in 1 week to evaluate her response to medication.  2.  Chronic diastolic dysfunction: Noted on echo above.  Grade II.  I have explained this to her and reinforced the need for better blood pressure control and salt avoidance.  May consider addition of Lasix if  chlorthalidone does not assist or adding spironolactone first.  I will repeat her BMET prior to next appointment.  3.  Hypercholesterolemia: The patient is on Zetia 10 mg daily.  She is to continue low-cholesterol diet.  Most recent lipid study completed on 05/11/2017 revealed cholesterol of 187, HDL 79, LDL of 89, triglycerides 101.  May need to add low-dose statin with a goal LDL of less than 70.  Current medicines are reviewed at length with the patient today.  Will need to reestablish with new cardiologist on next office visit.  Unless she chooses to follow-up with Dr. Clifton James.  Labs/ tests ordered today include: BMET  Bettey Mare. Liborio Nixon, ANP, AACC   05/31/2017 1:22 PM    [facility] [address], [place], [place] [address] Phone: [phone]; Fax: [phone]

## 2017-05-31 ENCOUNTER — Encounter: Payer: Self-pay | Admitting: Adult Health

## 2017-05-31 ENCOUNTER — Ambulatory Visit: Payer: Medicare Other | Admitting: Adult Health

## 2017-05-31 VITALS — BP 150/78 | HR 61 | Ht 65.75 in | Wt 213.6 lb

## 2017-05-31 DIAGNOSIS — I5032 Chronic diastolic (congestive) heart failure: Secondary | ICD-10-CM

## 2017-05-31 DIAGNOSIS — I10 Essential (primary) hypertension: Secondary | ICD-10-CM

## 2017-05-31 DIAGNOSIS — E7800 Pure hypercholesterolemia, unspecified: Secondary | ICD-10-CM

## 2017-05-31 DIAGNOSIS — Z79899 Other long term (current) drug therapy: Secondary | ICD-10-CM | POA: Diagnosis not present

## 2017-05-31 MED ORDER — CHLORTHALIDONE 25 MG PO TABS: 25.0000 mg | ORAL_TABLET | Freq: Every day | ORAL | 3 refills | Status: DC

## 2017-05-31 NOTE — Patient Instructions (Signed)
Medication Instructions:  START CHLORTHALIDONE 25MG  DAILY  If you need a refill on your cardiac medications before your next appointment, please call your pharmacy.  Labwork: BMET THE DAY BEFORE FOLLOW UP APPT IN 1 WEEK HERE IN OUR OFFICE AT [facility]  Take the provided lab slips for you to take with you to the lab for you blood draw.   You will NOT need to fast   Special Instructions: TAKE AND LOG YOU BP DAILY  FOLLOW ATTACHED LOW SODIUM DIET  Follow-Up: Your physician wants you to follow-up in: 1 WEEK WITH KATHRYN LAWRENCE (NURSE PRACTIONIER), DNP.   Thank you for choosing [facility] at [facility]!!     Low-Sodium Eating Plan Sodium, which is an element that makes up salt, helps you maintain a healthy balance of fluids in your body. Too much sodium can increase your blood pressure and cause fluid and waste to be held in your body. Your health care provider or dietitian may recommend following this plan if you have high blood pressure (hypertension), kidney disease, liver disease, or heart failure. Eating less sodium can help lower your blood pressure, reduce swelling, and protect your heart, liver, and kidneys. What are tips for following this plan? General guidelines  Most people on this plan should limit their sodium intake to 1,500-2,000 mg (milligrams) of sodium each day. Reading food labels  The Nutrition Facts label lists the amount of sodium in one serving of the food. If you eat more than one serving, you must multiply the listed amount of sodium by the number of servings.  Choose foods with less than 140 mg of sodium per serving.  Avoid foods with 300 mg of sodium or more per serving. Shopping  Look for lower-sodium products, often labeled as "low-sodium" or "no salt added."  Always check the sodium content even if foods are labeled as "unsalted" or "no salt added".  Buy fresh foods. ? Avoid canned foods and premade or frozen meals. ? Avoid canned, cured,  or processed meats  Buy breads that have less than 80 mg of sodium per slice. Cooking  Eat more home-cooked food and less restaurant, buffet, and fast food.  Avoid adding salt when cooking. Use salt-free seasonings or herbs instead of table salt or sea salt. Check with your health care provider or pharmacist before using salt substitutes.  Cook with plant-based oils, such as canola, sunflower, or olive oil. Meal planning  When eating at a restaurant, ask that your food be prepared with less salt or no salt, if possible.  Avoid foods that contain MSG (monosodium glutamate). MSG is sometimes added to Cong[place] food, bouillon, and some canned foods. What foods are recommended? The items listed may not be a complete list. Talk with your dietitian about what dietary choices are best for you. Grains Low-sodium cereals, including oats, puffed wheat and rice, and shredded wheat. Low-sodium crackers. Unsalted rice. Unsalted pasta. Low-sodium bread. Whole-grain breads and whole-grain pasta. Vegetables Fresh or frozen vegetables. "No salt added" canned vegetables. "No salt added" tomato sauce and paste. Low-sodium or reduced-sodium tomato and vegetable juice. Fruits Fresh, frozen, or canned fruit. Fruit juice. Meats and other protein foods Fresh or frozen (no salt added) meat, poultry, seafood, and fish. Low-sodium canned tuna and salmon. Unsalted nuts. Dried peas, beans, and lentils without added salt. Unsalted canned beans. Eggs. Unsalted nut butters. Dairy Milk. Soy milk. Cheese that is naturally low in sodium, such as ricotta cheese, fresh mozzarella, or Swiss cheese Low-sodium or reduced-sodium cheese. Cream  cheese. Yogurt. Fats and oils Unsalted butter. Unsalted margarine with no trans fat. Vegetable oils such as canola or olive oils. Seasonings and other foods Fresh and dried herbs and spices. Salt-free seasonings. Low-sodium mustard and ketchup. Sodium-free salad dressing. Sodium-free light  mayonnaise. Fresh or refrigerated horseradish. Lemon juice. Vinegar. Homemade, reduced-sodium, or low-sodium soups. Unsalted popcorn and pretzels. Low-salt or salt-free chips. What foods are not recommended? The items listed may not be a complete list. Talk with your dietitian about what dietary choices are best for you. Grains Instant hot cereals. Bread stuffing, pancake, and biscuit mixes. Croutons. Seasoned rice or pasta mixes. Noodle soup cups. Boxed or frozen macaroni and cheese. Regular salted crackers. Self-rising flour. Vegetables Sauerkraut, pickled vegetables, and relishes. Olives. [place] fries. Onion rings. Regular canned vegetables (not low-sodium or reduced-sodium). Regular canned tomato sauce and paste (not low-sodium or reduced-sodium). Regular tomato and vegetable juice (not low-sodium or reduced-sodium). Frozen vegetables in sauces. Meats and other protein foods Meat or fish that is salted, canned, smoked, spiced, or pickled. Bacon, ham, sausage, hotdogs, corned beef, chipped beef, packaged lunch meats, salt pork, jerky, pickled herring, anchovies, regular canned tuna, sardines, salted nuts. Dairy Processed cheese and cheese spreads. Cheese curds. Blue cheese. Feta cheese. String cheese. Regular cottage cheese. Buttermilk. Canned milk. Fats and oils Salted butter. Regular margarine. Ghee. Bacon fat. Seasonings and other foods Onion salt, garlic salt, seasoned salt, table salt, and sea salt. Canned and packaged gravies. Worcestershire sauce. Tartar sauce. Barbecue sauce. Teriyaki sauce. Soy sauce, including reduced-sodium. Steak sauce. Fish sauce. Oyster sauce. Cocktail sauce. Horseradish that you find on the shelf. Regular ketchup and mustard. Meat flavorings and tenderizers. Bouillon cubes. Hot sauce and Tabasco sauce. Premade or packaged marinades. Premade or packaged taco seasonings. Relishes. Regular salad dressings. Salsa. Potato and tortilla chips. Corn chips and puffs. Salted  popcorn and pretzels. Canned or dried soups. Pizza. Frozen entrees and pot pies. Summary  Eating less sodium can help lower your blood pressure, reduce swelling, and protect your heart, liver, and kidneys.  Most people on this plan should limit their sodium intake to 1,500-2,000 mg (milligrams) of sodium each day.  Canned, boxed, and frozen foods are high in sodium. Restaurant foods, fast foods, and pizza are also very high in sodium. You also get sodium by adding salt to food.  Try to cook at home, eat more fresh fruits and vegetables, and eat less fast food, canned, processed, or prepared foods. This information is not intended to replace advice given to you by your health care provider. Make sure you discuss any questions you have with your health care provider. Document Released: 08/12/2001 Document Revised: 02/14/2016 Document Reviewed: 02/14/2016 Elsevier Interactive Patient Education  2018 [employer].  Cooking With Less [employer] with less salt is one way to reduce the amount of sodium you get from food. Depending on your condition and overall health, your health care provider or diet and nutrition specialist (dietitian) may recommend that you reduce your sodium intake. Most people should have less than 2,300 milligrams (mg) of sodium each day. If you have high blood pressure (hypertension), you may need to limit your sodium to 1,500 mg each day. Follow the tips below to help reduce your sodium intake. What do I need to know about cooking with less salt? Shopping  Buy sodium-free or low-sodium products. Look for the following words on food labels: ? Low-sodium. ? Sodium-free. ? Reduced-sodium. ? No salt added. ? Unsalted.  Buy fresh or frozen vegetables. Avoid  canned vegetables.  Avoid buying meats or protein foods that have been injected with broth or saline solution.  Avoid cured or smoked meats, such as hot dogs, bacon, salami, ham, and bologna. Reading food  labels  Check the food label before buying or using packaged ingredients.  Look for products with no more than 140 mg of sodium in one serving.  Do not choose foods with salt as one of the first three ingredients on the ingredients list. If salt is one of the first three ingredients, it usually means the item is high in sodium, because ingredients are listed in order of amount in the food item. Cooking  Use herbs, seasonings without salt, and spices as substitutes for salt in foods.  Use sodium-free baking soda when baking.  Grill, braise, or roast foods to add flavor with less salt.  Avoid adding salt to pasta, rice, or hot cereals while cooking.  Drain and rinse canned vegetables before use.  Avoid adding salt when cooking sweets and desserts.  Cook with low-sodium ingredients. What are some salt alternatives? The following are herbs, seasonings, and spices that can be used instead of salt to give taste to your food. Herbs should be fresh or dried. Do not choose packaged mixes. Next to the name of the herb, spice, or seasoning are some examples of foods you can pair it with. Herbs  Bay leaves - Soups, meat and vegetable dishes, and spaghetti sauce.  Basil - [employer], soups, pasta, and fish dishes.  Cilantro - Meat, poultry, and vegetable dishes.  Chili powder - Marinades and Mexican dishes.  Chives - Salad dressings and potato dishes.  Cumin - Mexican dishes, couscous, and meat dishes.  Dill - Fish dishes, sauces, and salads.  Fennel - Meat and vegetable dishes, breads, and cookies.  Garlic (do not use garlic salt) - [place] dishes, meat dishes, salad dressings, and sauces.  Marjoram - Soups, potato dishes, and meat dishes.  Oregano - Pizza and spaghetti sauce.  Parsley - Salads, soups, pasta, and meat dishes.  Rosemary - [place] dishes, salad dressings, soups, and red meats.  Saffron - Fish dishes, pasta, and some poultry dishes.  Sage - Stuffings and  sauces.  Tarragon - Fish and [employer].  Thyme - Stuffing, meat, and fish dishes. Seasonings  Lemon juice - Fish dishes, poultry dishes, vegetables, and salads.  Vinegar - Salad dressings, vegetables, and fish dishes. Spices  Cinnamon - Sweet dishes, such as cakes, cookies, and puddings.  Cloves - Gingerbread, puddings, and marinades for meats.  Curry - Vegetable dishes, fish and poultry dishes, and stir-fry dishes.  Ginger - Vegetables dishes, fish dishes, and stir-fry dishes.  Nutmeg - Pasta, vegetables, poultry, fish dishes, and custard. What are some low-sodium ingredients and foods?  Fresh or frozen fruits and vegetables with no sauce added.  Fresh or frozen whole meats, poultry, and fish with no sauce added.  Eggs.  Noodles, pasta, quinoa, rice.  Shredded or puffed wheat or puffed rice.  Regular or quick oats.  Milk, yogurt, hard cheeses, and low-sodium cheeses. Good cheese choices include Swiss, [employer], and [address]. Always check the label for the serving size and sodium content.  Unsalted butter or margarine.  Unsalted nuts.  Sherbet or ice cream (keep to  cup per serving).  Homemade pudding.  Sodium-free baking soda and baking powder. This is not a complete list of low-sodium ingredients and foods. Contact your dietitian for more options. Summary  Cooking with less salt is one  way to reduce the amount of sodium that you get from food.  Buy sodium-free or low-sodium products.  Check the food label before using or buying packaged ingredients.  Use herbs, seasonings without salt, and spices as substitutes for salt in foods. This information is not intended to replace advice given to you by your health care provider. Make sure you discuss any questions you have with your health care provider. Document Released: 02/20/2005 Document Revised: 02/29/2016 Document Reviewed: 02/29/2016 Elsevier Interactive Patient Education  2017 [employer].

## 2017-06-07 ENCOUNTER — Ambulatory Visit: Payer: Medicare Other | Admitting: Adult Health

## 2017-06-07 ENCOUNTER — Encounter: Payer: Self-pay | Admitting: Adult Health

## 2017-06-07 VITALS — BP 140/82 | HR 60 | Ht 65.75 in | Wt 213.0 lb

## 2017-06-07 DIAGNOSIS — I5032 Chronic diastolic (congestive) heart failure: Secondary | ICD-10-CM | POA: Diagnosis not present

## 2017-06-07 DIAGNOSIS — I10 Essential (primary) hypertension: Secondary | ICD-10-CM

## 2017-06-07 DIAGNOSIS — E7800 Pure hypercholesterolemia, unspecified: Secondary | ICD-10-CM

## 2017-06-07 LAB — BASIC METABOLIC PANEL
BUN / CREAT RATIO: 16 (ref 12–28)
BUN: 13 mg/dL (ref 8–27)
CALCIUM: 10.3 mg/dL (ref 8.7–10.3)
CHLORIDE: 86 mmol/L — AB (ref 96–106)
CO2: 27 mmol/L (ref 20–29)
Creatinine, Ser: 0.83 mg/dL (ref 0.57–1.00)
GFR calc non Af Amer: 70 mL/min/{1.73_m2} (ref >59)
GFR, EST AFRICAN AMERICAN: 80 mL/min/{1.73_m2} (ref >59)
GLUCOSE: 100 mg/dL — AB (ref 65–99)
POTASSIUM: 4.7 mmol/L (ref 3.5–5.2)
Sodium: 131 mmol/L — ABNORMAL LOW (ref 134–144)

## 2017-06-07 MED ORDER — CHLORTHALIDONE 25 MG PO TABS: 25.0000 mg | ORAL_TABLET | Freq: Every day | ORAL | 3 refills | Status: DC

## 2017-06-07 MED ORDER — LOSARTAN POTASSIUM 100 MG PO TABS: 100.0000 mg | ORAL_TABLET | Freq: Every day | ORAL | 3 refills | Status: DC

## 2017-06-07 NOTE — Patient Instructions (Signed)
Medication Instructions:  NO CHANGES- Your physician recommends that you continue on your current medications as directed. Please refer to the Current Medication list given to you today.  If you need a refill on your cardiac medications before your next appointment, please call your pharmacy.  Follow-Up: Your physician wants you to follow-up in: 6 MONTHS WITH DR Clifton JamesMCALHANY -OR- KATHRYN LAWRENCE (NURSE PRACTIONIER), DNP.  You should receive a reminder letter in the mail two months in advance. If you do not receive a letter, please call our office [phone] to schedule the 12-2017 follow-up appointment.   Thank you for choosing [facility] at [facility]!!

## 2017-06-07 NOTE — Progress Notes (Signed)
Cardiology Office Note   Date:  06/07/2017   ID:  Deanna CowmanSuzanne B Denny, DOB 1945/07/29, MRN [id number]  PCP:  Lucky CowboyMcKeown, William, MD  Cardiologist: [facility]  Chief Complaint  Patient presents with  . Follow-up    discuss taking losartan at night     History of Present Illness: Deanna Gomez is a 72 y.o. female who presents for for ongoing assessment and management of hypertension, hyperlipidemia, dyspnea on exertion and chronic lower extremity edema Patient was found to have normal LV systolic function, grade 2 diastolic dysfunction echocardiogram dated 05/18/2017.  The patient did not take clonidine as directed because she was afraid of side effects.    When I saw her last on 05/31/2017 blood pressure was elevated.  On that office visit I discontinued HCTZ and change her to chlorthalidone 25 mg daily.  Consideration for adding amlodipine on this office visit.  She was to take her blood pressures twice daily record them and bring them with her on this office visit.  She was also given written copy of low-sodium diet.  She is here today on follow-up for evaluation of her response to chlorthalidone.  Blood pressure is much better at home she is brought with her a copy.  Slightly elevated today but she does have whitecoat syndrome.  Despite this her blood pressure is about 10 points lower on her systolic level.  Much better than originally seen on 05/29/2017 with a blood pressure of 170/80.  Now BP 140/82.  Home blood pressure measurements range for BP from 124-136 systolic over 70s and 80s diastolic.  Past Medical History:  Diagnosis Date  . Hepatitis C   . HTN (hypertension)   . Hyperlipidemia   . Obesity   . Pre-diabetes   . Scoliosis   . Tobacco use    stopped 6 years ago.  . Vitamin D deficiency     Past Surgical History:  Procedure Laterality Date  . BACK SURGERY  1986   x 3  . BREAST RECONSTRUCTION     x 2  . CATARACT EXTRACTION     Bilateral     Current Outpatient  Medications  Medication Sig Dispense Refill  . ALPRAZolam (XANAX) 0.5 MG tablet TAKE ONE-HALF TO ONE TABLET BY MOUTH UP TO THREE TIMES A DAY AS NEEDED FOR ANXIETY OR SLEEP 90 tablet 0  . atenolol (TENORMIN) 100 MG tablet Take 1 tablet (100 mg total) by mouth daily. 90 tablet 1  . atorvastatin (LIPITOR) 80 MG tablet TAKE 1/2 TO 1 (ONE-HALF TO ONE) TABLET BY MOUTH DAILY FOR CHOLESTEROL OR AS DIRECTED 90 tablet 1  . chlorthalidone (HYGROTON) 25 MG tablet Take 1 tablet (25 mg total) by mouth daily. 90 tablet 3  . ezetimibe (ZETIA) 10 MG tablet TAKE ONE TABLET BY MOUTH DAILY FOR CHOLESTEROL 90 tablet 1  . losartan (COZAAR) 100 MG tablet Take 1 tablet (100 mg total) by mouth daily. 90 tablet 3  . metroNIDAZOLE (METROCREAM) 0.75 % cream     . nystatin (NYSTATIN) powder Apply daily after a shower as needed 60 g 2   No current facility-administered medications for this visit.     Allergies:   Patient has no known allergies.    Social History:  The patient  reports that she quit smoking about 12 years ago. Her smoking use included cigarettes. She has a 30.00 pack-year smoking history. She has never used smokeless tobacco. She reports that she drinks about 7.0 oz of alcohol per week. She reports that  she does not use drugs.   Family History:  The patient's family history includes Coronary artery disease in her brother; Hypertension in her mother.    ROS: All other systems are reviewed and negative. Unless otherwise mentioned in H&P    PHYSICAL EXAM: VS:  BP 140/82   Pulse 60   Ht 5' 5.75" (1.67 m)   Wt 213 lb (96.6 kg)   BMI 34.64 kg/m  , BMI Body mass index is 34.64 kg/m. GEN: Well nourished, well developed, in no acute distress  HEENT: normal  Neck: no JVD, carotid bruits, or masses Cardiac: RRR; soft systolic murmur at the RSB, no rubs, or gallops,no edema  Respiratory:  clear to auscultation bilaterally, normal work of breathing GI: soft, nontender, nondistended, + BS MS: no deformity  or atrophy  Skin: warm and dry, no rash Neuro:  Strength and sensation are intact Psych: euthymic mood, full affect   EKG: Not done today.   Recent Labs: 01/31/2017: Magnesium 1.5 05/11/2017: ALT 16; Hemoglobin 13.3; Platelets 285; TSH 2.24 06/06/2017: BUN 13; Creatinine, Ser 0.83; Potassium 4.7; Sodium 131    Lipid Panel    Component Value Date/Time   CHOL 187 05/11/2017 1156   TRIG 101 05/11/2017 1156   HDL 79 05/11/2017 1156   CHOLHDL 2.4 05/11/2017 1156   VLDL 23 07/06/2016 1200   LDLCALC 89 05/11/2017 1156      Wt Readings from Last 3 Encounters:  06/07/17 213 lb (96.6 kg)  05/31/17 213 lb 9.6 oz (96.9 kg)  05/29/17 215 lb (97.5 kg)      Other studies Reviewed: Echocardiogram 05/19/2017 Left ventricle: The cavity size was normal. There was severe   septal hypertrophy with otherwise moderate concentric   hypertrophy. Systolic function was normal. The estimated ejection   fraction was in the range of 60% to 65%. Wall motion was normal;   there were no regional wall motion abnormalities. Features are   consistent with a pseudonormal left ventricular filling pattern,   with concomitant abnormal relaxation and increased filling   pressure (grade 2 diastolic dysfunction). Doppler parameters are   consistent with high ventricular filling pressure. - Aortic valve: Transvalvular velocity was within the normal range.   There was no stenosis. There was mild to moderate regurgitation.   Regurgitation pressure half-time: 472 ms. - Aorta: Ascending aortic diameter: 40 mm (S). - Ascending aorta: The ascending aorta was mildly dilated. - Mitral valve: Transvalvular velocity was within the normal range.   There was no evidence for stenosis. There was trivial   regurgitation. - Left atrium: The atrium was moderately dilated. - Right ventricle: The cavity size was normal. Wall thickness was   normal. Systolic function was normal. - Tricuspid valve: There was trivial  regurgitation. - Pulmonary arteries: Systolic pressure was mildly increased. PA   peak pressure: 44 mm Hg (S). - Pericardium, extracardiac: A trivial pericardial effusion was   identified.   ASSESSMENT AND PLAN:  1.  Hypertension: Much better controlled on medication regimen. She is tolerating chlorthalidone. She wants to go back to walking on treadmill at gym and increasing exercise. I have encouraged her to do so. She is reducing the salt in her diet. I have reviewed her labs. She is mildly hyponatremic. Will follow.   2. Chronic Diastolic CHF: Most recent echo in March of 2019 demonstrated Grade II diastolic dysfunction with mildly elevated pulmonary pressures. No evidence of volume overload on examination. She is asymptomatic.   3. Hypercholesterolemia: PCP is following labs  in June.   Current medicines are reviewed at length with the patient today.    Labs/ tests ordered today include: None   Bettey Mare. Liborio Nixon, ANP, AACC   06/07/2017 10:15 AM    [facility] [address], [place], [place] [address] Phone: [phone]; Fax: [phone]

## 2017-06-21 ENCOUNTER — Other Ambulatory Visit: Payer: Self-pay | Admitting: Adult Health

## 2017-06-21 DIAGNOSIS — G4700 Insomnia, unspecified: Secondary | ICD-10-CM

## 2017-06-25 LAB — HM MAMMOGRAPHY

## 2017-07-03 ENCOUNTER — Encounter: Payer: Self-pay | Admitting: *Deleted

## 2017-07-17 ENCOUNTER — Ambulatory Visit: Payer: Medicare Other | Admitting: Adult Health

## 2017-07-17 ENCOUNTER — Encounter: Payer: Self-pay | Admitting: Adult Health

## 2017-07-17 VITALS — BP 130/66 | HR 55 | Temp 97.3°F | Ht 65.75 in | Wt 211.0 lb

## 2017-07-17 DIAGNOSIS — K5900 Constipation, unspecified: Secondary | ICD-10-CM | POA: Diagnosis not present

## 2017-07-17 DIAGNOSIS — I10 Essential (primary) hypertension: Secondary | ICD-10-CM

## 2017-07-17 NOTE — Patient Instructions (Signed)
Try taking atenolol at night and losartan in the morning - see if this changes nausea/dizziness   Magnesium citrate - bottle of fluid - drink 1/2 bottle followed by a tall glass of water, can repeat in 4 hours if no progress   Miralax daily unless start having diarrhea  Try sennakot OTC product - start tomorrow - stimulant to get things moving    Constipation, Adult Constipation is when a person has fewer bowel movements in a week than normal, has difficulty having a bowel movement, or has stools that are dry, hard, or larger than normal. Constipation may be caused by an underlying condition. It may become worse with age if a person takes certain medicines and does not take in enough fluids. Follow these instructions at home: Eating and drinking   Eat foods that have a lot of fiber, such as fresh fruits and vegetables, whole grains, and beans.  Limit foods that are high in fat, low in fiber, or overly processed, such as french fries, hamburgers, cookies, candies, and soda.  Drink enough fluid to keep your urine clear or pale yellow. General instructions  Exercise regularly or as told by your health care provider.  Go to the restroom when you have the urge to go. Do not hold it in.  Take over-the-counter and prescription medicines only as told by your health care provider. These include any fiber supplements.  Practice pelvic floor retraining exercises, such as deep breathing while relaxing the lower abdomen and pelvic floor relaxation during bowel movements.  Watch your condition for any changes.  Keep all follow-up visits as told by your health care provider. This is important. Contact a health care provider if:  You have pain that gets worse.  You have a fever.  You do not have a bowel movement after 4 days.  You vomit.  You are not hungry.  You lose weight.  You are bleeding from the anus.  You have thin, pencil-like stools. Get help right away if:  You have a  fever and your symptoms suddenly get worse.  You leak stool or have blood in your stool.  Your abdomen is bloated.  You have severe pain in your abdomen.  You feel dizzy or you faint. This information is not intended to replace advice given to you by your health care provider. Make sure you discuss any questions you have with your health care provider. Document Released: 11/19/2003 Document Revised: 09/10/2015 Document Reviewed: 08/11/2015 Elsevier Interactive Patient Education  2018 [employer].

## 2017-07-17 NOTE — Progress Notes (Signed)
Assessment and Plan:  Deanna Gomez was seen today for nausea, constipation and weakness.  Diagnoses and all orders for this visit:  Constipation, unspecified constipation type Get magnesium citrate, drink 1/2 bottle followed by tall glass of water, repeat in 4 hours if needed Start on daily miralax, add sennakot OTC Increase fiber, reminded needs to drink plenty of fluids Call if not having a BM 3 days or longer  Essential hypertension Cannot fully evaluate as she did not present with log today Dizziness/nausea ? R/t BP medication - will have her switch to taking at night and see if symptoms change Keep regular appointment in 1 month - call sooner if any concerns.  Call if Values below 100/60 with nausea/dizziness  Further disposition pending results of labs. Discussed med's effects and SE's.   Over 30 minutes of exam, counseling, chart review, and critical decision making was performed.   Future Appointments  Date Time Provider Department Center  08/16/2017 11:30 AM Lucky Cowboy, MD [facility] None  02/06/2018 11:00 AM Lucky Cowboy, MD [facility] None    ------------------------------------------------------------------------------------------------------------------   HPI BP 130/66   Pulse (!) 55   Temp (!) 97.3 F (36.3 C)   Ht 5' 5.75" (1.67 m)   Wt 211 lb (95.7 kg)   SpO2 98%   BMI 34.32 kg/m   72 y.o.female with hx of anxiety presents for c/o nausea, mild dizziness in the AM ongoing x 2 months which began after starting new blood pressure medication atenolol - she reports she starts having symptoms 1-2 hours after taking medication, BP running 130-140s/60s. She has had several problems with BP control recently and has been referred to cardiology for management.   She also reports a generalized reduced appetite that began 1-2 weeks ago; she also noted increasing constipation, currently having BM "only a little" every day for the past 3 days without a full movement.  She has taken miralax in the past with success, but didn't start taking until yesterday.   She has hx of impaction after surgery/opiod use but is not currently on any pain medications.    Past Medical History:  Diagnosis Date  . Hepatitis C   . HTN (hypertension)   . Hyperlipidemia   . Obesity   . Pre-diabetes   . Scoliosis   . Tobacco use    stopped 6 years ago.  . Vitamin D deficiency      No Known Allergies  Current Outpatient Medications on File Prior to Visit  Medication Sig  . ALPRAZolam (XANAX) 0.5 MG tablet TAKE ONE-HALF TO ONE TABLET BY MOUTH UP TO THREE TIMES A DAY AS NEEDED FOR ANXIETY OR SLEEP  . atorvastatin (LIPITOR) 80 MG tablet TAKE 1/2 TO 1 (ONE-HALF TO ONE) TABLET BY MOUTH DAILY FOR CHOLESTEROL OR AS DIRECTED  . chlorthalidone (HYGROTON) 25 MG tablet Take 1 tablet (25 mg total) by mouth daily.  Marland Kitchen ezetimibe (ZETIA) 10 MG tablet TAKE ONE TABLET BY MOUTH DAILY FOR CHOLESTEROL  . losartan (COZAAR) 100 MG tablet Take 1 tablet (100 mg total) by mouth daily.  . metroNIDAZOLE (METROCREAM) 0.75 % cream   . atenolol (TENORMIN) 100 MG tablet Take 1 tablet (100 mg total) by mouth daily. (Patient not taking: Reported on 07/17/2017)  . nystatin (NYSTATIN) powder Apply daily after a shower as needed (Patient not taking: Reported on 07/17/2017)   No current facility-administered medications on file prior to visit.     ROS: Review of Systems  Constitutional: Negative for chills, diaphoresis, fever, malaise/fatigue and weight  loss.  HENT: Negative.   Eyes: Negative for blurred vision and double vision.  Respiratory: Negative for cough, sputum production, shortness of breath and wheezing.   Cardiovascular: Negative for chest pain, palpitations, orthopnea, claudication, leg swelling and PND.  Gastrointestinal: Positive for constipation and nausea. Negative for abdominal pain, blood in stool, diarrhea, melena and vomiting.  Genitourinary: Negative.   Musculoskeletal: Negative for  falls.  Skin: Negative for rash.  Neurological: Positive for dizziness. Negative for sensory change, loss of consciousness, weakness and headaches.   Physical Exam:  BP 130/66   Pulse (!) 55   Temp (!) 97.3 F (36.3 C)   Ht 5' 5.75" (1.67 m)   Wt 211 lb (95.7 kg)   SpO2 98%   BMI 34.32 kg/m   General Appearance: Well nourished, in no apparent distress. Eyes: PERRLA, EOMs, conjunctiva no swelling or erythema Sinuses: No Frontal/maxillary tenderness ENT/Mouth: Ext aud canals clear, TMs without erythema, bulging. No erythema, swelling, or exudate on post pharynx.  Tonsils not swollen or erythematous. Hearing normal.  Neck: Supple, thyroid normal.  Respiratory: Respiratory effort normal, BS equal bilaterally without rales, rhonchi, wheezing or stridor.  Cardio: RRR with no MRGs. Brisk peripheral pulses without edema.  Abdomen: Soft, + BS.  Non-distended, non- tender, no guarding, rebound, palpable hernias or masses. Lymphatics: Non tender without lymphadenopathy.  Musculoskeletal: Full ROM, 5/5 strength, normal gait.  Skin: Warm, dry without rashes, lesions, ecchymosis.  Neuro: Cranial nerves intact. Normal muscle tone, no cerebellar symptoms. Sensation intact.  Psych: Awake and oriented X 3, normal affect, Insight and Judgment appropriate.     Dan Maker, NP 12:31 PM [facility]

## 2017-07-19 ENCOUNTER — Telehealth: Payer: Self-pay

## 2017-07-19 NOTE — Telephone Encounter (Signed)
Patient states that since January her diastolic blood pressure has averaged around 60. Wanted you to know that the magnesium citrate has worked, she is "cleaned out:". Wants to know if she should take 1 or 2 of the Senakot? Hasn't had a bowel movement today. Please advise.

## 2017-07-20 NOTE — Telephone Encounter (Signed)
LMTCB

## 2017-07-23 NOTE — Telephone Encounter (Signed)
Patient notified

## 2017-07-23 NOTE — Telephone Encounter (Signed)
Patient would like to know if she should be taking the Senokot daily because the packaging states to consult a physician. Her symptoms of dizziness and nausea have subsided but her bowel movements are not regular. Please advise about the senokot.

## 2017-08-16 ENCOUNTER — Ambulatory Visit: Payer: Medicare Other | Admitting: Internal Medicine

## 2017-08-16 ENCOUNTER — Other Ambulatory Visit: Payer: Self-pay | Admitting: *Deleted

## 2017-08-16 ENCOUNTER — Encounter: Payer: Self-pay | Admitting: Internal Medicine

## 2017-08-16 VITALS — BP 118/72 | HR 52 | Temp 97.3°F | Resp 16 | Ht 65.75 in | Wt 210.6 lb

## 2017-08-16 DIAGNOSIS — I10 Essential (primary) hypertension: Secondary | ICD-10-CM | POA: Diagnosis not present

## 2017-08-16 DIAGNOSIS — R7309 Other abnormal glucose: Secondary | ICD-10-CM

## 2017-08-16 DIAGNOSIS — E782 Mixed hyperlipidemia: Secondary | ICD-10-CM | POA: Diagnosis not present

## 2017-08-16 DIAGNOSIS — E559 Vitamin D deficiency, unspecified: Secondary | ICD-10-CM | POA: Diagnosis not present

## 2017-08-16 DIAGNOSIS — E785 Hyperlipidemia, unspecified: Secondary | ICD-10-CM

## 2017-08-16 DIAGNOSIS — R7303 Prediabetes: Secondary | ICD-10-CM | POA: Diagnosis not present

## 2017-08-16 DIAGNOSIS — Z79899 Other long term (current) drug therapy: Secondary | ICD-10-CM

## 2017-08-16 MED ORDER — ATENOLOL 100 MG PO TABS: 100.0000 mg | ORAL_TABLET | Freq: Every day | ORAL | 1 refills | Status: DC

## 2017-08-16 MED ORDER — CHLORTHALIDONE 25 MG PO TABS: 25.0000 mg | ORAL_TABLET | Freq: Every day | ORAL | 3 refills | Status: DC

## 2017-08-16 MED ORDER — EZETIMIBE 10 MG PO TABS: ORAL_TABLET | ORAL | 1 refills | Status: DC

## 2017-08-16 MED ORDER — ATORVASTATIN CALCIUM 80 MG PO TABS: ORAL_TABLET | ORAL | 0 refills | Status: DC

## 2017-08-16 NOTE — Progress Notes (Signed)
This very nice 72 y.o. MWF presents for 3 month follow up with HTN, HLD, Pre-Diabetes and Vitamin D Deficiency. Other problems include Chronic LBP from severe thoracolumbar scoliosis s/p DDD fusion surgery x 3.She also was treated for Hepatitis C (1987).     Patient is treated for HTN (1986) & BP has been controlled at home. Today's BP is at goal - 118/72. Patient has had no complaints of any cardiac type chest pain, palpitations, dyspnea / orthopnea / PND, dizziness, claudication, or dependent edema.     Hyperlipidemia is controlled with diet & meds. Patient denies myalgias or other med SE's. Last Lipids were at goal:  Lab Results  Component Value Date   CHOL 187 05/11/2017   HDL 79 05/11/2017   LDLCALC 89 05/11/2017   TRIG 101 05/11/2017   CHOLHDL 2.4 05/11/2017      Also, the patient has history of Morbid Obesity (BMI 34+) and  PreDiabetes(A1c5.8%/2016) and has had no symptoms of reactive hypoglycemia, diabetic polys, paresthesias or visual blurring.  Last A1c was at goal: Lab Results  Component Value Date   HGBA1C 5.5 01/31/2017      Further, the patient also has history of Vitamin D Deficiency("12"/2008) and supplements vitamin D without any suspected side-effects. Last vitamin D was at goal:  Lab Results  Component Value Date   VD25OH 62 01/31/2017   Current Outpatient Medications on File Prior to Visit  Medication Sig  . ALPRAZolam (XANAX) 0.5 MG tablet TAKE ONE-HALF TO ONE TABLET BY MOUTH UP TO THREE TIMES A DAY AS NEEDED FOR ANXIETY OR SLEEP  . losartan (COZAAR) 100 MG tablet Take 1 tablet (100 mg total) by mouth daily.  . metroNIDAZOLE (METROCREAM) 0.75 % cream   . nystatin (NYSTATIN) powder Apply daily after a shower as needed   No current facility-administered medications on file prior to visit.    No Known Allergies PMHx:   Past Medical History:  Diagnosis Date  . Hepatitis C   . HTN (hypertension)   . Hyperlipidemia   . Obesity   . Pre-diabetes   .  Scoliosis   . Tobacco use    stopped 6 years ago.  . Vitamin D deficiency    Immunization History  Administered Date(s) Administered  . DTaP 03/06/2006  . Influenza Split 11/20/2013, 12/24/2014  . Influenza Whole 12/19/2012  . Influenza, High Dose Seasonal PF 01/04/2017  . Pneumococcal Conjugate-13 05/13/2014  . Pneumococcal Polysaccharide-23 03/06/2008, 10/25/2016  . Typhoid Live 03/06/2004  . Zoster 06/11/2007   Past Surgical History:  Procedure Laterality Date  . BACK SURGERY  1986   x 3  . BREAST RECONSTRUCTION     x 2  . CATARACT EXTRACTION     Bilateral   FHx:    Reviewed / unchanged  SHx:    Reviewed / unchanged  Systems Review:  Constitutional: Denies fever, chills, wt changes, headaches, insomnia, fatigue, night sweats, change in appetite. Eyes: Denies redness, blurred vision, diplopia, discharge, itchy, watery eyes.  ENT: Denies discharge, congestion, post nasal drip, epistaxis, sore throat, earache, hearing loss, dental pain, tinnitus, vertigo, sinus pain, snoring.  CV: Denies chest pain, palpitations, irregular heartbeat, syncope, dyspnea, diaphoresis, orthopnea, PND, claudication or edema. Respiratory: denies cough, dyspnea, DOE, pleurisy, hoarseness, laryngitis, wheezing.  Gastrointestinal: Denies dysphagia, odynophagia, heartburn, reflux, water brash, abdominal pain or cramps, nausea, vomiting, bloating, diarrhea, constipation, hematemesis, melena, hematochezia  or hemorrhoids. Genitourinary: Denies dysuria, frequency, urgency, nocturia, hesitancy, discharge, hematuria or flank pain. Musculoskeletal: Denies  arthralgias, myalgias, stiffness, jt. swelling, pain, limping or strain/sprain.  Skin: Denies pruritus, rash, hives, warts, acne, eczema or change in skin lesion(s). Neuro: No weakness, tremor, incoordination, spasms, paresthesia or pain. Psychiatric: Denies confusion, memory loss or sensory loss. Endo: Denies change in weight, skin or hair change.    Heme/Lymph: No excessive bleeding, bruising or enlarged lymph nodes.  Physical Exam  BP 118/72   Pulse (!) 52   Temp (!) 97.3 F (36.3 C)   Resp 16   Ht 5' 5.75" (1.67 m)   Wt 210 lb 9.6 oz (95.5 kg)   BMI 34.25 kg/m   Appears  well nourished, well groomed  and in no distress.  Eyes: PERRLA, EOMs, conjunctiva no swelling or erythema. Sinuses: No frontal/maxillary tenderness ENT/Mouth: EAC's clear, TM's nl w/o erythema, bulging. Nares clear w/o erythema, swelling, exudates. Oropharynx clear without erythema or exudates. Oral hygiene is good. Tongue normal, non obstructing. Hearing intact.  Neck: Supple. Thyroid not palpable. Car 2+/2+ without bruits, nodes or JVD. Chest: Respirations nl with BS clear & equal w/o rales, rhonchi, wheezing or stridor.  Cor: Heart sounds normal w/ regular rate and rhythm without sig. murmurs, gallops, clicks or rubs. Peripheral pulses normal and equal  without edema.  Abdomen: Soft & bowel sounds normal. Non-tender w/o guarding, rebound, hernias, masses or organomegaly.  Lymphatics: Unremarkable.  Musculoskeletal: Full ROM all peripheral extremities, joint stability, 5/5 strength and normal gait.  Skin: Warm, dry without exposed rashes, lesions or ecchymosis apparent.  Neuro: Cranial nerves intact, reflexes equal bilaterally. Sensory-motor testing grossly intact. Tendon reflexes grossly intact.  Pysch: Alert & oriented x 3.  Insight and judgement nl & appropriate. No ideations.  Assessment and Plan:  1. Essential hypertension  - Continue medication, monitor blood pressure at home.  - Continue DASH diet.  Reminder to go to the ER if any CP,  SOB, nausea, dizziness, severe HA, changes vision/speech.  - CBC with Differential/Platelet - COMPLETE METABOLIC PANEL WITH GFR - Magnesium - TSH  2. Hyperlipidemia, mixed  - Continue diet/meds, exercise,& lifestyle modifications.  - Continue monitor periodic cholesterol/liver & renal functions   -  Lipid panel - TSH  3. Abnormal glucose  - Continue diet, exercise, lifestyle modifications.  - Monitor appropriate labs.  - Hemoglobin A1c - Insulin, random  4. Vitamin D deficiency  - Continue supplementation.  - VITAMIN D 25 Hydroxyl  5. Prediabetes  - Hemoglobin A1c - Insulin, random  6. Medication management  - CBC with Differential/Platelet - COMPLETE METABOLIC PANEL WITH GFR - Magnesium - Lipid panel - TSH - Hemoglobin A1c - Insulin, random - VITAMIN D 25 Hydroxyl       Discussed  regular exercise, BP monitoring, weight control to achieve/maintain BMI less than 25 and discussed med and SE's. Recommended labs to assess and monitor clinical status with further disposition pending results of labs. Over 30 minutes of exam, counseling, chart review was performed.

## 2017-08-16 NOTE — Patient Instructions (Signed)

## 2017-08-17 LAB — CBC WITH DIFFERENTIAL/PLATELET
BASOS ABS: 49 {cells}/uL (ref 0–200)
Basophils Relative: 0.6 %
EOS PCT: 0.6 %
Eosinophils Absolute: 49 cells/uL (ref 15–500)
HEMATOCRIT: 40 % (ref 35.0–45.0)
Hemoglobin: 14 g/dL (ref 11.7–15.5)
LYMPHS ABS: 1714 {cells}/uL (ref 850–3900)
MCH: 30.3 pg (ref 27.0–33.0)
MCHC: 35 g/dL (ref 32.0–36.0)
MCV: 86.6 fL (ref 80.0–100.0)
MPV: 9.3 fL (ref 7.5–12.5)
Monocytes Relative: 9.6 %
NEUTROS PCT: 68.3 %
Neutro Abs: 5601 cells/uL (ref 1500–7800)
PLATELETS: 312 10*3/uL (ref 140–400)
RBC: 4.62 10*6/uL (ref 3.80–5.10)
RDW: 12.9 % (ref 11.0–15.0)
Total Lymphocyte: 20.9 %
WBC: 8.2 10*3/uL (ref 3.8–10.8)
WBCMIX: 787 {cells}/uL (ref 200–950)

## 2017-08-17 LAB — COMPLETE METABOLIC PANEL WITH GFR
AG RATIO: 1.8 (calc) (ref 1.0–2.5)
ALKALINE PHOSPHATASE (APISO): 72 U/L (ref 33–130)
ALT: 14 U/L (ref 6–29)
AST: 23 U/L (ref 10–35)
Albumin: 4.7 g/dL (ref 3.6–5.1)
BILIRUBIN TOTAL: 0.9 mg/dL (ref 0.2–1.2)
BUN: 17 mg/dL (ref 7–25)
CHLORIDE: 88 mmol/L — AB (ref 98–110)
CO2: 30 mmol/L (ref 20–32)
Calcium: 10.3 mg/dL (ref 8.6–10.4)
Creat: 0.86 mg/dL (ref 0.60–0.93)
GFR, EST AFRICAN AMERICAN: 77 mL/min/{1.73_m2} (ref >=60)
GFR, Est Non African American: 67 mL/min/{1.73_m2} (ref >=60)
GLUCOSE: 83 mg/dL (ref 65–99)
Globulin: 2.6 g/dL (calc) (ref 1.9–3.7)
POTASSIUM: 4.2 mmol/L (ref 3.5–5.3)
Sodium: 128 mmol/L — ABNORMAL LOW (ref 135–146)
TOTAL PROTEIN: 7.3 g/dL (ref 6.1–8.1)

## 2017-08-17 LAB — LIPID PANEL
Cholesterol: 213 mg/dL — ABNORMAL HIGH (ref <200)
HDL: 95 mg/dL (ref >50)
LDL Cholesterol (Calc): 103 mg/dL (calc) — ABNORMAL HIGH
NON-HDL CHOLESTEROL (CALC): 118 mg/dL (ref <130)
TRIGLYCERIDES: 66 mg/dL (ref <150)
Total CHOL/HDL Ratio: 2.2 (calc) (ref <5.0)

## 2017-08-17 LAB — HEMOGLOBIN A1C
EAG (MMOL/L): 6.5 (calc)
Hgb A1c MFr Bld: 5.7 % of total Hgb — ABNORMAL HIGH (ref <5.7)
Mean Plasma Glucose: 117 (calc)

## 2017-08-17 LAB — INSULIN, RANDOM: INSULIN: 15.5 u[IU]/mL (ref 2.0–19.6)

## 2017-08-17 LAB — VITAMIN D 25 HYDROXY (VIT D DEFICIENCY, FRACTURES): Vit D, 25-Hydroxy: 63 ng/mL (ref 30–100)

## 2017-08-17 LAB — TSH: TSH: 1.9 m[IU]/L (ref 0.40–4.50)

## 2017-08-17 LAB — MAGNESIUM: MAGNESIUM: 1.5 mg/dL (ref 1.5–2.5)

## 2017-08-19 ENCOUNTER — Encounter: Payer: Self-pay | Admitting: Internal Medicine

## 2017-10-05 ENCOUNTER — Other Ambulatory Visit: Payer: Self-pay | Admitting: Adult Health

## 2017-10-05 DIAGNOSIS — G4700 Insomnia, unspecified: Secondary | ICD-10-CM

## 2017-11-27 ENCOUNTER — Ambulatory Visit: Payer: Self-pay | Admitting: Adult Health

## 2017-12-11 ENCOUNTER — Encounter: Payer: Self-pay | Admitting: Adult Health

## 2017-12-11 DIAGNOSIS — I5030 Unspecified diastolic (congestive) heart failure: Secondary | ICD-10-CM | POA: Insufficient documentation

## 2017-12-11 NOTE — Progress Notes (Signed)
Patient ID: Deanna Gomez, female   DOB: [birth year], 72 y.o.   MRN: [id number]   MEDICARE ANNUAL WELLNESS VISIT AND 3 MONTH  Assessment:   Encounter for Medicare annual wellness exam 1 year  Atherosclerosis of aorta Control blood pressure, cholesterol, glucose, increase exercise.   Diastolic CHF Grade 2 dysfunction, improved on diuretic Weight stable/down Followed by cardiology  Essential hypertension - continue medications, DASH diet, exercise and monitor at home. Call if greater than 130/80.  -     CBC with Differential/Platelet -     CMP/GFR -     TSH  Hepatitis C virus infection without hepatic coma, unspecified chronicity -     CMP/GFR  Hepatic steatosis Weight loss advised, avoid alcohol/tylenol, will monitor LFTs  Hyperlipidemia -continue medications, check lipids, decrease fatty foods, increase activity.  -     Lipid panel  Prediabetes Discussed general issues about diabetes pathophysiology and management., Educational material distributed., Suggested low cholesterol diet., Encouraged aerobic exercise., Discussed foot care., Reminded to get yearly retinal exam.  Medication management -     Magnesium  Obesity Long discussion about weight loss, diet, and exercise Recommended diet heavy in fruits and veggies and low in animal meats, cheeses, and dairy products, appropriate calorie intake Patient will work on getting back on metabolism regulation diet and start working Discussed appropriate weight for height and initial goal (200lb) Follow up at next visit  Vitamin D deficiency At goal at recent check; continue to recommend supplementation for goal of 70-100 Defer vitamin D level  Chronic midline back pain Hx of severe scoliosis with fusion 10+ years ago, [place] provider in [place] Recent pain worse last 6 months Requesting referral to local spine specialist  Future Appointments  Date Time Provider Department Center  03/07/2018  9:00 AM Lucky Cowboy, MD [facility] None     Plan:   During the course of the visit the patient was educated and counseled about appropriate screening and preventive services including:    Pneumococcal vaccine   Influenza vaccine  Td vaccine  Screening electrocardiogram  Bone densitometry screening  Colorectal cancer screening  Diabetes screening  Glaucoma screening  Nutrition counseling   Advanced directives: requested   Subjective:   Deanna Gomez is a 72 y.o. female who presents for Medicare Annual Wellness Visit and follow up. Other problems include Chronic LBP from severe thoracolumbar scoliosis s/p DDD fusion surgery x 3.She also was treated for Hepatitis C (1987). She is also newly established with cardiology after ECHO ordered due to exertional dyspnea demonstrated grade 2 diastolic dysfunction; BP and symptoms improved after transition from HCTZ to chlorthalidone.    BMI is Body mass index is 34.64 kg/m., she has not been working on diet and exercise. Wt Readings from Last 3 Encounters:  12/12/17 213 lb (96.6 kg)  08/16/17 210 lb 9.6 oz (95.5 kg)  07/17/17 211 lb (95.7 kg)   She has had elevated blood pressure since 1996. Her blood pressure has been controlled at home, reports somewhat labile & today their BP is BP: 140/90  She does not workout. She denies chest pain, shortness of breath, dizziness.   She is on cholesterol medication (atorvastatin 80 mg, zetia 10 mg daily) and denies myalgias. Her cholesterol is not at goal. The cholesterol last visit was:  Lab Results  Component Value Date   CHOL 213 (H) 08/16/2017   HDL 95 08/16/2017   LDLCALC 103 (H) 08/16/2017   TRIG 66 08/16/2017   CHOLHDL 2.2  08/16/2017   She has had prediabetes since 2012. She has been working on diet and exercise for prediabetes, and denies foot ulcerations, hyperglycemia, hypoglycemia , increased appetite, nausea, paresthesia of the feet, polydipsia, polyuria, visual disturbances,  vomiting and weight loss. Last A1C in the office was:  Lab Results  Component Value Date   HGBA1C 5.7 (H) 08/16/2017   Patient is on Vitamin D supplement.   Lab Results  Component Value Date   VD25OH 63 08/16/2017      Lab Results  Component Value Date   GFRNONAA 67 08/16/2017      Medication Review: Current Outpatient Medications on File Prior to Visit  Medication Sig  . ALPRAZolam (XANAX) 0.5 MG tablet Take 1/2 to 1 tablet 2 to 3 x /day ONLY if needed for Anxiety or Sleep & please try to limit to 5 days /week to avoid addiction  . atenolol (TENORMIN) 100 MG tablet Take 1 tablet (100 mg total) by mouth daily.  Marland Kitchen atorvastatin (LIPITOR) 80 MG tablet TAKE 1/2 TO 1 (ONE-HALF TO ONE) TABLET BY MOUTH DAILY FOR CHOLESTEROL OR AS DIRECTED  . chlorthalidone (HYGROTON) 25 MG tablet Take 1 tablet (25 mg total) by mouth daily. (Patient taking differently: Take 50 mg by mouth daily. )  . ezetimibe (ZETIA) 10 MG tablet TAKE ONE TABLET BY MOUTH DAILY FOR CHOLESTEROL  . losartan (COZAAR) 100 MG tablet Take 1 tablet (100 mg total) by mouth daily.  Marland Kitchen nystatin (NYSTATIN) powder Apply daily after a shower as needed   No current facility-administered medications on file prior to visit.     Current Problems (verified) Patient Active Problem List   Diagnosis Date Noted  . Diastolic CHF (HCC) 12/11/2017  . Hepatic steatosis 12/26/2016  . Abdominal aortic atherosclerosis (HCC) 12/26/2016  . Obesity (BMI 30.0-34.9) 10/15/2014  . Essential hypertension 03/09/2013  . Hyperlipidemia 03/09/2013  . Other abnormal glucose 03/09/2013  . Vitamin D deficiency 03/09/2013  . Hepatitis C virus infection 03/09/2013    Screening Tests Immunization History  Administered Date(s) Administered  . DTaP 03/06/2006  . Influenza Split 11/20/2013, 12/24/2014  . Influenza Whole 12/19/2012  . Influenza, High Dose Seasonal PF 01/04/2017, 12/10/2017  . Pneumococcal Conjugate-13 05/13/2014  . Pneumococcal  Polysaccharide-23 03/06/2008, 10/25/2016  . Typhoid Live 03/06/2004  . Zoster 06/11/2007   Preventative care: Last colonoscopy: 10/2014, due 2021 CXR 06/2017 MGM 06/2017 Echo 2019 - grade 2 diastolic Stress test 2013 PAP 13/0865 DEXA: 2010, 2018 - T -1.7  Tdap: 2008, declines Influenza: 2019 Pneumonia: 1020, 2018 Prevnar: 2016 Shingles: 2009  Names of Other Physician/Practitioners you currently use: 1. [facility] here for primary care 2. Jimmey Ralph, eye doctor, last visit 02/2017 3. Dr. Ardelle Anton, Dentist, last visit 2019, goes q44m  Patient Care Team: Lucky Cowboy, MD as PCP - General (Internal Medicine) Sharrell Ku, MD as Consulting Physician (Gastroenterology) Teodora Medici, MD as Consulting Physician (Gynecology) Elmon Else, MD as Consulting Physician (Dermatology) [name], DO (Ophthalmology)   History reviewed: allergies, current medications, past family history, past medical history, past social history, past surgical history and problem list  Allergies Allergies  Allergen Reactions  . Fentanyl Other (See Comments)    Intolerlance    SURGICAL HISTORY She  has a past surgical history that includes Back surgery (1986); Breast reconstruction; and Cataract extraction. FAMILY HISTORY Her family history includes Coronary artery disease in her brother; Hypertension in her mother. SOCIAL HISTORY She  reports that she quit smoking about 13 years  ago. Her smoking use included cigarettes. She has a 30.00 pack-year smoking history. She has never used smokeless tobacco. She reports that she drinks about 14.0 standard drinks of alcohol per week. She reports that she does not use drugs.  MEDICARE WELLNESS OBJECTIVES: Physical activity: Current Exercise Habits: The patient does not participate in regular exercise at present, Exercise limited by: orthopedic condition(s) Cardiac risk factors: Cardiac Risk Factors  include: hypertension;dyslipidemia;advanced age (>56men, >8 women);obesity (BMI >30kg/m2);sedentary lifestyle;smoking/ tobacco exposure Depression/mood screen:   Depression screen [facility] 2/9 12/12/2017  Decreased Interest 0  Down, Depressed, Hopeless 0  PHQ - 2 Score 0    ADLs:  In your present state of health, do you have any difficulty performing the following activities: 12/12/2017 08/19/2017  Hearing? N N  Vision? N N  Difficulty concentrating or making decisions? N N  Walking or climbing stairs? N N  Dressing or bathing? N N  Doing errands, shopping? N N  Some recent data might be hidden     Cognitive Testing  Alert? Yes  Normal Appearance?Yes  Oriented to person? Yes  Place? Yes   Time? Yes  Recall of three objects?  Yes  Can perform simple calculations? Yes  Displays appropriate judgment?Yes  Can read the correct time from a watch face?Yes  EOL planning: Does Patient Have a Medical Advance Directive?: Yes Type of Advance Directive: Living will, Healthcare Power of Attorney Does patient want to make changes to medical advance directive?: No - Patient declined Copy of Healthcare Power of Attorney in Chart?: No - copy requested   Objective:     BP 140/90   Pulse 62   Temp (!) 97.3 F (36.3 C)   Ht 5' 5.75" (1.67 m)   Wt 213 lb (96.6 kg)   SpO2 93%   BMI 34.64 kg/m   General Appearance: Well nourished, alert, WD/WN, femaleand in no apparent distress. Eyes: PERRLA, EOMs, conjunctiva no swelling or erythema, normal fundi and vessels. Sinuses: No frontal/maxillary tenderness ENT/Mouth: EACs patent / TMs  nl. Nares clear without erythema, swelling, mucoid exudates. Oral hygiene is good. No erythema, swelling, or exudate. Tongue normal, non-obstructing. Tonsils not swollen or erythematous. Hearing normal.  Neck: Supple, thyroid normal. No bruits, nodes or JVD. Respiratory: Respiratory effort normal.  BS equal and clear bilateral without rales, rhonci, wheezing or  stridor. Cardio: Heart sounds are normal with regular rate and rhythm and no murmurs, rubs or gallops. Peripheral pulses are normal and equal bilaterally without edema. No aortic or femoral bruits. Chest: symmetric with normal excursions and percussion.  Abdomen: Flat, soft  with nl bowel sounds. Nontender, no guarding, rebound, hernias, masses, or organomegaly.  Lymphatics: Non tender without lymphadenopathy.  Musculoskeletal: Full ROM all peripheral extremities, joint stability, 5/5 strength, and normal gait. Mildly scoliotic spine, neg straight leg raise Skin: Warm and dry without rashes, lesions, cyanosis, clubbing or  ecchymosis.  Neuro: Cranial nerves intact, reflexes equal bilaterally. Normal muscle tone, no cerebellar symptoms. Sensation intact.  Pysch: Awake and oriented X 3, normal affect, Insight and Judgment appropriate.   Medicare Attestation I have personally reviewed: The patient's medical and social history Their use of alcohol, tobacco or illicit drugs Their current medications and supplements The patient's functional ability including ADLs,fall risks, home safety risks, cognitive, and hearing and visual impairment Diet and physical activities Evidence for depression or mood disorders  The patient's weight, height, BMI, and visual acuity have been recorded in the chart.  I have made referrals, counseling, and provided education  to the patient based on review of the above and I have provided the patient with a written personalized care plan for preventive services.    Dan Maker, NP   12/12/2017

## 2017-12-12 ENCOUNTER — Encounter: Payer: Self-pay | Admitting: Adult Health

## 2017-12-12 ENCOUNTER — Ambulatory Visit: Payer: Medicare Other | Admitting: Adult Health

## 2017-12-12 VITALS — BP 140/90 | HR 62 | Temp 97.3°F | Ht 65.75 in | Wt 213.0 lb

## 2017-12-12 DIAGNOSIS — K760 Fatty (change of) liver, not elsewhere classified: Secondary | ICD-10-CM

## 2017-12-12 DIAGNOSIS — E782 Mixed hyperlipidemia: Secondary | ICD-10-CM

## 2017-12-12 DIAGNOSIS — I700 Atherosclerosis of aorta: Secondary | ICD-10-CM | POA: Diagnosis not present

## 2017-12-12 DIAGNOSIS — R6889 Other general symptoms and signs: Secondary | ICD-10-CM

## 2017-12-12 DIAGNOSIS — Z0001 Encounter for general adult medical examination with abnormal findings: Secondary | ICD-10-CM | POA: Diagnosis not present

## 2017-12-12 DIAGNOSIS — R7309 Other abnormal glucose: Secondary | ICD-10-CM

## 2017-12-12 DIAGNOSIS — Z0000 Encounter for general adult medical examination without abnormal findings: Secondary | ICD-10-CM

## 2017-12-12 DIAGNOSIS — E559 Vitamin D deficiency, unspecified: Secondary | ICD-10-CM | POA: Diagnosis not present

## 2017-12-12 DIAGNOSIS — E66811 Obesity, class 1: Secondary | ICD-10-CM

## 2017-12-12 DIAGNOSIS — B1920 Unspecified viral hepatitis C without hepatic coma: Secondary | ICD-10-CM | POA: Diagnosis not present

## 2017-12-12 DIAGNOSIS — I5032 Chronic diastolic (congestive) heart failure: Secondary | ICD-10-CM

## 2017-12-12 DIAGNOSIS — M549 Dorsalgia, unspecified: Secondary | ICD-10-CM

## 2017-12-12 DIAGNOSIS — I10 Essential (primary) hypertension: Secondary | ICD-10-CM

## 2017-12-12 DIAGNOSIS — E669 Obesity, unspecified: Secondary | ICD-10-CM

## 2017-12-12 DIAGNOSIS — G8929 Other chronic pain: Secondary | ICD-10-CM

## 2017-12-12 NOTE — Patient Instructions (Addendum)
  Ms. Mainor , Thank you for taking time to come for your Medicare Wellness Visit. I appreciate your ongoing commitment to your health goals. Please review the following plan we discussed and let me know if I can assist you in the future.   These are the goals we discussed: Goals   None     This is a list of the screening recommended for you and due dates:  Health Maintenance  Topic Date Due  . Tetanus Vaccine  03/06/2016  . Flu Shot  10/04/2017  . Mammogram  06/26/2019  . Colon Cancer Screening  10/28/2024  . DEXA scan (bone density measurement)  Completed  .  Hepatitis C: One time screening is recommended by Center for Disease Control  (CDC) for  adults born from [birth year] through 1967.   Completed  . Pneumonia vaccines  Completed     Drink 1/2 your body weight in fluid ounces of water daily; drink a tall glass of water 30 min before meals  Don't eat until you're stuffed- listen to your stomach and eat until you are 80% full   Try eating off of a salad plate; wait 10 min after finishing before going back for seconds  Start by eating the vegetables on your plate; aim for 16% of your meals to be fruits or vegetables  Then eat your protein - lean meats (grass fed if possible), fish, beans, nuts in moderation  Eat your carbs/starch last ONLY if you still are hungry. If you can, stop before finishing it all  Avoid sugar and flour - the closer it looks to it's original form in nature, typically the better it is for you  Splurge in moderation - "assign" days when you get to splurge and have the "bad stuff" - I like to follow a 80% - 20% plan- "good" choices 80 % of the time, "bad" choices in moderation 20% of the time  Simple equation is: Calories out > calories in = weight loss - even if you eat the bad stuff, if you limit portions, you will still lose weight    Know what a healthy weight is for you (roughly BMI <25) and aim to maintain this  Aim for 7+ servings of fruits and  vegetables daily  65-80+ fluid ounces of water or unsweet tea for healthy kidneys  Limit to max 1 drink of alcohol per day; avoid smoking/tobacco  Limit animal fats in diet for cholesterol and heart health - choose grass fed whenever available  Avoid highly processed foods, and foods high in saturated/trans fats  Aim for low stress - take time to unwind and care for your mental health  Aim for 150 min of moderate intensity exercise weekly for heart health, and weights twice weekly for bone health  Aim for 7-9 hours of sleep daily

## 2017-12-13 ENCOUNTER — Telehealth: Payer: Self-pay | Admitting: Internal Medicine

## 2017-12-13 LAB — COMPLETE METABOLIC PANEL WITH GFR
AG RATIO: 1.7 (calc) (ref 1.0–2.5)
ALBUMIN MSPROF: 4.4 g/dL (ref 3.6–5.1)
ALKALINE PHOSPHATASE (APISO): 71 U/L (ref 33–130)
ALT: 13 U/L (ref 6–29)
AST: 18 U/L (ref 10–35)
BUN: 13 mg/dL (ref 7–25)
CO2: 30 mmol/L (ref 20–32)
Calcium: 10.3 mg/dL (ref 8.6–10.4)
Chloride: 90 mmol/L — ABNORMAL LOW (ref 98–110)
Creat: 0.66 mg/dL (ref 0.60–0.93)
GFR, Est African American: 101 mL/min/{1.73_m2} (ref >=60)
GFR, Est Non African American: 87 mL/min/{1.73_m2} (ref >=60)
GLOBULIN: 2.6 g/dL (ref 1.9–3.7)
Glucose, Bld: 97 mg/dL (ref 65–99)
Potassium: 4.3 mmol/L (ref 3.5–5.3)
SODIUM: 133 mmol/L — AB (ref 135–146)
Total Bilirubin: 0.9 mg/dL (ref 0.2–1.2)
Total Protein: 7 g/dL (ref 6.1–8.1)

## 2017-12-13 LAB — LIPID PANEL
Cholesterol: 211 mg/dL — ABNORMAL HIGH (ref <200)
HDL: 82 mg/dL (ref >50)
LDL CHOLESTEROL (CALC): 112 mg/dL — AB
Non-HDL Cholesterol (Calc): 129 mg/dL (calc) (ref <130)
Total CHOL/HDL Ratio: 2.6 (calc) (ref <5.0)
Triglycerides: 84 mg/dL (ref <150)

## 2017-12-13 LAB — CBC WITH DIFFERENTIAL/PLATELET
BASOS ABS: 27 {cells}/uL (ref 0–200)
Basophils Relative: 0.4 %
Eosinophils Absolute: 27 cells/uL (ref 15–500)
Eosinophils Relative: 0.4 %
HEMATOCRIT: 40.6 % (ref 35.0–45.0)
Hemoglobin: 14 g/dL (ref 11.7–15.5)
LYMPHS ABS: 1312 {cells}/uL (ref 850–3900)
MCH: 30.7 pg (ref 27.0–33.0)
MCHC: 34.5 g/dL (ref 32.0–36.0)
MCV: 89 fL (ref 80.0–100.0)
MPV: 9.9 fL (ref 7.5–12.5)
Monocytes Relative: 9.8 %
NEUTROS PCT: 70.1 %
Neutro Abs: 4767 cells/uL (ref 1500–7800)
PLATELETS: 272 10*3/uL (ref 140–400)
RBC: 4.56 10*6/uL (ref 3.80–5.10)
RDW: 11.8 % (ref 11.0–15.0)
TOTAL LYMPHOCYTE: 19.3 %
WBC: 6.8 10*3/uL (ref 3.8–10.8)
WBCMIX: 666 {cells}/uL (ref 200–950)

## 2017-12-13 LAB — MAGNESIUM: MAGNESIUM: 1.6 mg/dL (ref 1.5–2.5)

## 2017-12-13 LAB — TSH: TSH: 1.76 mIU/L (ref 0.40–4.50)

## 2017-12-13 LAB — HEMOGLOBIN A1C
HEMOGLOBIN A1C: 5.4 %{Hb} (ref <5.7)
MEAN PLASMA GLUCOSE: 108 (calc)
eAG (mmol/L): 6 (calc)

## 2017-12-16 ENCOUNTER — Other Ambulatory Visit: Payer: Self-pay | Admitting: Internal Medicine

## 2017-12-16 DIAGNOSIS — E785 Hyperlipidemia, unspecified: Secondary | ICD-10-CM

## 2017-12-19 NOTE — Telephone Encounter (Signed)
error 

## 2018-02-06 ENCOUNTER — Encounter: Payer: Self-pay | Admitting: Internal Medicine

## 2018-02-08 ENCOUNTER — Other Ambulatory Visit: Payer: Self-pay | Admitting: Internal Medicine

## 2018-02-08 DIAGNOSIS — G4700 Insomnia, unspecified: Secondary | ICD-10-CM

## 2018-02-08 MED ORDER — ALPRAZOLAM 0.5 MG PO TABS: ORAL_TABLET | ORAL | 0 refills | Status: DC

## 2018-03-07 ENCOUNTER — Encounter: Payer: Self-pay | Admitting: Internal Medicine

## 2018-03-24 ENCOUNTER — Encounter: Payer: Self-pay | Admitting: Internal Medicine

## 2018-03-24 NOTE — Patient Instructions (Signed)

## 2018-03-24 NOTE — Progress Notes (Addendum)
[facility] Deanna CowboyWilliam Kamaryn Gomez, M.D.     Deanna CarrelAmanda R. Steffanie Gomez, P.A.-C Deanna GaudierAshley Corbett, DNP [facility] [address] [place], [place] [id number] Telephone [phone] Telefax [phone] Annual Screening/Preventative Visit & Comprehensive Evaluation &  Examination     This very nice 73 y.o. MWF presents for a Screening /Preventative Visit & comprehensive evaluation and management of multiple medical co-morbidities.  Patient has been followed for HTN, HLD, Prediabetes  and Vitamin D Deficiency.      HTN predates circa 1996. Patient's BP has been controlled at home and patient denies any cardiac symptoms as chest pain, palpitations, shortness of breath, dizziness or ankle swelling.  In 2013, she had a Negative Myoview. She does have CKD2 attributed to her HTN. Today's BP is at goal - 126/82.      Patient's hyperlipidemia is not controlled with diet and Lipitor (3 x/wk) & Zetia. Patient denies myalgias or other medication SE's. Last lipids were not at goal: Lab Results  Component Value Date   CHOL 211 (H) 12/12/2017   HDL 82 12/12/2017   LDLCALC 112 (H) 12/12/2017   TRIG 84 12/12/2017   CHOLHDL 2.6 12/12/2017      Patient has Morbid Obesity (BMI 35+) and  hx/o prediabetes (A1c 5.8% / 2016) and patient denies reactive hypoglycemic symptoms, visual blurring, diabetic polys or paresthesias. Last A1c was Normal & at goal: Lab Results  Component Value Date   HGBA1C 5.4 12/12/2017      Finally, patient has history of Vitamin D Deficiency ("12" / 2008)  and last Vitamin D was at goal: Lab Results  Component Value Date   VD25OH 63 08/16/2017   Current Outpatient Medications on File Prior to Visit  Medication Sig  . ALPRAZolam (XANAX) 0.5 MG tablet Take 1/2 to 1 tablet 2 to 3 x /day ONLY if needed for Anxiety or Sleep & please try to limit to 5 days /week to avoid addiction  . aspirin EC 81 MG tablet Take 81 mg by mouth daily.  Marland Kitchen.  atenolol (TENORMIN) 100 MG tablet Take 1 tablet (100 mg total) by mouth daily.  Marland Kitchen. atorvastatin (LIPITOR) 80 MG tablet TAKE ONE-HALF TO ONE TABLET BY MOUTH DAILY FOR CHOLESTEROL  . Cholecalciferol (VITAMIN D3 PO) Take by mouth daily.  Marland Kitchen. ezetimibe (ZETIA) 10 MG tablet TAKE ONE TABLET BY MOUTH DAILY FOR CHOLESTEROL  . losartan (COZAAR) 100 MG tablet Take 1 tablet (100 mg total) by mouth daily.  . Magnesium 500 MG TABS Take by mouth daily.  Marland Kitchen. nystatin (NYSTATIN) powder Apply daily after a shower as needed  . chlorthalidone (HYGROTON) 25 MG tablet Take 1 tablet (25 mg total) by mouth daily. (Patient taking differently: Take 50 mg by mouth daily. )   No current facility-administered medications on file prior to visit.    Allergies  Allergen Reactions  . Fentanyl Other (See Comments)    Intolerlance   Past Medical History:  Diagnosis Date  . Gastric ulcer 11/19/2014  . Hepatitis C   . HTN (hypertension)   . Hyperlipidemia   . Obesity   . Pre-diabetes   . Scoliosis   . Tobacco use    stopped 6 years ago.  . Vitamin D deficiency    Health Maintenance  Topic Date Due  . TETANUS/TDAP  12/13/2018 (Originally 03/06/2016)  . MAMMOGRAM  06/26/2019  . COLONOSCOPY  10/28/2024  . INFLUENZA VACCINE  Completed  . DEXA SCAN  Completed  . Hepatitis C Screening  Completed  . PNA vac Low Risk Adult  Completed   Immunization History  Administered Date(s) Administered  . DTaP 03/06/2006  . Influenza Split 11/20/2013, 12/24/2014  . Influenza Whole 12/19/2012  . Influenza, High Dose Seasonal PF 01/04/2017, 12/10/2017  . Pneumococcal Conjugate-13 05/13/2014  . Pneumococcal Polysaccharide-23 03/06/2008, 10/25/2016  . Typhoid Live 03/06/2004  . Zoster 06/11/2007   Last Colon -  Last MGM -  Past Surgical History:  Procedure Laterality Date  . BACK SURGERY  1986   x 3  . BREAST RECONSTRUCTION     x 2  . CATARACT EXTRACTION     Bilateral   Family History  Problem Relation Age of Onset  .  Hypertension Mother   . Coronary artery disease Brother    Social History   Tobacco Use  . Smoking status: Former Smoker    Packs/day: 1.00    Years: 30.00    Pack years: 30.00    Types: Cigarettes    Last attempt to quit: 08/28/2004    Years since quitting: 13.5  . Smokeless tobacco: Never Used  . Tobacco comment: QUIT SIX YEARS AGO  Substance Use Topics  . Alcohol use: Yes    Alcohol/week: 14.0 standard drinks    Types: 14 Standard drinks or equivalent per week    Comment: Drinks 2-3 glasses wine nightly x 20 years  . Drug use: No    ROS Constitutional: Denies fever, chills, weight loss/gain, headaches, insomnia,  night sweats, and change in appetite. Does c/o fatigue. Eyes: Denies redness, blurred vision, diplopia, discharge, itchy, watery eyes.  ENT: Denies discharge, congestion, post nasal drip, epistaxis, sore throat, earache, hearing loss, dental pain, Tinnitus, Vertigo, Sinus pain, snoring.  Cardio: Denies chest pain, palpitations, irregular heartbeat, syncope, dyspnea, diaphoresis, orthopnea, PND, claudication, edema Respiratory: denies cough, dyspnea, DOE, pleurisy, hoarseness, laryngitis, wheezing.  Gastrointestinal: Denies dysphagia, heartburn, reflux, water brash, pain, cramps, nausea, vomiting, bloating, diarrhea, constipation, hematemesis, melena, hematochezia, jaundice, hemorrhoids Genitourinary: Denies dysuria, frequency, urgency, nocturia, hesitancy, discharge, hematuria, flank pain Breast: Breast lumps, nipple discharge, bleeding.  Musculoskeletal: Denies arthralgia, myalgia, stiffness, Jt. Swelling, pain, limp, and strain/sprain. Denies falls. Skin: Denies puritis, rash, hives, warts, acne, eczema, changing in skin lesion Neuro: No weakness, tremor, incoordination, spasms, paresthesia, pain Psychiatric: Denies confusion, memory loss, sensory loss. Denies Depression. Endocrine: Denies change in weight, skin, hair change, nocturia, and paresthesia, diabetic polys,  visual blurring, hyper / hypo glycemic episodes.  Heme/Lymph: No excessive bleeding, bruising, enlarged lymph nodes.  Physical Exam  BP 126/82   Pulse 68   Temp (!) 97.2 F (36.2 C)   Ht 5' 5.75" (1.67 m)   Wt 215 lb 6.4 oz (97.7 kg)   SpO2 96%   BMI 35.03 kg/m   General Appearance: Well nourished, well groomed and in no apparent distress.  Eyes: PERRLA, EOMs, conjunctiva no swelling or erythema, normal fundi and vessels. Sinuses: No frontal/maxillary tenderness ENT/Mouth: EACs patent / TMs  nl. Nares clear without erythema, swelling, mucoid exudates. Oral hygiene is good. No erythema, swelling, or exudate. Tongue normal, non-obstructing. Tonsils not swollen or erythematous. Hearing normal.  Neck: Supple, thyroid not palpable. No bruits, nodes or JVD. Respiratory: Respiratory effort normal.  BS equal and clear bilateral without rales, rhonci, wheezing or stridor. Cardio: Heart sounds are normal with regular rate and rhythm and no murmurs, rubs or gallops. Peripheral pulses are normal and equal bilaterally without edema. No aortic or femoral bruits. Chest: symmetric with normal excursions and percussion. Breasts: Symmetric, without lumps,  nipple discharge, retractions, or fibrocystic changes.  Abdomen: Flat, soft with bowel sounds active. Nontender, no guarding, rebound, hernias, masses, or organomegaly.  Lymphatics: Non tender without lymphadenopathy.  Genitourinary:  Musculoskeletal: Full ROM all peripheral extremities, joint stability, 5/5 strength, and normal gait. Skin: Warm and dry without rashes, lesions, cyanosis, clubbing or  ecchymosis.  Neuro: Cranial nerves intact, reflexes equal bilaterally. Normal muscle tone, no cerebellar symptoms. Sensation intact.  Pysch: Alert and oriented X 3, normal affect, Insight and Judgment appropriate.   Assessment and Plan  1. Annual Preventative Screening Examination  2. Essential hypertension  - EKG 12-Lead - Urinalysis, Routine w  reflex microscopic - Microalbumin / creatinine urine ratio - CBC with Differential/Platelet - COMPLETE METABOLIC PANEL WITH GFR - Magnesium - TSH  3. Hyperlipidemia, mixed  - EKG 12-Lead - Lipid panel - TSH  4. Abnormal glucose  - EKG 12-Lead - Hemoglobin A1c - Insulin, random  5. Vitamin D deficiency  - VITAMIN D 25 Hydroxyl  6. Prediabetes  - EKG 12-Lead - Hemoglobin A1c - Insulin, random  7. Class 1 obesity due to excess calories with serious comorbidity and body mass index (BMI) of 34.0 to 34.9 in adult  - Hemoglobin A1c - Insulin, random  8. Abdominal aortic atherosclerosis (HCC)  - EKG 12-Lead  9. Screening for colorectal cancer  - POC Hemoccult Bld/Stl  10. Screening for ischemic heart disease  - EKG 12-Lead  11. FH: hypertension  - EKG 12-Lead  12. Former smoker  - EKG 12-Lead  13. Medication management  - Urinalysis, Routine w reflex microscopic - Microalbumin / creatinine urine ratio - CBC with Differential/Platelet - COMPLETE METABOLIC PANEL WITH GFR - Magnesium - Lipid panel - TSH - Hemoglobin A1c - Insulin, random - VITAMIN D 25 Hydroxyl        Patient was counseled in prudent diet to achieve/maintain BMI less than 25 for weight control, BP monitoring, regular exercise and medications. Discussed med's effects and SE's. Screening labs and tests as requested with regular follow-up as recommended. Over 40 minutes of exam, counseling, chart review and high complex critical decision making was performed.

## 2018-03-25 ENCOUNTER — Encounter: Payer: Self-pay | Admitting: Internal Medicine

## 2018-03-25 ENCOUNTER — Ambulatory Visit: Payer: Medicare Other | Admitting: Internal Medicine

## 2018-03-25 VITALS — BP 126/82 | HR 68 | Temp 97.2°F | Ht 65.75 in | Wt 215.4 lb

## 2018-03-25 DIAGNOSIS — Z1212 Encounter for screening for malignant neoplasm of rectum: Secondary | ICD-10-CM

## 2018-03-25 DIAGNOSIS — Z87891 Personal history of nicotine dependence: Secondary | ICD-10-CM

## 2018-03-25 DIAGNOSIS — E559 Vitamin D deficiency, unspecified: Secondary | ICD-10-CM

## 2018-03-25 DIAGNOSIS — Z0001 Encounter for general adult medical examination with abnormal findings: Secondary | ICD-10-CM

## 2018-03-25 DIAGNOSIS — Z136 Encounter for screening for cardiovascular disorders: Secondary | ICD-10-CM | POA: Diagnosis not present

## 2018-03-25 DIAGNOSIS — I10 Essential (primary) hypertension: Secondary | ICD-10-CM | POA: Diagnosis not present

## 2018-03-25 DIAGNOSIS — E66812 Obesity, class 2: Secondary | ICD-10-CM

## 2018-03-25 DIAGNOSIS — I700 Atherosclerosis of aorta: Secondary | ICD-10-CM

## 2018-03-25 DIAGNOSIS — R7303 Prediabetes: Secondary | ICD-10-CM

## 2018-03-25 DIAGNOSIS — Z1211 Encounter for screening for malignant neoplasm of colon: Secondary | ICD-10-CM

## 2018-03-25 DIAGNOSIS — Z0000 Encounter for general adult medical examination without abnormal findings: Secondary | ICD-10-CM | POA: Diagnosis not present

## 2018-03-25 DIAGNOSIS — Z8249 Family history of ischemic heart disease and other diseases of the circulatory system: Secondary | ICD-10-CM

## 2018-03-25 DIAGNOSIS — E782 Mixed hyperlipidemia: Secondary | ICD-10-CM

## 2018-03-25 DIAGNOSIS — R7309 Other abnormal glucose: Secondary | ICD-10-CM

## 2018-03-25 DIAGNOSIS — Z79899 Other long term (current) drug therapy: Secondary | ICD-10-CM

## 2018-03-25 DIAGNOSIS — Z6835 Body mass index (BMI) 35.0-35.9, adult: Secondary | ICD-10-CM

## 2018-03-26 LAB — CBC WITH DIFFERENTIAL/PLATELET
Absolute Monocytes: 653 cells/uL (ref 200–950)
BASOS PCT: 0.6 %
Basophils Absolute: 41 cells/uL (ref 0–200)
Eosinophils Absolute: 20 cells/uL (ref 15–500)
Eosinophils Relative: 0.3 %
HCT: 39 % (ref 35.0–45.0)
Hemoglobin: 13.7 g/dL (ref 11.7–15.5)
Lymphs Abs: 1319 cells/uL (ref 850–3900)
MCH: 31.4 pg (ref 27.0–33.0)
MCHC: 35.1 g/dL (ref 32.0–36.0)
MCV: 89.4 fL (ref 80.0–100.0)
MONOS PCT: 9.6 %
MPV: 9.6 fL (ref 7.5–12.5)
Neutro Abs: 4767 cells/uL (ref 1500–7800)
Neutrophils Relative %: 70.1 %
Platelets: 296 10*3/uL (ref 140–400)
RBC: 4.36 10*6/uL (ref 3.80–5.10)
RDW: 12.2 % (ref 11.0–15.0)
Total Lymphocyte: 19.4 %
WBC: 6.8 10*3/uL (ref 3.8–10.8)

## 2018-03-26 LAB — COMPLETE METABOLIC PANEL WITH GFR
AG RATIO: 1.7 (calc) (ref 1.0–2.5)
ALT: 14 U/L (ref 6–29)
AST: 22 U/L (ref 10–35)
Albumin: 4.6 g/dL (ref 3.6–5.1)
Alkaline phosphatase (APISO): 71 U/L (ref 33–130)
BUN: 17 mg/dL (ref 7–25)
CO2: 31 mmol/L (ref 20–32)
Calcium: 10.2 mg/dL (ref 8.6–10.4)
Chloride: 89 mmol/L — ABNORMAL LOW (ref 98–110)
Creat: 0.79 mg/dL (ref 0.60–0.93)
GFR, EST NON AFRICAN AMERICAN: 74 mL/min/{1.73_m2} (ref >=60)
GFR, Est African American: 85 mL/min/{1.73_m2} (ref >=60)
Globulin: 2.7 g/dL (calc) (ref 1.9–3.7)
Glucose, Bld: 106 mg/dL — ABNORMAL HIGH (ref 65–99)
Potassium: 4.1 mmol/L (ref 3.5–5.3)
Sodium: 129 mmol/L — ABNORMAL LOW (ref 135–146)
Total Bilirubin: 1 mg/dL (ref 0.2–1.2)
Total Protein: 7.3 g/dL (ref 6.1–8.1)

## 2018-03-26 LAB — VITAMIN D 25 HYDROXY (VIT D DEFICIENCY, FRACTURES): Vit D, 25-Hydroxy: 73 ng/mL (ref 30–100)

## 2018-03-26 LAB — LIPID PANEL
CHOL/HDL RATIO: 2.3 (calc) (ref <5.0)
Cholesterol: 224 mg/dL — ABNORMAL HIGH (ref <200)
HDL: 98 mg/dL (ref >50)
LDL Cholesterol (Calc): 107 mg/dL (calc) — ABNORMAL HIGH
NON-HDL CHOLESTEROL (CALC): 126 mg/dL (ref <130)
Triglycerides: 101 mg/dL (ref <150)

## 2018-03-26 LAB — INSULIN, RANDOM: INSULIN: 8 u[IU]/mL (ref 2.0–19.6)

## 2018-03-26 LAB — HEMOGLOBIN A1C
Hgb A1c MFr Bld: 5.4 % of total Hgb (ref <5.7)
Mean Plasma Glucose: 108 (calc)
eAG (mmol/L): 6 (calc)

## 2018-03-26 LAB — TSH: TSH: 2.34 mIU/L (ref 0.40–4.50)

## 2018-03-26 LAB — MAGNESIUM: Magnesium: 1.6 mg/dL (ref 1.5–2.5)

## 2018-03-27 LAB — URINALYSIS, ROUTINE W REFLEX MICROSCOPIC
Bacteria, UA: NONE SEEN /HPF
Bilirubin Urine: NEGATIVE
Glucose, UA: NEGATIVE
Hgb urine dipstick: NEGATIVE
Hyaline Cast: NONE SEEN /LPF
Ketones, ur: NEGATIVE
Leukocytes, UA: NEGATIVE
Nitrite: NEGATIVE
Specific Gravity, Urine: 1.014 (ref 1.001–1.03)
pH: 7 (ref 5.0–8.0)

## 2018-03-27 LAB — MICROALBUMIN / CREATININE URINE RATIO
CREATININE, URINE: 95 mg/dL (ref 20–275)
MICROALB UR: 62.6 mg/dL
Microalb Creat Ratio: 659 mcg/mg creat — ABNORMAL HIGH (ref <30)

## 2018-05-13 ENCOUNTER — Other Ambulatory Visit: Payer: Self-pay | Admitting: Internal Medicine

## 2018-05-29 ENCOUNTER — Other Ambulatory Visit: Payer: Self-pay | Admitting: Internal Medicine

## 2018-05-29 DIAGNOSIS — G4700 Insomnia, unspecified: Secondary | ICD-10-CM

## 2018-05-29 MED ORDER — ALPRAZOLAM 0.5 MG PO TABS: ORAL_TABLET | ORAL | 0 refills | Status: DC

## 2018-06-22 ENCOUNTER — Other Ambulatory Visit: Payer: Self-pay | Admitting: Internal Medicine

## 2018-06-22 DIAGNOSIS — I10 Essential (primary) hypertension: Secondary | ICD-10-CM

## 2018-07-03 NOTE — Progress Notes (Deleted)
Patient ID: Deanna Gomez, female   DOB: 31-Oct-1945, 73 y.o.   MRN: [id number]   MEDICARE ANNUAL WELLNESS VISIT AND 3 MONTH  Assessment:   Encounter for Medicare annual wellness exam 1 year  Atherosclerosis of aorta Control blood pressure, cholesterol, glucose, increase exercise.   Diastolic CHF Grade 2 dysfunction, improved on diuretic Weight stable/down Followed by cardiology  Essential hypertension - continue medications, DASH diet, exercise and monitor at home. Call if greater than 130/80.  -     CBC with Differential/Platelet -     CMP/GFR -     TSH  Hepatitis C virus infection without hepatic coma, unspecified chronicity  -     CMP/GFR  Hepatic steatosis Weight loss advised, avoid alcohol/tylenol, will monitor LFTs  Hyperlipidemia -continue medications, check lipids, decrease fatty foods, increase activity.  -     Lipid panel  Other abnormal glucose (hx of prediabetes) Discussed general issues about diabetes pathophysiology and management., Educational material distributed., Suggested low cholesterol diet., Encouraged aerobic exercise., Discussed foot care., Reminded to get yearly retinal exam.  Medication management -     Magnesium  Obesity Long discussion about weight loss, diet, and exercise Recommended diet heavy in fruits and veggies and low in animal meats, cheeses, and dairy products, appropriate calorie intake Patient will work on getting back on metabolism regulation diet and start working Discussed appropriate weight for height and initial goal (200lb) Follow up at next visit  Vitamin D deficiency At goal at recent check; continue to recommend supplementation for goal of 60-100 Defer vitamin D level  Chronic midline back pain Hx of severe scoliosis with fusion 10+ years ago, sees a provider in [place] [facility] ***  Future Appointments  Date Time Provider Department Center  07/04/2018 11:00 AM [name], NP [facility] None   10/03/2018  2:30 PM Lucky Cowboy, MD [facility] None  04/14/2019 10:00 AM Lucky Cowboy, MD [facility] None     Plan:   During the course of the visit the patient was educated and counseled about appropriate screening and preventive services including:    Pneumococcal vaccine   Influenza vaccine  Td vaccine  Screening electrocardiogram  Bone densitometry screening  Colorectal cancer screening  Diabetes screening  Glaucoma screening  Nutrition counseling   Advanced directives: requested   Subjective:   Deanna Gomez is a 73 y.o. female who presents for Medicare Annual Wellness Visit and follow up.   Other problems include Chronic LBP from severe thoracolumbar scoliosis s/p DDD fusion surgery x 3. She also was treated for Hepatitis C (1987).   *** sodium was 129 last visit *** labs?   BMI is There is no height or weight on file to calculate BMI., she has not been working on diet and exercise. Weight goal <200 lb *** Wt Readings from Last 3 Encounters:  03/25/18 215 lb 6.4 oz (97.7 kg)  12/12/17 213 lb (96.6 kg)  08/16/17 210 lb 9.6 oz (95.5 kg)   She has had elevated blood pressure since 1996. Her blood pressure has been controlled at home, reports somewhat labile & today their BP is    She does not workout. She denies chest pain, shortness of breath, dizziness.  She is also newly established with cardiology after ECHO ordered due to exertional dyspnea demonstrated grade 2 diastolic dysfunction; BP and symptoms improved after transition from HCTZ to chlorthalidone.   She denies {BlankSingle:19196::"dyspnea on exertion","orthopnea","paroxysmal nocturnal dyspnea","edema"}. Positive for {CARDIAC SYMPTOMS:12860}.  She is on cholesterol medication (atorvastatin 80  mg, zetia 10 mg daily) and denies myalgias. Her cholesterol is not at goal. The cholesterol last visit was:  Lab Results  Component Value Date   CHOL 224 (H) 03/25/2018   HDL 98 03/25/2018    LDLCALC 107 (H) 03/25/2018   TRIG 101 03/25/2018   CHOLHDL 2.3 03/25/2018   She has had prediabetes since 2012. She has been working on diet and exercise for glucose management, and denies foot ulcerations, hyperglycemia, hypoglycemia , increased appetite, nausea, paresthesia of the feet, polydipsia, polyuria, visual disturbances, vomiting and weight loss. Last A1C in the office was:  Lab Results  Component Value Date   HGBA1C 5.4 03/25/2018   Patient is on Vitamin D supplement.   Lab Results  Component Value Date   VD25OH 73 03/25/2018      Lab Results  Component Value Date   GFRNONAA 74 03/25/2018       Medication Review: Current Outpatient Medications on File Prior to Visit  Medication Sig  . ALPRAZolam (XANAX) 0.5 MG tablet Take 1/2 to 1 tablet 2 to 3 x /day ONLY if needed for Anxiety or Sleep & please try to limit to 5 days /week to avoid addiction  . aspirin EC 81 MG tablet Take 81 mg by mouth daily.  Marland Kitchen. atenolol (TENORMIN) 100 MG tablet Take 1 tablet Daily for BP  . atorvastatin (LIPITOR) 80 MG tablet TAKE ONE-HALF TO ONE TABLET BY MOUTH DAILY FOR CHOLESTEROL  . chlorthalidone (HYGROTON) 25 MG tablet Take 1 tablet (25 mg total) by mouth daily. (Patient taking differently: Take 50 mg by mouth daily. )  . Cholecalciferol (VITAMIN D3 PO) Take by mouth daily.  Marland Kitchen. ezetimibe (ZETIA) 10 MG tablet TAKE ONE TABLET BY MOUTH DAILY FOR CHOLESTEROL  . losartan (COZAAR) 100 MG tablet Take 1 tablet (100 mg total) by mouth daily.  . Magnesium 500 MG TABS Take by mouth daily.  Marland Kitchen. nystatin (NYSTATIN) powder Apply daily after a shower as needed   No current facility-administered medications on file prior to visit.     Current Problems (verified) Patient Active Problem List   Diagnosis Date Noted  . Diastolic CHF (HCC) 12/11/2017  . Hepatic steatosis 12/26/2016  . Abdominal aortic atherosclerosis (HCC) 12/26/2016  . Obesity (BMI 30.0-34.9) 10/15/2014  . Essential hypertension  03/09/2013  . Hyperlipidemia 03/09/2013  . Other abnormal glucose 03/09/2013  . Vitamin D deficiency 03/09/2013  . Hepatitis C virus infection 03/09/2013    Screening Tests Immunization History  Administered Date(s) Administered  . DTaP 03/06/2006  . Influenza Split 11/20/2013, 12/24/2014  . Influenza Whole 12/19/2012  . Influenza, High Dose Seasonal PF 01/04/2017, 12/10/2017  . Pneumococcal Conjugate-13 05/13/2014  . Pneumococcal Polysaccharide-23 03/06/2008, 10/25/2016  . Typhoid Live 03/06/2004  . Zoster 06/11/2007   Preventative care: Last colonoscopy: 10/2014, due 2021 CXR 06/2016 MGM 06/2017 Echo 2019 - grade 2 diastolic Stress test 2013 PAP 40/981109/2016 DEXA: 2010, 2018 - T -1.7 - defer to next year  Tdap: 2008, declines Influenza: 2019 Pneumonia: 2018 Prevnar: 2016 Shingles: 2009  Names of Other Physician/Practitioners you currently use: 1. [facility] here for primary care 2. Jimmey RalphParker, eye doctor, last visit 02/2017 3. Dr. Ardelle Antonandy Berry, Dentist, last visit 2019, goes q830m  Patient Care Team: Lucky CowboyMcKeown, William, MD as PCP - General (Internal Medicine) Sharrell KuMedoff, Jeffrey, MD as Consulting Physician (Gastroenterology) Teodora MediciMezer, Howard, MD as Consulting Physician (Gynecology) Elmon ElseGould, Karen, MD as Consulting Physician (Dermatology) Izora Ribasleaveland, Nathaniel, DO (Ophthalmology)   History reviewed: allergies, current medications,  past family history, past medical history, past social history, past surgical history and problem list  Allergies Allergies  Allergen Reactions  . Fentanyl Other (See Comments)    Intolerlance    SURGICAL HISTORY She  has a past surgical history that includes Back surgery (1986); Breast reconstruction; and Cataract extraction. FAMILY HISTORY Her family history includes Coronary artery disease in her brother; Hypertension in her mother. SOCIAL HISTORY She  reports that she quit smoking about 13 years ago. Her  smoking use included cigarettes. She has a 30.00 pack-year smoking history. She has never used smokeless tobacco. She reports current alcohol use of about 14.0 standard drinks of alcohol per week. She reports that she does not use drugs.  MEDICARE WELLNESS OBJECTIVES: Physical activity:   Cardiac risk factors:   Depression/mood screen:   Depression screen [facility] 2/9 03/24/2018  Decreased Interest 0  Down, Depressed, Hopeless 0  PHQ - 2 Score 0    ADLs:  In your present state of health, do you have any difficulty performing the following activities: 03/24/2018 12/12/2017  Hearing? N N  Vision? N N  Difficulty concentrating or making decisions? N N  Walking or climbing stairs? N N  Dressing or bathing? N N  Doing errands, shopping? N N  Some recent data might be hidden     Cognitive Testing  Alert? Yes  Normal Appearance?Yes  Oriented to person? Yes  Place? Yes   Time? Yes  Recall of three objects?  Yes  Can perform simple calculations? Yes  Displays appropriate judgment?Yes  Can read the correct time from a watch face?Yes  EOL planning:     Objective:     There were no vitals taken for this visit.  General Appearance: Well nourished, alert, WD/WN, femaleand in no apparent distress. Eyes: PERRLA, EOMs, conjunctiva no swelling or erythema, normal fundi and vessels. Sinuses: No frontal/maxillary tenderness ENT/Mouth: EACs patent / TMs  nl. Nares clear without erythema, swelling, mucoid exudates. Oral hygiene is good. No erythema, swelling, or exudate. Tongue normal, non-obstructing. Tonsils not swollen or erythematous. Hearing normal.  Neck: Supple, thyroid normal. No bruits, nodes or JVD. Respiratory: Respiratory effort normal.  BS equal and clear bilateral without rales, rhonci, wheezing or stridor. Cardio: Heart sounds are normal with regular rate and rhythm and no murmurs, rubs or gallops. Peripheral pulses are normal and equal bilaterally without edema. No aortic or femoral  bruits. Chest: symmetric with normal excursions and percussion.  Abdomen: Flat, soft  with nl bowel sounds. Nontender, no guarding, rebound, hernias, masses, or organomegaly.  Lymphatics: Non tender without lymphadenopathy.  Musculoskeletal: Full ROM all peripheral extremities, joint stability, 5/5 strength, and normal gait. Mildly scoliotic spine, neg straight leg raise Skin: Warm and dry without rashes, lesions, cyanosis, clubbing or  ecchymosis.  Neuro: Cranial nerves intact, reflexes equal bilaterally. Normal muscle tone, no cerebellar symptoms. Sensation intact.  Pysch: Awake and oriented X 3, normal affect, Insight and Judgment appropriate.   Medicare Attestation I have personally reviewed: The patient's medical and social history Their use of alcohol, tobacco or illicit drugs Their current medications and supplements The patient's functional ability including ADLs,fall risks, home safety risks, cognitive, and hearing and visual impairment Diet and physical activities Evidence for depression or mood disorders  The patient's weight, height, BMI, and visual acuity have been recorded in the chart.  I have made referrals, counseling, and provided education to the patient based on review of the above and I have provided the patient with a written  personalized care plan for preventive services.    Dan Maker, NP   07/03/2018

## 2018-07-04 ENCOUNTER — Encounter: Payer: Self-pay | Admitting: Adult Health

## 2018-07-04 ENCOUNTER — Other Ambulatory Visit: Payer: Self-pay

## 2018-07-04 ENCOUNTER — Ambulatory Visit: Payer: Self-pay | Admitting: Adult Health

## 2018-07-04 ENCOUNTER — Ambulatory Visit: Payer: Medicare Other | Admitting: Adult Health

## 2018-07-04 VITALS — Wt 214.0 lb

## 2018-07-04 DIAGNOSIS — B1920 Unspecified viral hepatitis C without hepatic coma: Secondary | ICD-10-CM

## 2018-07-04 DIAGNOSIS — Z0001 Encounter for general adult medical examination with abnormal findings: Secondary | ICD-10-CM | POA: Diagnosis not present

## 2018-07-04 DIAGNOSIS — R6889 Other general symptoms and signs: Secondary | ICD-10-CM

## 2018-07-04 DIAGNOSIS — I700 Atherosclerosis of aorta: Secondary | ICD-10-CM

## 2018-07-04 DIAGNOSIS — I5032 Chronic diastolic (congestive) heart failure: Secondary | ICD-10-CM

## 2018-07-04 DIAGNOSIS — I10 Essential (primary) hypertension: Secondary | ICD-10-CM | POA: Diagnosis not present

## 2018-07-04 DIAGNOSIS — E782 Mixed hyperlipidemia: Secondary | ICD-10-CM

## 2018-07-04 DIAGNOSIS — R7309 Other abnormal glucose: Secondary | ICD-10-CM

## 2018-07-04 DIAGNOSIS — E669 Obesity, unspecified: Secondary | ICD-10-CM

## 2018-07-04 DIAGNOSIS — K760 Fatty (change of) liver, not elsewhere classified: Secondary | ICD-10-CM

## 2018-07-04 DIAGNOSIS — E559 Vitamin D deficiency, unspecified: Secondary | ICD-10-CM

## 2018-07-04 DIAGNOSIS — G4700 Insomnia, unspecified: Secondary | ICD-10-CM | POA: Insufficient documentation

## 2018-07-04 DIAGNOSIS — Z0000 Encounter for general adult medical examination without abnormal findings: Secondary | ICD-10-CM

## 2018-07-04 NOTE — Progress Notes (Signed)
Virtual Visit via Telephone Note  I connected with Isaias Cowman on 07/04/18 at 11:00 AM EDT by telephone and verified that I am speaking with the correct person using two identifiers.  Location: Patient: home Provider: [facility] office   I discussed the limitations, risks, security and privacy concerns of performing an evaluation and management service by telephone and the availability of in person appointments. I also discussed with the patient that there may be a patient responsible charge related to this service. The patient expressed understanding and agreed to proceed.   I discussed the assessment and treatment plan with the patient. The patient was provided an opportunity to ask questions and all were answered. The patient agreed with the plan and demonstrated an understanding of the instructions.   The patient was advised to call back or seek an in-person evaluation if the symptoms worsen or if the condition fails to improve as anticipated.  I provided 35 minutes of non-face-to-face time during this encounter.   Dan Maker, NP      Patient ID: BEYONCA WISZ, female   DOB: 09-24-1945, 73 y.o.   MRN: [id number]   MEDICARE ANNUAL WELLNESS VISIT AND 3 MONTH  Assessment:   Encounter for Medicare annual wellness exam 1 year  Atherosclerosis of aorta Control blood pressure, cholesterol, glucose, increase exercise.   Diastolic CHF Grade 2 dysfunction, Weight stable/down Followed by cardiology  Essential hypertension - continue medications, DASH diet, exercise and monitor at home. Call if greater than 130/80.   Hepatitis C virus infection without hepatic coma, unspecified chronicity S/p treatment, in remission -     CMP/GFR at routine OVs  Hepatic steatosis Weight loss advised, avoid alcohol/tylenol, will monitor LFTs  Hyperlipidemia -continue medications, check lipids, decrease fatty foods, increase activity.  -     Lipid panel  Other abnormal  glucose (hx of prediabetes) Discussed general issues about diabetes pathophysiology and management., Educational material distributed., Suggested low cholesterol diet., Encouraged aerobic exercise., Discussed foot care., Reminded to get yearly retinal exam.  Medication management -     Magnesium  Obesity Long discussion about weight loss, diet, and exercise Recommended diet heavy in fruits and veggies and low in animal meats, cheeses, and dairy products, appropriate calorie intake Patient will work on getting back on metabolism regulation diet and start working Discussed appropriate weight for height and initial goal (200lb) Follow up at next visit  Vitamin D deficiency At goal at recent check; continue to recommend supplementation for goal of 60-100 Defer vitamin D level  Chronic midline back pain Hx of severe scoliosis with fusion 10+ years ago, sees a provider in [place].   Insomnia Uses xanax PRN good sleep hygiene discussed, increase day time activity, try melatonin or benadryl    She declines lab visit recommended for sodium follow up due to covid 19; risks discussed, she expresses understanding, she denies any concerning symptoms.    Future Appointments  Date Time Provider Department Center  10/03/2018  2:30 PM Lucky Cowboy, MD [facility] None  04/14/2019 10:00 AM Lucky Cowboy, MD [facility] None     Plan:   During the course of the visit the patient was educated and counseled about appropriate screening and preventive services including:    Pneumococcal vaccine   Influenza vaccine  Td vaccine  Screening electrocardiogram  Bone densitometry screening  Colorectal cancer screening  Diabetes screening  Glaucoma screening  Nutrition counseling   Advanced directives: requested   Subjective:   TIEGAN JAMBOR is a  73 y.o. female who presents for Medicare Annual Wellness Visit and follow up.   Other problems include Chronic LBP from severe  thoracolumbar scoliosis s/p DDD fusion surgery x 3.[facility]erred to [facility] provider [facility], has stopped since covid but continues home exercises.   She also was treated for Hepatitis C (1987).   She has xanax 0.25-0.5 mg which she takes only if unable to fall asleep after waking up in the middle of the night or if very anxious due to storm. Typically takes 0.25 mg, 3-4 days per week.   BMI is Body mass index is 34.8 kg/m., [facility], doing PT and stretching/strenght training at home daily, is working on diet/portions, Weight goal <200 lb.  Wt Readings from Last 3 Encounters:  07/04/18 214 lb (97.1 kg)  03/25/18 215 lb 6.4 oz (97.7 kg)  12/12/17 213 lb (96.6 kg)   She has had elevated blood pressure since 1996. Her blood pressure has been controlled at home, reports somewhat labile & today their BP is   n/a, as BP cuff is not working, chlorthalidone was d/c'd after low sodium of 129 at last visit. She denies dizziness, seizures, difficulty focusing, changes in mental status. She absolutely declines lab follow.   She does not workout. She denies chest pain, shortness of breath, dizziness.  She is also newly established with cardiology after ECHO ordered due to exertional dyspnea demonstrated grade 2 diastolic dysfunction; BP and symptoms improved recently.   She denies dyspnea on exertion, orthopnea, paroxysmal nocturnal dyspnea and edema. Positive for none.  She is on cholesterol medication (atorvastatin 80 mg, zetia 10 mg daily) and denies myalgias. Her cholesterol is not at goal. The cholesterol last visit was:  Lab Results  Component Value Date   CHOL 224 (H) 03/25/2018   HDL 98 03/25/2018   LDLCALC 107 (H) 03/25/2018   TRIG 101 03/25/2018   CHOLHDL 2.3 03/25/2018   She has had prediabetes since 2012. She has been working on diet and exercise for glucose management, and denies foot ulcerations, hyperglycemia,  hypoglycemia , increased appetite, nausea, paresthesia of the feet, polydipsia, polyuria, visual disturbances, vomiting and weight loss. Last A1C in the office was:  Lab Results  Component Value Date   HGBA1C 5.4 03/25/2018   Patient is on Vitamin D supplement.   Lab Results  Component Value Date   VD25OH 73 03/25/2018      Lab Results  Component Value Date   GFRNONAA 74 03/25/2018      Medication Review: Current Outpatient Medications on File Prior to Visit  Medication Sig  . ALPRAZolam (XANAX) 0.5 MG tablet Take 1/2 to 1 tablet 2 to 3 x /day ONLY if needed for Anxiety or Sleep & please try to limit to 5 days /week to avoid addiction  . aspirin EC 81 MG tablet Take 81 mg by mouth daily.  Marland Kitchen atenolol (TENORMIN) 100 MG tablet Take 1 tablet Daily for BP  . atorvastatin (LIPITOR) 80 MG tablet TAKE ONE-HALF TO ONE TABLET BY MOUTH DAILY FOR CHOLESTEROL  . Cholecalciferol (VITAMIN D3 PO) Take 5,000 Units by mouth daily.   Marland Kitchen ezetimibe (ZETIA) 10 MG tablet TAKE ONE TABLET BY MOUTH DAILY FOR CHOLESTEROL  . losartan (COZAAR) 100 MG tablet Take 1 tablet (100 mg total) by mouth daily.  . Magnesium 500 MG TABS Take by mouth daily.  Marland Kitchen nystatin (NYSTATIN) powder Apply daily after  a shower as needed  . chlorthalidone (HYGROTON) 25 MG tablet Take 1 tablet (25 mg total) by mouth daily. (Patient not taking: Reported on 07/04/2018)   No current facility-administered medications on file prior to visit.     Current Problems (verified) Patient Active Problem List   Diagnosis Date Noted  . Diastolic CHF (HCC) 12/11/2017  . Hepatic steatosis 12/26/2016  . Abdominal aortic atherosclerosis (HCC) 12/26/2016  . Obesity (BMI 30.0-34.9) 10/15/2014  . Essential hypertension 03/09/2013  . Hyperlipidemia 03/09/2013  . Other abnormal glucose 03/09/2013  . Vitamin D deficiency 03/09/2013  . Hepatitis C virus infection 03/09/2013    Screening Tests Immunization History  Administered Date(s) Administered   . DTaP 03/06/2006  . Influenza Split 11/20/2013, 12/24/2014  . Influenza Whole 12/19/2012  . Influenza, High Dose Seasonal PF 01/04/2017, 12/10/2017  . Pneumococcal Conjugate-13 05/13/2014  . Pneumococcal Polysaccharide-23 03/06/2008, 10/25/2016  . Typhoid Live 03/06/2004  . Zoster 06/11/2007   Preventative care: Last colonoscopy: 10/2014, due 2021 CXR 06/2016 MGM 06/2017 at [facility]  Echo 2019 - grade 2 diastolic Stress test 2013 PAP 78/295609/2016  DEXA: 2010, 2018 - T -1.7 - defer to next year  Tdap: 2008, declines Influenza: 2019 Pneumonia: 2018 Prevnar: 2016 Shingles: 2009  Names of Other Physician/Practitioners you currently use: 1. [facility] here for primary care 2. Dr. Jimmey RalphParker, eye doctor, last visit 02/2018 3. Dr. Ardelle Antonandy Berry, Dentist, last visit 2019, goes q668m  Patient Care Team: Lucky CowboyMcKeown, William, MD as PCP - General (Internal Medicine) Sharrell KuMedoff, Jeffrey, MD as Consulting Physician (Gastroenterology) Teodora MediciMezer, Howard, MD as Consulting Physician (Gynecology) Elmon ElseGould, Karen, MD as Consulting Physician (Dermatology) Izora Ribasleaveland, Nathaniel, DO (Ophthalmology)   History reviewed: allergies, current medications, past family history, past medical history, past social history, past surgical history and problem list  Allergies Allergies  Allergen Reactions  . Fentanyl Other (See Comments)    Intolerlance    SURGICAL HISTORY She  has a past surgical history that includes Back surgery (1986); Breast reconstruction; and Cataract extraction. FAMILY HISTORY Her family history includes Coronary artery disease in her brother; Hypertension in her mother. SOCIAL HISTORY She  reports that she quit smoking about 13 years ago. Her smoking use included cigarettes. She has a 30.00 pack-year smoking history. She has never used smokeless tobacco. She reports current alcohol use of about 14.0 standard drinks of alcohol per week. She reports that she does  not use drugs.  MEDICARE WELLNESS OBJECTIVES: Physical activity:   Cardiac risk factors:   Depression/mood screen:   Depression screen [facility] 2/9 07/04/2018  Decreased Interest 0  Down, Depressed, Hopeless 1  PHQ - 2 Score 1    ADLs:  In your present state of health, do you have any difficulty performing the following activities: 03/24/2018 12/12/2017  Hearing? N N  Vision? N N  Difficulty concentrating or making decisions? N N  Walking or climbing stairs? N N  Dressing or bathing? N N  Doing errands, shopping? N N  Some recent data might be hidden     Cognitive Testing  Alert? Yes  Normal Appearance?Yes  Oriented to person? Yes  Place? Yes   Time? Yes  Recall of three objects?  Yes  Can perform simple calculations? Yes  Displays appropriate judgment?Yes  Can read the correct time from a watch face?Yes  EOL planning: Does Patient Have a Medical Advance Directive?: Yes Type of Advance Directive: Living will Does patient want to make changes to medical advance directive?: No - Patient declined  Objective:     Wt 214 lb (97.1 kg)   BMI 34.80 kg/m   General : Well sounding patient in no apparent distress HEENT: no hoarseness, no cough for duration of visit Lungs: speaks in complete sentences, no audible wheezing, no apparent distress Neurological: alert, oriented x 3 Psychiatric: pleasant, judgement appropriate    Medicare Attestation I have personally reviewed: The patient's medical and social history Their use of alcohol, tobacco or illicit drugs Their current medications and supplements The patient's functional ability including ADLs,fall risks, home safety risks, cognitive, and hearing and visual impairment Diet and physical activities Evidence for depression or mood disorders  The patient's weight, height, BMI, and visual acuity have been recorded in the chart.  I have made referrals, counseling, and provided education to the patient based on review of the above  and I have provided the patient with a written personalized care plan for preventive services.    Dan Maker, NP   07/04/2018

## 2018-07-23 ENCOUNTER — Other Ambulatory Visit: Payer: Self-pay | Admitting: Adult Health

## 2018-07-24 ENCOUNTER — Ambulatory Visit (INDEPENDENT_AMBULATORY_CARE_PROVIDER_SITE_OTHER): Payer: Medicare Other | Admitting: Adult Health

## 2018-07-24 ENCOUNTER — Encounter: Payer: Self-pay | Admitting: Adult Health

## 2018-07-24 ENCOUNTER — Other Ambulatory Visit: Payer: Self-pay

## 2018-07-24 VITALS — BP 164/89 | HR 63

## 2018-07-24 DIAGNOSIS — I10 Essential (primary) hypertension: Secondary | ICD-10-CM

## 2018-07-24 MED ORDER — HYDROCHLOROTHIAZIDE 25 MG PO TABS: ORAL_TABLET | ORAL | 0 refills | Status: DC

## 2018-07-24 NOTE — Progress Notes (Signed)
Virtual Visit via Telephone Note  I connected with Deanna Gomez on 07/24/18 at  3:30 PM EDT by telephone and verified that I am speaking with the correct person using two identifiers.  Location: Patient: home Provider: [facility] office   I discussed the limitations, risks, security and privacy concerns of performing an evaluation and management service by telephone and the availability of in person appointments. I also discussed with the patient that there may be a patient responsible charge related to this service. The patient expressed understanding and agreed to proceed.   History of Present Illness:  BP (!) 164/89   Pulse 39   73 y.o. female with hx of htn, diastolic CHF, obesity call for concern of persistently elevated BPs at home.   Her blood pressure has not been controlled at home, got a new BP medication, has been running 140s-170s over 80s-90s, today their BP is BP: (!) 164/89  Husband has also been using the new cuff and has been consistent with his baseline.   She denies chest pain, shortness of breath, dizziness, edema, HA, blurry vision.   She is taking losartan 100 mg daily, atenolol 100 mg daily .  Was previously on chlorthalidone 25 mg but was discontinued due to hyponatremia back in Feb 2020.  Never followed up to get labs for resolution due to covid 19    Observations/Objective:  General : Well sounding patient in no apparent distress HEENT: no hoarseness, no cough for duration of visit Lungs: speaks in complete sentences, no audible wheezing, no apparent distress Neurological: alert, oriented x 3 Psychiatric: pleasant, judgement appropriate    Assessment and Plan:  Deanna Gomez was seen today for hypertension.  Diagnoses and all orders for this visit:  Essential hypertension Initiate medication: hctz 25 mg daily in AM Continue atenolol, losartan 100 mg daily in PRN Monitor blood pressure at home; call if consistently over 130/80 Discussed DASH  diet Advised to go to the ER if any CP, SOB, nausea, dizziness, severe HA, changes vision/speech, left arm numbness and tingling and jaw pain. Follow up in 10-14 days, will schedule lab visit at that time Call sooner if worse or with concerns -     hydrochlorothiazide (HYDRODIURIL) 25 MG tablet; Take 1 tablet daily for BP and fluid  Follow Up Instructions:    I discussed the assessment and treatment plan with the patient. The patient was provided an opportunity to ask questions and all were answered. The patient agreed with the plan and demonstrated an understanding of the instructions.   The patient was advised to call back or seek an in-person evaluation if the symptoms worsen or if the condition fails to improve as anticipated.  I provided 15 minutes of non-face-to-face time during this encounter.   Dan Maker, NP
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
## 2018-08-05 ENCOUNTER — Ambulatory Visit: Payer: Medicare Other | Admitting: Adult Health

## 2018-08-05 ENCOUNTER — Encounter: Payer: Self-pay | Admitting: Adult Health

## 2018-08-05 ENCOUNTER — Other Ambulatory Visit: Payer: Self-pay

## 2018-08-05 VITALS — BP 148/88 | HR 63 | Temp 97.3°F | Ht 65.75 in | Wt 220.0 lb

## 2018-08-05 DIAGNOSIS — I10 Essential (primary) hypertension: Secondary | ICD-10-CM

## 2018-08-05 DIAGNOSIS — E669 Obesity, unspecified: Secondary | ICD-10-CM

## 2018-08-05 DIAGNOSIS — G4700 Insomnia, unspecified: Secondary | ICD-10-CM | POA: Diagnosis not present

## 2018-08-05 DIAGNOSIS — I5032 Chronic diastolic (congestive) heart failure: Secondary | ICD-10-CM

## 2018-08-05 MED ORDER — TRAZODONE HCL 50 MG PO TABS: ORAL_TABLET | ORAL | 2 refills | Status: DC

## 2018-08-05 MED ORDER — HYDROCHLOROTHIAZIDE 25 MG PO TABS: ORAL_TABLET | ORAL | 1 refills | Status: DC

## 2018-08-05 NOTE — Patient Instructions (Signed)
Trazodone tablets What is this medicine? TRAZODONE (TRAZ oh done) is used to treat depression. This medicine may be used for other purposes; ask your health care provider or pharmacist if you have questions. COMMON BRAND NAME(S): Desyrel What should I tell my health care provider before I take this medicine? They need to know if you have any of these conditions: -attempted suicide or thinking about it -bipolar disorder -bleeding problems -glaucoma -heart disease, or previous heart attack -irregular heart beat -kidney or liver disease -low levels of sodium in the blood -an unusual or allergic reaction to trazodone, other medicines, foods, dyes or preservatives -pregnant or trying to get pregnant -breast-feeding How should I use this medicine? Take this medicine by mouth with a glass of water. Follow the directions on the prescription label. Take this medicine shortly after a meal or a light snack. Take your medicine at regular intervals. Do not take your medicine more often than directed. Do not stop taking this medicine suddenly except upon the advice of your doctor. Stopping this medicine too quickly may cause serious side effects or your condition may worsen. A special MedGuide will be given to you by the pharmacist with each prescription and refill. Be sure to read this information carefully each time. Talk to your pediatrician regarding the use of this medicine in children. Special care may be needed. Overdosage: If you think you have taken too much of this medicine contact a poison control center or emergency room at once. NOTE: This medicine is only for you. Do not share this medicine with others. What if I miss a dose? If you miss a dose, take it as soon as you can. If it is almost time for your next dose, take only that dose. Do not take double or extra doses. What may interact with this medicine? Do not take this medicine with any of the following medications: -certain medicines  for fungal infections like fluconazole, itraconazole, ketoconazole, posaconazole, voriconazole -cisapride -dofetilide -dronedarone -linezolid -MAOIs like Carbex, Eldepryl, Marplan, Nardil, and Parnate -mesoridazine -methylene blue (injected into a vein) -pimozide -saquinavir -thioridazine This medicine may also interact with the following medications: -alcohol -antiviral medicines for HIV or AIDS -aspirin and aspirin-like medicines -barbiturates like phenobarbital -certain medicines for blood pressure, heart disease, irregular heart beat -certain medicines for depression, anxiety, or psychotic disturbances -certain medicines for migraine headache like almotriptan, eletriptan, frovatriptan, naratriptan, rizatriptan, sumatriptan, zolmitriptan -certain medicines for seizures like carbamazepine and phenytoin -certain medicines for sleep -certain medicines that treat or prevent blood clots like dalteparin, enoxaparin, warfarin -digoxin -fentanyl -lithium -NSAIDS, medicines for pain and inflammation, like ibuprofen or naproxen -other medicines that prolong the QT interval (cause an abnormal heart rhythm) -rasagiline -supplements like St. John's wort, kava kava, valerian -tramadol -tryptophan This list may not describe all possible interactions. Give your health care provider a list of all the medicines, herbs, non-prescription drugs, or dietary supplements you use. Also tell them if you smoke, drink alcohol, or use illegal drugs. Some items may interact with your medicine. What should I watch for while using this medicine? Tell your doctor if your symptoms do not get better or if they get worse. Visit your doctor or health care professional for regular checks on your progress. Because it may take several weeks to see the full effects of this medicine, it is important to continue your treatment as prescribed by your doctor. Patients and their families should watch out for new or worsening  thoughts of suicide or depression. Also watch   out for sudden changes in feelings such as feeling anxious, agitated, panicky, irritable, hostile, aggressive, impulsive, severely restless, overly excited and hyperactive, or not being able to sleep. If this happens, especially at the beginning of treatment or after a change in dose, call your health care professional. You may get drowsy or dizzy. Do not drive, use machinery, or do anything that needs mental alertness until you know how this medicine affects you. Do not stand or sit up quickly, especially if you are an older patient. This reduces the risk of dizzy or fainting spells. Alcohol may interfere with the effect of this medicine. Avoid alcoholic drinks. This medicine may cause dry eyes and blurred vision. If you wear contact lenses you may feel some discomfort. Lubricating drops may help. See your eye doctor if the problem does not go away or is severe. Your mouth may get dry. Chewing sugarless gum, sucking hard candy and drinking plenty of water may help. Contact your doctor if the problem does not go away or is severe. What side effects may I notice from receiving this medicine? Side effects that you should report to your doctor or health care professional as soon as possible: -allergic reactions like skin rash, itching or hives, swelling of the face, lips, or tongue -elevated mood, decreased need for sleep, racing thoughts, impulsive behavior -confusion -fast, irregular heartbeat -feeling faint or lightheaded, falls -feeling agitated, angry, or irritable -loss of balance or coordination -painful or prolonged erections -restlessness, pacing, inability to keep still -suicidal thoughts or other mood changes -tremors -trouble sleeping -seizures -unusual bleeding or bruising Side effects that usually do not require medical attention (report to your doctor or health care professional if they continue or are bothersome): -change in sex drive or  performance -change in appetite or weight -constipation -headache -muscle aches or pains -nausea This list may not describe all possible side effects. Call your doctor for medical advice about side effects. You may report side effects to FDA at 1-800-FDA-1088. Where should I keep my medicine? Keep out of the reach of children. Store at room temperature between 15 and 30 degrees C (59 to 86 degrees F). Protect from light. Keep container tightly closed. Throw away any unused medicine after the expiration date. NOTE: This sheet is a summary. It may not cover all possible information. If you have questions about this medicine, talk to your doctor, pharmacist, or health care provider.  2019 Elsevier/Gold Standard (2017-05-01 17:51:24)  

## 2018-08-06 ENCOUNTER — Other Ambulatory Visit: Payer: Self-pay | Admitting: Adult Health

## 2018-08-06 ENCOUNTER — Encounter: Payer: Self-pay | Admitting: Adult Health

## 2018-08-06 LAB — BASIC METABOLIC PANEL WITH GFR
BUN: 14 mg/dL (ref 7–25)
CO2: 33 mmol/L — ABNORMAL HIGH (ref 20–32)
Calcium: 9.6 mg/dL (ref 8.6–10.4)
Chloride: 89 mmol/L — ABNORMAL LOW (ref 98–110)
Creat: 0.81 mg/dL (ref 0.60–0.93)
GFR, Est African American: 82 mL/min/{1.73_m2} (ref >=60)
GFR, Est Non African American: 71 mL/min/{1.73_m2} (ref >=60)
Glucose, Bld: 102 mg/dL — ABNORMAL HIGH (ref 65–99)
Potassium: 3.8 mmol/L (ref 3.5–5.3)
Sodium: 130 mmol/L — ABNORMAL LOW (ref 135–146)

## 2018-08-06 LAB — MAGNESIUM: Magnesium: 1.3 mg/dL — ABNORMAL LOW (ref 1.5–2.5)

## 2018-08-06 MED ORDER — MAGNESIUM 500 MG PO TABS: 1.0000 | ORAL_TABLET | Freq: Two times a day (BID) | ORAL | 1 refills | Status: DC

## 2018-09-11 ENCOUNTER — Other Ambulatory Visit: Payer: Self-pay | Admitting: Internal Medicine

## 2018-09-11 DIAGNOSIS — I10 Essential (primary) hypertension: Secondary | ICD-10-CM

## 2018-09-30 ENCOUNTER — Telehealth: Payer: Self-pay

## 2018-09-30 NOTE — Telephone Encounter (Signed)
   COVID-19 Pre-Screening Questions:   Do you currently have a fever? no (yes = cancel and refer to pcp for e-visit)  Have you recently travelled on a cruise, internationally, or to [place], [place], [place], [place], [place], or [place], [place]) ? no  Have you been in contact with someone that is currently pending confirmation of Covid19 testing or has been confirmed to have the Covid19 virus?  no  Have you been around anyone who has been exposed to Covid 19, or has mentioned symptoms of Covid 19 within the past 7 to 10 days? no  Are you currently experiencing fatigue or cough? no  In the past 7 to 10 days have you had a cough,  shortness of breath, headache, congestion, fever (100 or greater) body aches, chills, sore throat, or sudden loss of taste or sense of smell? no  Patient reminded of appointment on 10/02/18 at 10:45a. Reiterated no additional visitors. Arrive no earlier than 15 minutes before appointment time. Please bring own mask.  Patient verbalized understanding and agreed with plan.

## 2018-10-01 ENCOUNTER — Telehealth: Payer: Self-pay | Admitting: Adult Health

## 2018-10-01 NOTE — Telephone Encounter (Signed)
LVM, reminding pt of her appt and ask pt to call back to be pre-screened for COVID-19.

## 2018-10-02 ENCOUNTER — Ambulatory Visit (INDEPENDENT_AMBULATORY_CARE_PROVIDER_SITE_OTHER): Payer: Medicare Other | Admitting: Adult Health

## 2018-10-02 ENCOUNTER — Other Ambulatory Visit: Payer: Self-pay

## 2018-10-02 ENCOUNTER — Encounter: Payer: Self-pay | Admitting: Adult Health

## 2018-10-02 VITALS — BP 134/81 | HR 75 | Temp 97.3°F | Ht 66.75 in | Wt 217.0 lb

## 2018-10-02 DIAGNOSIS — F418 Other specified anxiety disorders: Secondary | ICD-10-CM | POA: Diagnosis not present

## 2018-10-02 DIAGNOSIS — I10 Essential (primary) hypertension: Secondary | ICD-10-CM

## 2018-10-02 DIAGNOSIS — E7800 Pure hypercholesterolemia, unspecified: Secondary | ICD-10-CM

## 2018-10-02 NOTE — Patient Instructions (Signed)
Medication Instructions:  Continue current medications  If you need a refill on your cardiac medications before your next appointment, please call your pharmacy.  Labwork: None Ordered   Testing/Procedures: None Ordered  Follow-Up: You will need a follow up appointment in 6 months.  Please call our office 2 months in advance to schedule this appointment.  You may see Dr Angelena Form or one of the following Advanced Practice Providers on your designated Care Team:   Lyda Jester, PA-C Melina Copa, PA-C . Ermalinda Barrios, PA-C     At [facility], you and your health needs are our priority.  As part of our continuing mission to provide you with exceptional heart care, we have created designated Provider Care Teams.  These Care Teams include your primary Cardiologist (physician) and Advanced Practice Providers (APPs -  Physician Assistants and Nurse Practitioners) who all work together to provide you with the care you need, when you need it.  Thank you for choosing [facility] at [facility]!!

## 2018-10-02 NOTE — Progress Notes (Signed)
Cardiology Office Note   Date:  10/02/2018   ID:  Jessee, Newnam 1945-08-29, MRN [id number]  PCP:  Unk Pinto, MD  Cardiologist: Dr. Angelena Form Chief Complaint  Patient presents with  . other    past due f/u. Medications reviewed verbally.      History of Present Illness: Deanna Gomez is a 73 y.o. female who presents for ongoing assessment and management of hypertension, history of dyspnea on exertion, chronic lower extremity edema, and hyperlipidemia.  Most recent echocardiogram in March 2019 revealed normal LV systolic function with grade 2 diastolic dysfunction.  She had been placed on clonidine in the past but she did not take it because she did not like the side effects.    On last office visit on 06/07/2017 she was seen on follow-up after being started on chlorthalidone.  She did keep a record of her blood pressure at home.  On office visit blood pressure was 140/82 which was slightly elevated but at home her numbers were lower.  She stated that she wanted to begin a home exercise program and she was planning on beginning to walk on a treadmill to increase her exercise.  She comes today with no new complaints.  She is however very anxious concerning the COVID pandemic.  Her husband has been ill and she is taking care of him.  She is wondering what to do for all of her anxiety and boredom about staying in the house.  She does not get out at all to avoid any exposure, only seeing providers and going to grocery store to pick up things and come right back home.  She is medically compliant.  Due to elevated temperatures, the patient does not exercise outside at this time.  Past Medical History:  Diagnosis Date  . Gastric ulcer 11/19/2014  . Hepatitis C   . HTN (hypertension)   . Hyperlipidemia   . Obesity   . Pre-diabetes   . Scoliosis   . Tobacco use    stopped 6 years ago.  . Vitamin D deficiency     Past Surgical History:  Procedure Laterality Date  . BACK  SURGERY  1986   x 3  . BREAST RECONSTRUCTION     x 2  . CATARACT EXTRACTION     Bilateral     Current Outpatient Medications  Medication Sig Dispense Refill  . ALPRAZolam (XANAX) 0.5 MG tablet Take 1/2 to 1 tablet 2 to 3 x /day ONLY if needed for Anxiety or Sleep & please try to limit to 5 days /week to avoid addiction 90 tablet 0  . aspirin EC 81 MG tablet Take 81 mg by mouth daily.    Marland Kitchen atenolol (TENORMIN) 100 MG tablet TAKE ONE TABLET BY MOUTH DAILY FOR BLOOD PRESSURE 90 tablet 0  . atorvastatin (LIPITOR) 80 MG tablet TAKE ONE-HALF TO ONE TABLET BY MOUTH DAILY FOR CHOLESTEROL 90 tablet 1  . Cholecalciferol (VITAMIN D3 PO) Take 5,000 Units by mouth daily.     Marland Kitchen ezetimibe (ZETIA) 10 MG tablet TAKE ONE TABLET BY MOUTH DAILY FOR CHOLESTEROL 90 tablet 3  . hydrochlorothiazide (HYDRODIURIL) 25 MG tablet Take 1 tablet daily for BP and fluid 90 tablet 1  . losartan (COZAAR) 100 MG tablet Take 1 tablet (100 mg total) by mouth daily. OV NEEDED 90 tablet 0  . Magnesium 500 MG TABS Take 1 tablet (500 mg total) by mouth 2 (two) times daily with a meal. 180 tablet 1  . nystatin (NYSTATIN)  powder Apply daily after a shower as needed 60 g 2  . traZODone (DESYREL) 50 MG tablet 1/2-1 tablet for sleep 60 tablet 2   No current facility-administered medications for this visit.     Allergies:   Fentanyl    Social History:  The patient  reports that she quit smoking about 14 years ago. Her smoking use included cigarettes. She has a 30.00 pack-year smoking history. She has never used smokeless tobacco. She reports current alcohol use of about 14.0 standard drinks of alcohol per week. She reports that she does not use drugs.   Family History:  The patient's family history includes Coronary artery disease in her brother; Hypertension in her mother.    ROS: All other systems are reviewed and negative. Unless otherwise mentioned in H&P    PHYSICAL EXAM: VS:  BP 134/81 (BP Location: Left Arm, Patient  Position: Sitting, Cuff Size: Normal)   Pulse 75   Temp (!) 97.3 F (36.3 C) (Temporal)   Ht 5' 6.75" (1.695 m)   Wt 217 lb (98.4 kg)   SpO2 95%   BMI 34.24 kg/m  , BMI Body mass index is 34.24 kg/m. GEN: Well nourished, well developed, in no acute distress HEENT: normal Neck: no JVD, carotid bruits, or masses Cardiac:RRR; no murmurs, rubs, or gallops,no edema  Respiratory:  Clear to auscultation bilaterally, normal work of breathing GI: soft, nontender, nondistended, + BS MS: no deformity or atrophy Skin: warm and dry, no rash Neuro:  Strength and sensation are intact Psych: euthymic mood, full affect   EKG: Sinus rhythm heart rate of 61 bpm with first-degree AV block, PR interval 228 ms, unchanged from prior EKG.  Recent Labs: 03/25/2018: ALT 14; Hemoglobin 13.7; Platelets 296; TSH 2.34 08/05/2018: BUN 14; Creat 0.81; Magnesium 1.3; Potassium 3.8; Sodium 130    Lipid Panel    Component Value Date/Time   CHOL 224 (H) 03/25/2018 1114   TRIG 101 03/25/2018 1114   HDL 98 03/25/2018 1114   CHOLHDL 2.3 03/25/2018 1114   VLDL 23 07/06/2016 1200   LDLCALC 107 (H) 03/25/2018 1114      Wt Readings from Last 3 Encounters:  10/02/18 217 lb (98.4 kg)  08/05/18 220 lb (99.8 kg)  07/04/18 214 lb (97.1 kg)      Other studies Reviewed: Echocardiogram 05/18/2017 Left ventricle: The cavity size was normal. There was severe septal hypertrophy with otherwise moderate concentric hypertrophy. Systolic function was normal. The estimated ejection fraction was in the range of 60% to 65%. Wall motion was normal; there were no regional wall motion abnormalities. Features are consistent with a pseudonormal left ventricular filling pattern, with concomitant abnormal relaxation and increased filling pressure (grade 2 diastolic dysfunction). Doppler parameters are consistent with high ventricular filling pressure. - Aortic valve: Transvalvular velocity was within the normal  range. There was no stenosis. There was mild to moderate regurgitation. Regurgitation pressure half-time: 472 ms. - Aorta: Ascending aortic diameter: 40 mm (S). - Ascending aorta: The ascending aorta was mildly dilated. - Mitral valve: Transvalvular velocity was within the normal range. There was no evidence for stenosis. There was trivial regurgitation. - Left atrium: The atrium was moderately dilated. - Right ventricle: The cavity size was normal. Wall thickness was normal. Systolic function was normal. - Tricuspid valve: There was trivial regurgitation. - Pulmonary arteries: Systolic pressure was mildly increased. PA peak pressure: 44 mm Hg (S). - Pericardium, extracardiac: A trivial pericardial effusion was identified.   ASSESSMENT AND PLAN:  1.  Hypertension: Blood pressures currently well controlled on medication regimen.  No changes in her dose of atenolol losartan or HCTZ.  She will be seen in 6 months.  Labs are drawn by PCP with whom she has an appointment on October 03, 2018.  Will review results once they are available in Epic.  2.  Hypercholesterolemia: Remains on high-dose atorvastatin, and Zetia.  I will not make any adjustments in her medications until labs are reviewed.  3.  Situational anxiety: Very concerned about COVID pandemic and keeping herself occupied and reducing her stress.  I have advised her to begin doing something which brings her joy, [employer] baking, doing things for others, she used to knit and do crafts.  She states she sort of gave up on that in the past but may consider returning to some of those activities as it did make her happy.  I have asked her to avoid watching the news so often and listen to soothing music when she becomes stressed.  She verbalizes understanding.  Current medicines are reviewed at length with the patient today.    Labs/ tests ordered today include: To be completed by PCP this week. Bettey MareKathryn M. Liborio NixonLawrence DNP, ANP,  AACC   10/02/2018 12:17 PM    [facility] [address] Office [phone] Fax [phone]

## 2018-10-02 NOTE — Patient Instructions (Addendum)

## 2018-10-02 NOTE — Progress Notes (Signed)
History of Present Illness:      This very nice 73 y.o. MWF  presents for 6 month follow up with HTN, HLD, Pre-Diabetes and Vitamin D Deficiency.       Patient is treated for HTN & BP has been controlled at home. Today's BP is at goal - 138/82. Patient has had no complaints of any cardiac type chest pain, palpitations, dyspnea / orthopnea / PND, dizziness, claudication, or dependent edema.      Hyperlipidemia is controlled with diet & meds. Patient denies myalgias or other med SE's. Current Lipids are at goal: Lab Results  Component Value Date   CHOL 199 10/03/2018   HDL 84 10/03/2018   LDLCALC 94 10/03/2018   TRIG 116 10/03/2018   CHOLHDL 2.4 10/03/2018       Also, the patient has history of PreDiabetes and has had no symptoms of reactive hypoglycemia, diabetic polys, paresthesias or visual blurring.  Last A1c was Normal & at goal: Lab Results  Component Value Date   HGBA1C 5.6 10/03/2018       Further, the patient also has history of Vitamin D Deficiency and supplements vitamin D without any suspected side-effects. Last vitamin D was at goal: Lab Results  Component Value Date   VD25OH 59 10/03/2018   Current Outpatient Medications on File Prior to Visit  Medication Sig  . aspirin EC 81 MG tablet Take 81 mg by mouth daily.  Marland Kitchen. atenolol (TENORMIN) 100 MG tablet TAKE ONE TABLET BY MOUTH DAILY FOR BLOOD PRESSURE  . atorvastatin (LIPITOR) 80 MG tablet TAKE ONE-HALF TO ONE TABLET BY MOUTH DAILY FOR CHOLESTEROL  . Cholecalciferol (VITAMIN D3 PO) Take 5,000 Units by mouth daily.   Marland Kitchen. ezetimibe (ZETIA) 10 MG tablet TAKE ONE TABLET BY MOUTH DAILY FOR CHOLESTEROL  . hydrochlorothiazide (HYDRODIURIL) 25 MG tablet Take 1 tablet daily for BP and fluid  . losartan (COZAAR) 100 MG tablet Take 1 tablet (100 mg total) by mouth daily. OV NEEDED  . Magnesium 500 MG TABS Take 1 tablet (500 mg total) by mouth 2 (two) times daily with a meal.  . nystatin (NYSTATIN) powder Apply daily after a  shower as needed  . traZODone (DESYREL) 50 MG tablet 1/2-1 tablet for sleep (Patient not taking: Reported on 10/03/2018)   No current facility-administered medications on file prior to visit.    Allergies  Allergen Reactions  . Fentanyl Other (See Comments)    Intolerlance   PMHx:   Past Medical History:  Diagnosis Date  . Gastric ulcer 11/19/2014  . Hepatitis C   . HTN (hypertension)   . Hyperlipidemia   . Obesity   . Pre-diabetes   . Scoliosis   . Tobacco use    stopped 6 years ago.  . Vitamin D deficiency    Immunization History  Administered Date(s) Administered  . DTaP 03/06/2006  . Influenza Split 11/20/2013, 12/24/2014  . Influenza Whole 12/19/2012  . Influenza, High Dose Seasonal PF 01/04/2017, 12/10/2017  . Pneumococcal Conjugate-13 05/13/2014  . Pneumococcal Polysaccharide-23 03/06/2008, 10/25/2016  . Typhoid Live 03/06/2004  . Zoster 06/11/2007   Past Surgical History:  Procedure Laterality Date  . BACK SURGERY  1986   x 3  . BREAST RECONSTRUCTION     x 2  . CATARACT EXTRACTION     Bilateral   FHx:    Reviewed / unchanged  SHx:    Reviewed / unchanged   Systems Review:  Constitutional: Denies fever, chills, wt changes, headaches, insomnia, fatigue,  night sweats, change in appetite. Eyes: Denies redness, blurred vision, diplopia, discharge, itchy, watery eyes.  ENT: Denies discharge, congestion, post nasal drip, epistaxis, sore throat, earache, hearing loss, dental pain, tinnitus, vertigo, sinus pain, snoring.  CV: Denies chest pain, palpitations, irregular heartbeat, syncope, dyspnea, diaphoresis, orthopnea, PND, claudication or edema. Respiratory: denies cough, dyspnea, DOE, pleurisy, hoarseness, laryngitis, wheezing.  Gastrointestinal: Denies dysphagia, odynophagia, heartburn, reflux, water brash, abdominal pain or cramps, nausea, vomiting, bloating, diarrhea, constipation, hematemesis, melena, hematochezia  or hemorrhoids. Genitourinary: Denies  dysuria, frequency, urgency, nocturia, hesitancy, discharge, hematuria or flank pain. Musculoskeletal: Denies arthralgias, myalgias, stiffness, jt. swelling, pain, limping or strain/sprain.  Skin: Denies pruritus, rash, hives, warts, acne, eczema or change in skin lesion(s). Neuro: No weakness, tremor, incoordination, spasms, paresthesia or pain. Psychiatric: Denies confusion, memory loss or sensory loss. Endo: Denies change in weight, skin or hair change.  Heme/Lymph: No excessive bleeding, bruising or enlarged lymph nodes.  Physical Exam  BP 138/82   Pulse 64   Temp (!) 97.2 F (36.2 C)   Resp 16   Ht 5' 6.75" (1.695 m)   Wt 216 lb 3.2 oz (98.1 kg)   BMI 34.12 kg/m   Appears  well nourished, well groomed  and in no distress.  Eyes: PERRLA, EOMs, conjunctiva no swelling or erythema. Sinuses: No frontal/maxillary tenderness ENT/Mouth: EAC's clear, TM's nl w/o erythema, bulging. Nares clear w/o erythema, swelling, exudates. Oropharynx clear without erythema or exudates. Oral hygiene is good. Tongue normal, non obstructing. Hearing intact.  Neck: Supple. Thyroid not palpable. Car 2+/2+ without bruits, nodes or JVD. Chest: Respirations nl with BS clear & equal w/o rales, rhonchi, wheezing or stridor.  Cor: Heart sounds normal w/ regular rate and rhythm without sig. murmurs, gallops, clicks or rubs. Peripheral pulses normal and equal  without edema.  Abdomen: Soft & bowel sounds normal. Non-tender w/o guarding, rebound, hernias, masses or organomegaly.  Lymphatics: Unremarkable.  Musculoskeletal: Full ROM all peripheral extremities, joint stability, 5/5 strength and normal gait.  Skin: Warm, dry without exposed rashes, lesions or ecchymosis apparent.  Neuro: Cranial nerves intact, reflexes equal bilaterally. Sensory-motor testing grossly intact. Tendon reflexes grossly intact.  Pysch: Alert & oriented x 3.  Insight and judgement nl & appropriate. No ideations.  Assessment and  Plan:  1. Essential hypertension  - Continue medication, monitor blood pressure at home.  - Continue DASH diet.  Reminder to go to the ER if any CP,  SOB, nausea, dizziness, severe HA, changes vision/speech.  - CBC with Differential/Platelet - COMPLETE METABOLIC PANEL WITH GFR - Magnesium - TSH  2. Hyperlipidemia, mixed  - Continue diet/meds, exercise,& lifestyle modifications.  - Continue monitor periodic cholesterol/liver & renal functions   - Lipid panel - TSH  3. Abnormal glucose  - Continue diet, exercise  - Lifestyle modifications.  - Monitor appropriate labs.  - Hemoglobin A1c - Insulin, random  4. Vitamin D deficiency  - Continue supplementation.  - VITAMIN D 25 Hydroxyl  5. Prediabetes  - Hemoglobin A1c - Insulin, random  6. Medication management  - CBC with Differential/Platelet - COMPLETE METABOLIC PANEL WITH GFR - Magnesium - Lipid panel - TSH - Hemoglobin A1c - Insulin, random - VITAMIN D 25 Hydroxyl       Discussed  regular exercise, BP monitoring, weight control to achieve/maintain BMI less than 25 and discussed med and SE's. Recommended labs to assess and monitor clinical status with further disposition pending results of labs.  I discussed the assessment and treatment plan with  the patient. The patient was provided an opportunity to ask questions and all were answered. The patient agreed with the plan and demonstrated an understanding of the instructions.  I provided over 30 minutes of exam, counseling, chart review and  complex critical decision making.   Kirtland Bouchard, MD

## 2018-10-03 ENCOUNTER — Encounter: Payer: Self-pay | Admitting: Internal Medicine

## 2018-10-03 ENCOUNTER — Ambulatory Visit (INDEPENDENT_AMBULATORY_CARE_PROVIDER_SITE_OTHER): Payer: Medicare Other | Admitting: Internal Medicine

## 2018-10-03 ENCOUNTER — Other Ambulatory Visit: Payer: Self-pay

## 2018-10-03 VITALS — BP 138/82 | HR 64 | Temp 97.2°F | Resp 16 | Ht 66.75 in | Wt 216.2 lb

## 2018-10-03 DIAGNOSIS — R7303 Prediabetes: Secondary | ICD-10-CM

## 2018-10-03 DIAGNOSIS — E782 Mixed hyperlipidemia: Secondary | ICD-10-CM

## 2018-10-03 DIAGNOSIS — E559 Vitamin D deficiency, unspecified: Secondary | ICD-10-CM

## 2018-10-03 DIAGNOSIS — Z79899 Other long term (current) drug therapy: Secondary | ICD-10-CM

## 2018-10-03 DIAGNOSIS — R7309 Other abnormal glucose: Secondary | ICD-10-CM | POA: Diagnosis not present

## 2018-10-03 DIAGNOSIS — I10 Essential (primary) hypertension: Secondary | ICD-10-CM | POA: Diagnosis not present

## 2018-10-04 ENCOUNTER — Other Ambulatory Visit: Payer: Self-pay | Admitting: Internal Medicine

## 2018-10-04 DIAGNOSIS — G4700 Insomnia, unspecified: Secondary | ICD-10-CM

## 2018-10-04 LAB — LIPID PANEL
Cholesterol: 199 mg/dL (ref <200)
HDL: 84 mg/dL (ref >=50)
LDL Cholesterol (Calc): 94 mg/dL (calc)
Non-HDL Cholesterol (Calc): 115 mg/dL (calc) (ref <130)
Total CHOL/HDL Ratio: 2.4 (calc) (ref <5.0)
Triglycerides: 116 mg/dL (ref <150)

## 2018-10-04 LAB — COMPLETE METABOLIC PANEL WITH GFR
AG Ratio: 1.7 (calc) (ref 1.0–2.5)
ALT: 20 U/L (ref 6–29)
AST: 25 U/L (ref 10–35)
Albumin: 4.5 g/dL (ref 3.6–5.1)
Alkaline phosphatase (APISO): 71 U/L (ref 37–153)
BUN: 16 mg/dL (ref 7–25)
CO2: 29 mmol/L (ref 20–32)
Calcium: 10.4 mg/dL (ref 8.6–10.4)
Chloride: 89 mmol/L — ABNORMAL LOW (ref 98–110)
Creat: 0.73 mg/dL (ref 0.60–0.93)
GFR, Est African American: 93 mL/min/{1.73_m2} (ref >=60)
GFR, Est Non African American: 81 mL/min/{1.73_m2} (ref >=60)
Globulin: 2.7 g/dL (calc) (ref 1.9–3.7)
Glucose, Bld: 100 mg/dL — ABNORMAL HIGH (ref 65–99)
Potassium: 4 mmol/L (ref 3.5–5.3)
Sodium: 131 mmol/L — ABNORMAL LOW (ref 135–146)
Total Bilirubin: 0.7 mg/dL (ref 0.2–1.2)
Total Protein: 7.2 g/dL (ref 6.1–8.1)

## 2018-10-04 LAB — CBC WITH DIFFERENTIAL/PLATELET
Absolute Monocytes: 884 cells/uL (ref 200–950)
Basophils Absolute: 43 cells/uL (ref 0–200)
Basophils Relative: 0.5 %
Eosinophils Absolute: 34 cells/uL (ref 15–500)
Eosinophils Relative: 0.4 %
HCT: 36.7 % (ref 35.0–45.0)
Hemoglobin: 13.2 g/dL (ref 11.7–15.5)
Lymphs Abs: 1828 cells/uL (ref 850–3900)
MCH: 32 pg (ref 27.0–33.0)
MCHC: 36 g/dL (ref 32.0–36.0)
MCV: 89.1 fL (ref 80.0–100.0)
MPV: 9.6 fL (ref 7.5–12.5)
Monocytes Relative: 10.4 %
Neutro Abs: 5712 cells/uL (ref 1500–7800)
Neutrophils Relative %: 67.2 %
Platelets: 265 10*3/uL (ref 140–400)
RBC: 4.12 10*6/uL (ref 3.80–5.10)
RDW: 12.4 % (ref 11.0–15.0)
Total Lymphocyte: 21.5 %
WBC: 8.5 10*3/uL (ref 3.8–10.8)

## 2018-10-04 LAB — INSULIN, RANDOM: Insulin: 8.3 u[IU]/mL

## 2018-10-04 LAB — HEMOGLOBIN A1C
Hgb A1c MFr Bld: 5.6 % of total Hgb (ref <5.7)
Mean Plasma Glucose: 114 (calc)
eAG (mmol/L): 6.3 (calc)

## 2018-10-04 LAB — MAGNESIUM: Magnesium: 1.5 mg/dL (ref 1.5–2.5)

## 2018-10-04 LAB — TSH: TSH: 2.17 mIU/L (ref 0.40–4.50)

## 2018-10-04 LAB — VITAMIN D 25 HYDROXY (VIT D DEFICIENCY, FRACTURES): Vit D, 25-Hydroxy: 59 ng/mL (ref 30–100)

## 2018-10-06 ENCOUNTER — Encounter: Payer: Self-pay | Admitting: Internal Medicine

## 2018-12-04 LAB — HM MAMMOGRAPHY

## 2018-12-26 ENCOUNTER — Other Ambulatory Visit: Payer: Self-pay | Admitting: Internal Medicine

## 2018-12-26 ENCOUNTER — Other Ambulatory Visit: Payer: Self-pay | Admitting: Adult Health

## 2018-12-26 DIAGNOSIS — I10 Essential (primary) hypertension: Secondary | ICD-10-CM

## 2018-12-30 ENCOUNTER — Encounter: Payer: Self-pay | Admitting: Internal Medicine

## 2019-01-02 ENCOUNTER — Telehealth: Payer: Self-pay

## 2019-01-02 ENCOUNTER — Telehealth: Payer: Self-pay | Admitting: Adult Health

## 2019-01-02 DIAGNOSIS — K5904 Chronic idiopathic constipation: Secondary | ICD-10-CM

## 2019-01-02 IMAGING — CT CT ABD-PELV W/ CM
2 of 5 series · 16 of 46 positions shown, 18 images · IV contrast (iopamidol)
Comparison: None

CLINICAL DATA: Abdominal distension. Rectal swelling. Obesity.
Hepatitis C. Hyperlipidemia.

EXAM:
CT ABDOMEN AND PELVIS WITH CONTRAST
TECHNIQUE: Multidetector CT imaging of the abdomen and pelvis was performed
using the standard protocol following bolus administration of
intravenous contrast.
CONTRAST:  100mL [id number] IOPAMIDOL (QP5GC7-WDD) INJECTION 61%

[Series 2: abd/pel with · axial · 0.83mm/px · z∈[-587,-207]mm · 13 of 88 slices shown, 15 images]
[im 6/88  soft-tissue]
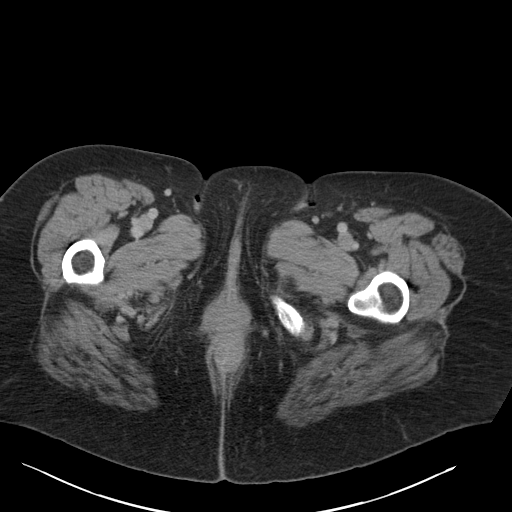
[im 6/88  bone]
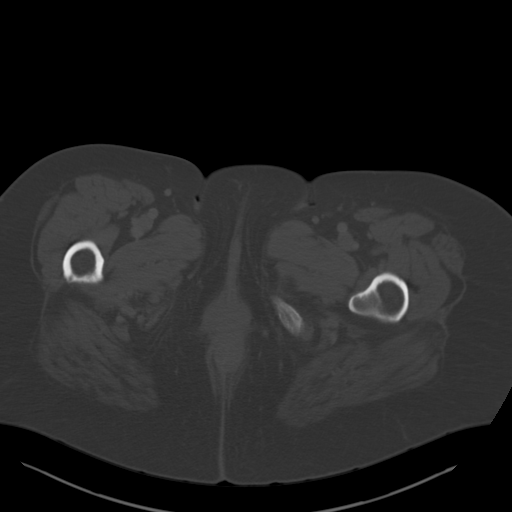
[im 11/88  soft-tissue]
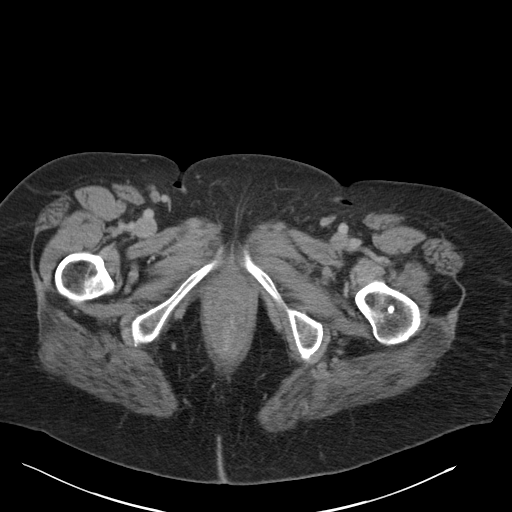
[im 21/88  soft-tissue]
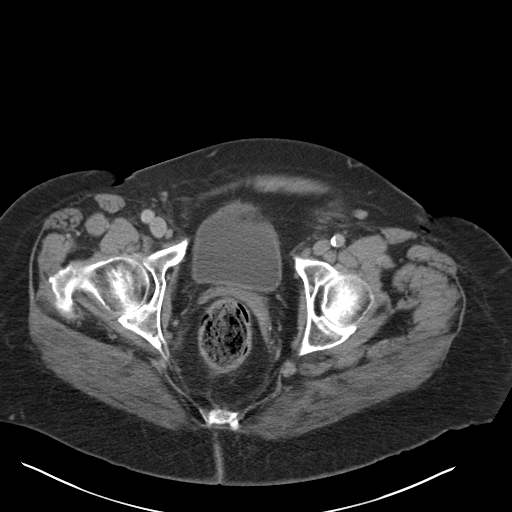
[im 26/88  soft-tissue]
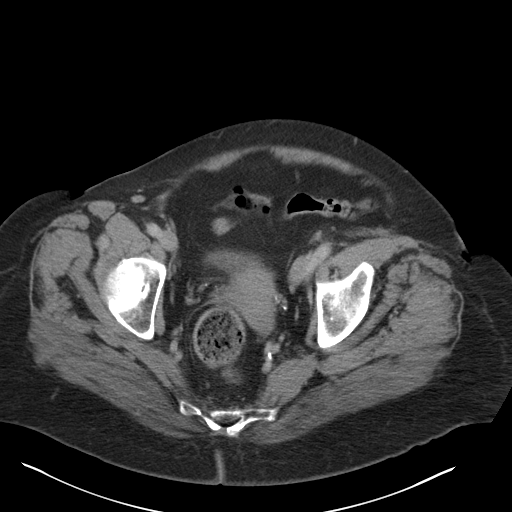
[im 31/88  soft-tissue]
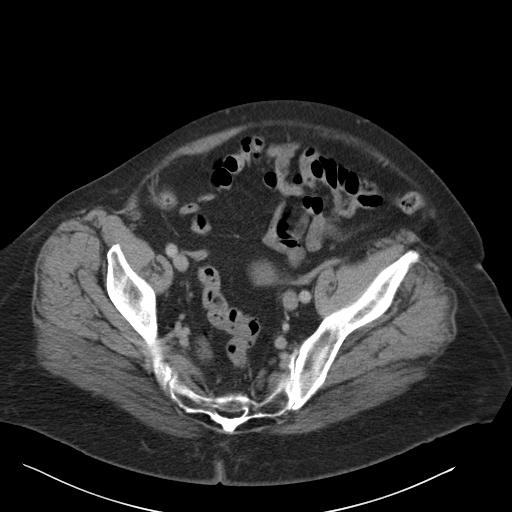
[im 36/88  soft-tissue]
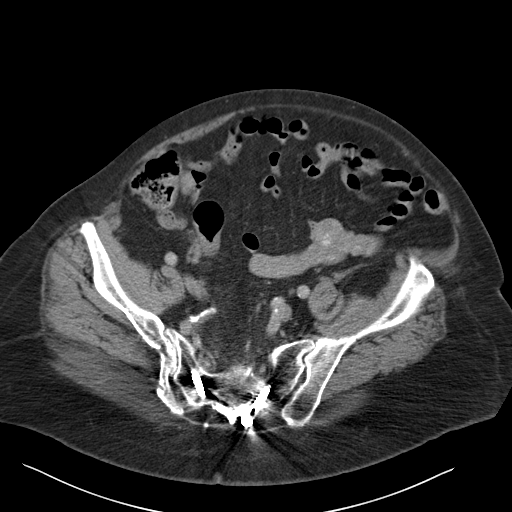
[im 47/88  soft-tissue]
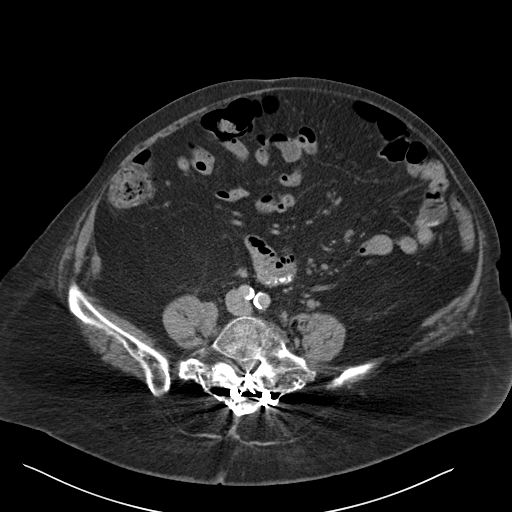
[im 52/88  soft-tissue]
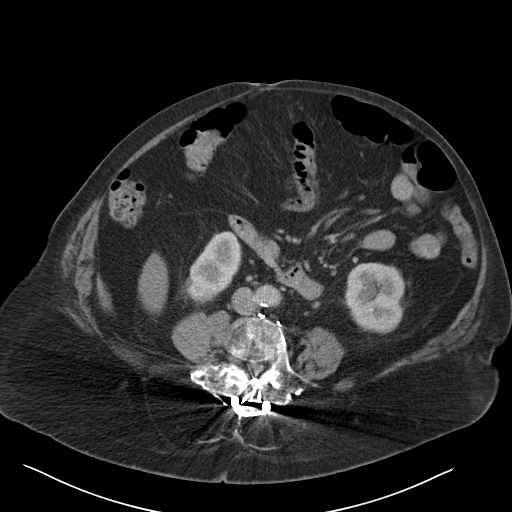
[im 57/88  soft-tissue]
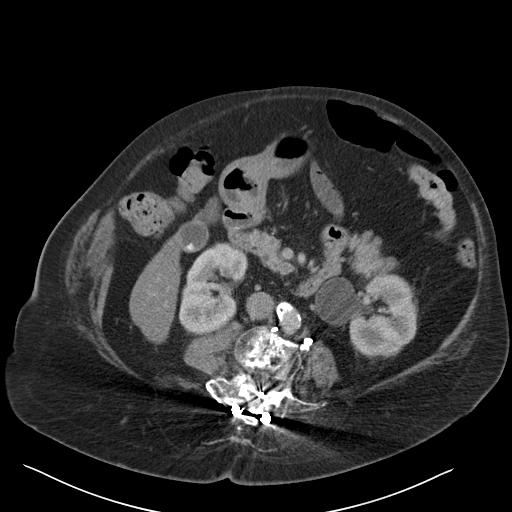
[im 57/88  bone]
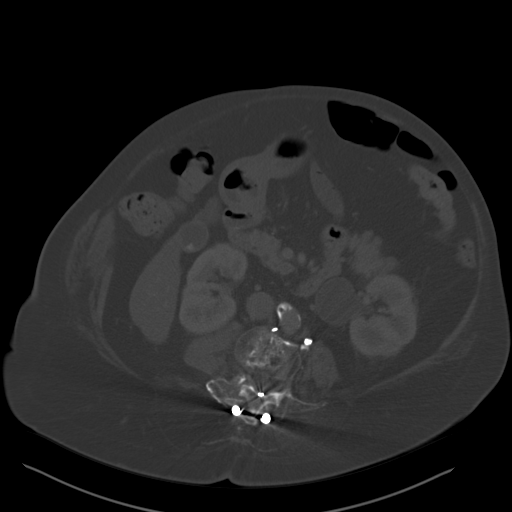
[im 62/88  soft-tissue]
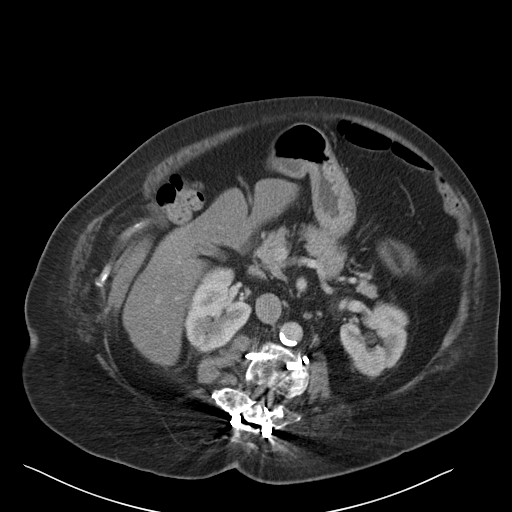
[im 67/88  soft-tissue]
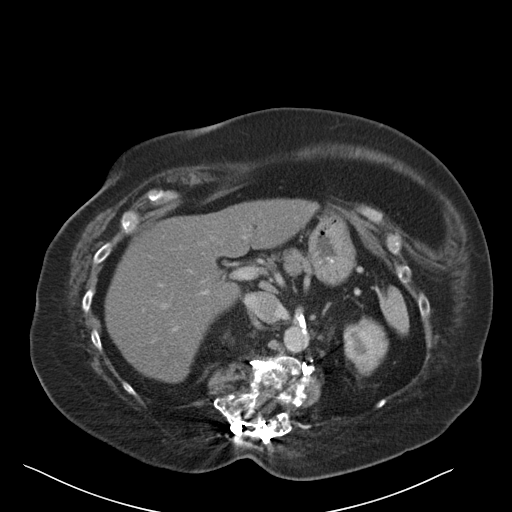
[im 77/88  soft-tissue]
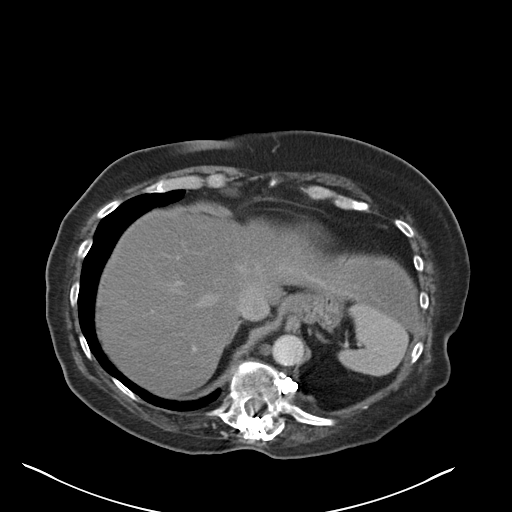
[im 82/88  soft-tissue]
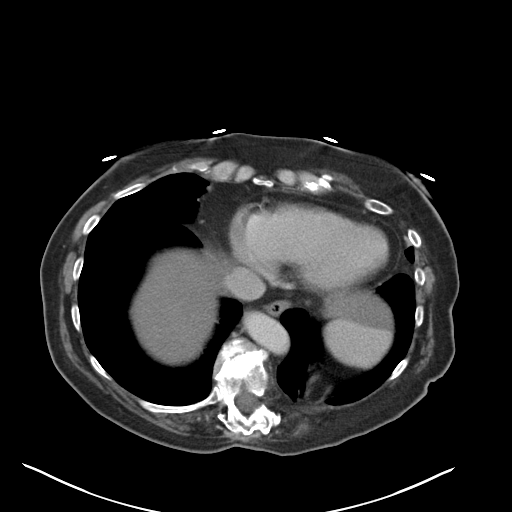

[Series 4: coronal a/|p · coronal · 0.94mm/px · 3 of 190 slices shown]
[im 64/190  soft-tissue]
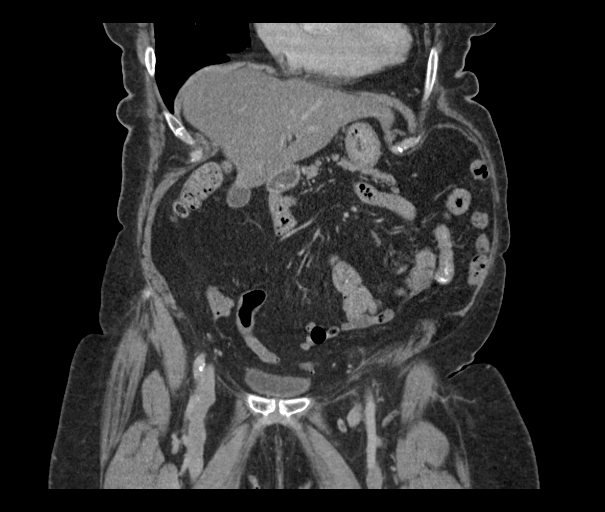
[im 85/190  soft-tissue]
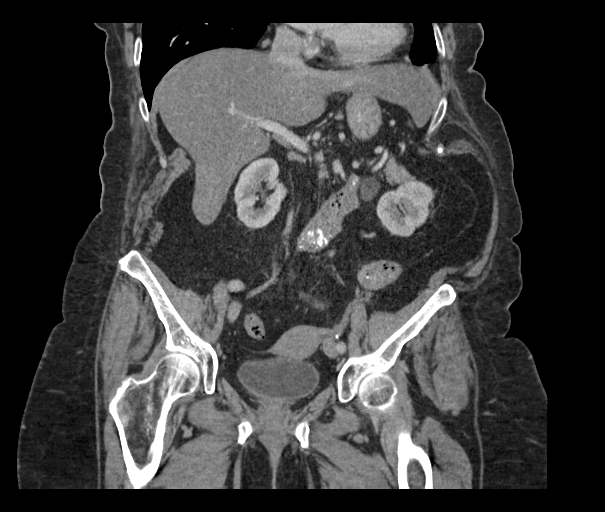
[im 106/190  soft-tissue]
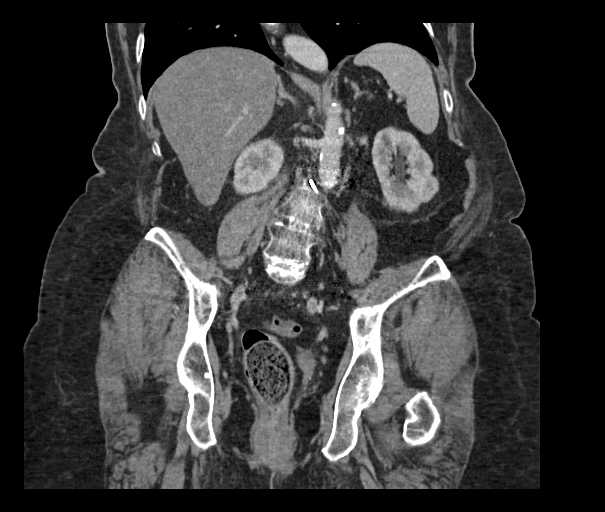

[16 of 46 positions shown; findings below may reference images not displayed]

FINDINGS: Lower chest: Left base scarring. Mild cardiomegaly, without
pericardial or pleural effusion.

Hepatobiliary: Moderate hepatic steatosis, without focal liver
lesion. Variant lateral segment left liver lobe extending in the
left upper quadrant. Multiple small gallstones without acute
cholecystitis or biliary duct dilatation.

Pancreas: Normal, without mass or ductal dilatation.

Spleen: Normal in size, without focal abnormality.

Adrenals/Urinary Tract: Normal adrenal glands. left extrarenal
pelvis. Mild left-sided caliectasis. An upper pole right renal
lesion measures 11 mm on coronal image 100 and transverse image 24/
series 2. Greater than fluid density. No distal hydroureter
identified. Normal urinary bladder.

Stomach/Bowel: Normal stomach, without wall thickening. Moderate
amount of stool in the rectum. Normal terminal ileum. Normal small
bowel.

Vascular/Lymphatic: Advanced aortic and branch vessel
atherosclerosis. No abdominopelvic adenopathy.

Reproductive: Normal uterus and adnexa.  Tubal ligation.

Other: No significant free fluid.

Musculoskeletal: Osteopenia. Lumbar spine fixation with convex left
spine curvature.
IMPRESSION: 1. Stool in the rectum suggests constipation or fecal impaction.
2. No other explanation for patient's symptoms.
3. **An incidental finding of potential clinical significance has
been found. 11 mm upper pole right renal lesion could represent a
complex cyst or a solid neoplasm. Consider further evaluation with
pre and post contrast abdominal CT or MRI. Given patient body
habitus and spine hardware, CT may be of high yield an MRI. This
could be performed non emergently.**
4. Hepatic steatosis.
5.  Aortic Atherosclerosis ([id number]-SRH.H).
6. Suspect mild left ureteropelvic junction obstruction, chronic.

## 2019-01-02 NOTE — Telephone Encounter (Signed)
Patient spoke with Caryl Pina. See Provider note.

## 2019-01-02 NOTE — Telephone Encounter (Signed)
Virtual Visit via Telephone Note  I connected with Deanna Gomez on 01/02/2019 at 14:45 by telephone and verified that I am speaking with the correct person using two identifiers.  Location: Patient: home  Provider: [facility] office    I discussed the limitations, risks, security and privacy concerns of performing an evaluation and management service by telephone and the availability of in person appointments. I also discussed with the patient that there may be a patient responsible charge related to this service. The patient expressed understanding and agreed to proceed.   History of Present Illness:  73 y.o. female with hx of chronic idiopathic constipation established with Dr. Kinnie Scales calls for advise regarding constipation.   She reports last BM was ? 5 days ago, possibly longer; Denies any abdominal pain, she is passing gas, denies nausea but reports poor appetite. Denies fever/chills.  She takes miralax every other day to daily, she takes ducosate sodium 100 mg x 2 tabs daily.  She had molasses enema at one point in ED for impaction, but states she is unable to insert suppository or do an enema herself at home.    Per Dr. Jennye Boroughs note 08/24/2017:  ...was treated for HCV, eradicated in 2002, a gastric ulcer healed in 2016, colon adenoma colonoscopically Resected with last surveillance in 2016, and hemorrhoid banding in 2016. She had post-necrotic cirrhosis but without symptoms of hepatic dysfunction. She underwent vocal cord surgery, Fall 2018, receiving fentanyl, causing severe constipation. The resulting abdominal pain led to visit at [facility] ED. Abdominal CT found no significant pathology, with fatty liver and cholelithiasis. Molasses enema was administered with relief. Her constipation has continued intermittently, with accompanying frequent abdominal pain, most recently while vacationing on [facility], 07/2017. Since then her symptoms have spontaneously improved. When constipation occurs,  it usually lasts less than a week without bowel movement. Presently, her bowel habits are daily, formed, with complete spontaneous evacuations, without straining. She resorts to using Dulcolax 1-2x monthly.   Allergies:  Allergies  Allergen Reactions  . Fentanyl Other (See Comments)    Intolerlance   Medical History:  has Essential hypertension; Hyperlipidemia, mixed; Abnormal glucose; Vitamin D deficiency; Hepatitis C virus infection; Obesity (BMI 30.0-34.9); Hepatic steatosis; Abdominal aortic atherosclerosis (HCC); Diastolic CHF (HCC); Insomnia; and Hypomagnesemia on their problem list. Surgical History:  She  has a past surgical history that includes Back surgery (1986); Breast reconstruction; and Cataract extraction. Family History:  Herfamily history includes Coronary artery disease in her brother; Hypertension in her mother. Social History:   reports that she quit smoking about 14 years ago. Her smoking use included cigarettes. She has a 30.00 pack-year smoking history. She has never used smokeless tobacco. She reports current alcohol use of about 14.0 standard drinks of alcohol per week. She reports that she does not use drugs.    Observations/Objective:  General : Well sounding patient in no apparent distress HEENT: no hoarseness, no cough for duration of visit Lungs: speaks in complete sentences, no audible wheezing, no apparent distress Neurological: alert, oriented x 3 Psychiatric: pleasant, judgement appropriate    Assessment and Plan:  Deanna Gomez was seen today for advice only.  Diagnoses and all orders for this visit:  Chronic idiopathic constipation Suggested trial of magnesium citrate; drink 1/2 bottle followed by 2 tall glasses of water; repeat in 4 hours if needed If still not having a BM in 4 hours after second dose of mag citrate, per Dr. Oneta Rack recommendation suggested capful of miralax with tall glass of water  every 2 hours until BM Suggested addition of  stimulant (senokot) to daily maintenance meds once she has had BM If frequent severe constipation, consider trial of amitiza  - Increase fiber/ water intake, decrease caffeine, increase activity level  Please go to the hospital if you have severe abdominal pain, vomiting, fever, CP, SOB.     Follow Up Instructions:    I discussed the assessment and treatment plan with the patient. The patient was provided an opportunity to ask questions and all were answered. The patient agreed with the plan and demonstrated an understanding of the instructions.   The patient was advised to call back or seek an in-person evaluation if the symptoms worsen or if the condition fails to improve as anticipated.  I provided 15 minutes of non-face-to-face time during this encounter.   [name], NP

## 2019-01-02 NOTE — Telephone Encounter (Signed)
Patient states that she has not had a bowel movement and to is requesting something be sent into the pharmacy. Please advise.

## 2019-01-06 ENCOUNTER — Encounter: Payer: Self-pay | Admitting: Adult Health

## 2019-01-06 DIAGNOSIS — K5900 Constipation, unspecified: Secondary | ICD-10-CM | POA: Insufficient documentation

## 2019-01-06 NOTE — Progress Notes (Signed)
FOLLOW UP  Assessment and Plan:   Atherosclerosis of aorta By imaging Control blood pressure, cholesterol, glucose, increase exercise.   Diastolic CHF Grade 2 dysfunction, Weight stable/down Followed by cardiology  Essential hypertension - continue medications, DASH diet, exercise and monitor at home. Call if greater than 130/80.   Hepatitis C virus infection without hepatic coma, unspecified chronicity S/p treatment, in remission -     CMP/GFR at routine OVs  Hepatic steatosis Weight loss advised, avoid alcohol/tylenol, will monitor LFTs  Hyperlipidemia -she would like to reduce pill burden; d/c atorvastatin, zetia; try rosuvastatin 40 mg daily  check lipids, decrease fatty foods, increase activity.  -     Lipid panel  Other abnormal glucose (hx of prediabetes) Recent A1Cs at goal Discussed diet/exercise, weight management  Defer A1C; check CMP  Medication management -     Magnesium  Obesity Long discussion about weight loss, diet, and exercise Recommended diet heavy in fruits and veggies and low in animal meats, cheeses, and dairy products, appropriate calorie intake Patient will work on getting back on metabolism regulation diet and start working Discussed appropriate weight for height and initial goal (200lb) Follow up at next visit  Vitamin D deficiency At goal at recent check; continue to recommend supplementation for goal of 60-100 Defer vitamin D level  Insomnia Uses xanax PRN;  good sleep hygiene discussed, increase day time activity, try melatonin or benadryl   Chronic idiopathic constipation Continue miralax, start senokot daily Repeat 1/2 bottle magnesium citrate only if needed for severe constipation ~3+ days despite conservative interventions - Increase fiber/ water intake, decrease caffeine, increase activity level, Laboratory tests per orders. Please go to the hospital if you have severe abdominal pain, vomiting, fever, CP, SOB.   Continue  diet and meds as discussed. Further disposition pending results of labs. Discussed med's effects and SE's.   Over 30 minutes of exam, counseling, chart review, and critical decision making was performed.   Future Appointments  Date Time Provider [place]  04/14/2019 10:00 AM Unk Pinto, MD [facility] None  07/17/2019 11:15 AM Liane Comber, NP [facility] None    ----------------------------------------------------------------------------------------------------------------------  HPI 73 y.o. female  presents for 3 month follow up on hypertension, CHF, cholesterol, glucose, weight and vitamin D deficiency. She also was treated for Hepatitis C (1987) and remains in remission though with evidence of fatty liver on imaging which is monitored closely, recent LFTs WNL.   She reports constipation recently discussed via phone on 10/29 is resolved after 1/2 bottle of mag citrate and very large BM "toilet full", now taking miralax daily, hasn't been taking senokot daily as recommended. Reports having very small BMs most days. Today she reports only drinking 2 glasses of water daily.   She has insomnia and is prescribed xanax 0.25-0.5 mg which she takes only if unable to fall asleep after waking up in the middle of the night or if very anxious due to storm. Typically takes 0.25 mg, 3-4 days per week.   BMI is Body mass index is 34.24 kg/m., she has been working on diet and exercise. She is going to PT, doing stretching/strenght training at home daily, is working on diet/portions, Weight goal <200 lb.  Wt Readings from Last 3 Encounters:  01/07/19 217 lb (98.4 kg)  10/03/18 216 lb 3.2 oz (98.1 kg)  10/02/18 217 lb (98.4 kg)   She has abdominal aortic atherosclerosis noted incidentally by imaging.  Her blood pressure has been controlled at home (130s/67-80s), today their BP is  BP: 140/78 She attributes elevated BP to election today and discord at home over this.   She does workout. She  denies chest pain, shortness of breath, dizziness.  She has a history of Diastolic CHF, last ECHO 2019 showed LVEF 60-65%.  She follows with Dr. Clifton JamesMcAlhany  Denies dyspnea on exertion, orthopnea, paroxysmal nocturnal dyspnea and edema. Positive for none.   She is on cholesterol medication Atorvastatin and Zetia and denies myalgias. Her cholesterol is at goal of LDL <100. The cholesterol last visit was:   Lab Results  Component Value Date   CHOL 199 10/03/2018   HDL 84 10/03/2018   LDLCALC 94 10/03/2018   TRIG 116 10/03/2018   CHOLHDL 2.4 10/03/2018    She has been working on diet and exercise for glucose management, and denies increased appetite, nausea, paresthesia of the feet, polydipsia, polyuria and visual disturbances. Last A1C in the office was:  Lab Results  Component Value Date   HGBA1C 5.6 10/03/2018   Lab Results  Component Value Date   GFRNONAA 81 10/03/2018   Patient is on Vitamin D supplement.   Lab Results  Component Value Date   VD25OH 59 10/03/2018        Current Medications:  Current Outpatient Medications on File Prior to Visit  Medication Sig  . ALPRAZolam (XANAX) 0.5 MG tablet Take 1/2-1 tablet 2 - 3 x /day ONLY if needed for Anxiety Attack or Sleep &  limit to 5 days /week to avoid addiction  . aspirin EC 81 MG tablet Take 81 mg by mouth daily.  Marland Kitchen. atenolol (TENORMIN) 100 MG tablet TAKE ONE TABLET BY MOUTH DAILY FOR BLOOD PRESSURE  . atorvastatin (LIPITOR) 80 MG tablet TAKE ONE-HALF TO ONE TABLET BY MOUTH DAILY FOR CHOLESTEROL  . Cholecalciferol (VITAMIN D3 PO) Take 5,000 Units by mouth daily.   Marland Kitchen. ezetimibe (ZETIA) 10 MG tablet TAKE ONE TABLET BY MOUTH DAILY FOR CHOLESTEROL  . hydrochlorothiazide (HYDRODIURIL) 25 MG tablet Take 1 tablet daily for BP and fluid  . losartan (COZAAR) 100 MG tablet TAKE ONE TABLET BY MOUTH DAILY  . Magnesium 500 MG TABS Take 1 tablet (500 mg total) by mouth 2 (two) times daily with a meal.  . nystatin (NYSTATIN) powder Apply  daily after a shower as needed  . polyethylene glycol powder (GLYCOLAX/MIRALAX) 17 GM/SCOOP powder Take 1 Container by mouth once.  . senna (SENOKOT) 8.6 MG tablet Take 1 tablet by mouth daily.  . traZODone (DESYREL) 50 MG tablet 1/2-1 tablet for sleep (Patient not taking: Reported on 01/07/2019)   No current facility-administered medications on file prior to visit.      Allergies:  Allergies  Allergen Reactions  . Fentanyl Other (See Comments)    Intolerlance     Medical History:  Past Medical History:  Diagnosis Date  . Abdominal aortic atherosclerosis (HCC) 12/26/2016   Noted incidentally on 12/24/2016 CT  . Gastric ulcer 11/19/2014  . Hepatic steatosis 12/26/2016   Noted on CT 12/24/2016 - moderate degree without distinct lesions  . Hepatitis C   . HTN (hypertension)   . Hyperlipidemia   . Obesity   . Pre-diabetes   . Scoliosis   . Tobacco use    stopped 6 years ago.  . Vitamin D deficiency    Family history- Reviewed and unchanged Social history- Reviewed and unchanged   Review of Systems:  Review of Systems  Constitutional: Negative for malaise/fatigue and weight loss.  HENT: Negative for hearing loss and tinnitus.  Eyes: Negative for blurred vision and double vision.  Respiratory: Negative for cough, shortness of breath and wheezing.   Cardiovascular: Negative for chest pain, palpitations, orthopnea, claudication and leg swelling.  Gastrointestinal: Positive for constipation. Negative for abdominal pain, blood in stool, diarrhea, heartburn, melena, nausea and vomiting.  Genitourinary: Negative.   Musculoskeletal: Negative for joint pain and myalgias.  Skin: Negative for rash.  Neurological: Negative for dizziness, tingling, sensory change, weakness and headaches.  Endo/Heme/Allergies: Negative for polydipsia.  Psychiatric/Behavioral: Negative.   All other systems reviewed and are negative.     Physical Exam: BP 140/78   Pulse (!) 101   Temp (!) 97.3  F (36.3 C)   Ht 5' 6.75" (1.695 m)   Wt 217 lb (98.4 kg)   SpO2 98%   BMI 34.24 kg/m  Wt Readings from Last 3 Encounters:  01/07/19 217 lb (98.4 kg)  10/03/18 216 lb 3.2 oz (98.1 kg)  10/02/18 217 lb (98.4 kg)   General Appearance: Well nourished, in no apparent distress. Eyes: PERRLA, EOMs, conjunctiva no swelling or erythema Sinuses: No Frontal/maxillary tenderness ENT/Mouth: Ext aud canals clear, TMs without erythema, bulging. No erythema, swelling, or exudate on post pharynx.  Tonsils not swollen or erythematous. Hearing normal.  Neck: Supple, thyroid normal.  Respiratory: Respiratory effort normal, BS equal bilaterally without rales, rhonchi, wheezing or stridor.  Cardio: RRR with no MRGs. Brisk peripheral pulses without edema.  Abdomen: Soft, + BS.  Non tender, no guarding, rebound, hernias, masses. Lymphatics: Non tender without lymphadenopathy.  Musculoskeletal: Full ROM, 5/5 strength, Normal gait Skin: Warm, dry without rashes, lesions, ecchymosis.  Neuro: Cranial nerves intact. No cerebellar symptoms.  Psych: Awake and oriented X 3, normal affect, Insight and Judgment appropriate.    Dan Maker, NP 3:05 PM [facility]

## 2019-01-07 ENCOUNTER — Ambulatory Visit: Payer: Medicare Other | Admitting: Physician Assistant

## 2019-01-07 ENCOUNTER — Encounter: Payer: Self-pay | Admitting: Adult Health

## 2019-01-07 ENCOUNTER — Ambulatory Visit (INDEPENDENT_AMBULATORY_CARE_PROVIDER_SITE_OTHER): Payer: Medicare Other | Admitting: Adult Health

## 2019-01-07 ENCOUNTER — Other Ambulatory Visit: Payer: Self-pay

## 2019-01-07 VITALS — BP 140/78 | HR 101 | Temp 97.3°F | Ht 66.75 in | Wt 217.0 lb

## 2019-01-07 DIAGNOSIS — B1920 Unspecified viral hepatitis C without hepatic coma: Secondary | ICD-10-CM | POA: Diagnosis not present

## 2019-01-07 DIAGNOSIS — G4700 Insomnia, unspecified: Secondary | ICD-10-CM

## 2019-01-07 DIAGNOSIS — I700 Atherosclerosis of aorta: Secondary | ICD-10-CM | POA: Diagnosis not present

## 2019-01-07 DIAGNOSIS — E559 Vitamin D deficiency, unspecified: Secondary | ICD-10-CM

## 2019-01-07 DIAGNOSIS — E782 Mixed hyperlipidemia: Secondary | ICD-10-CM

## 2019-01-07 DIAGNOSIS — E669 Obesity, unspecified: Secondary | ICD-10-CM

## 2019-01-07 DIAGNOSIS — I10 Essential (primary) hypertension: Secondary | ICD-10-CM

## 2019-01-07 DIAGNOSIS — K5904 Chronic idiopathic constipation: Secondary | ICD-10-CM

## 2019-01-07 DIAGNOSIS — R7309 Other abnormal glucose: Secondary | ICD-10-CM

## 2019-01-07 DIAGNOSIS — K760 Fatty (change of) liver, not elsewhere classified: Secondary | ICD-10-CM

## 2019-01-07 DIAGNOSIS — I5032 Chronic diastolic (congestive) heart failure: Secondary | ICD-10-CM | POA: Diagnosis not present

## 2019-01-07 MED ORDER — ROSUVASTATIN CALCIUM 40 MG PO TABS: 40.0000 mg | ORAL_TABLET | Freq: Every day | ORAL | 1 refills | Status: DC

## 2019-01-07 NOTE — Patient Instructions (Addendum)
STOP atorvastatin and zetia - replace with rosuvastatin 40 mg daily     Increase water - keep a log  Try to get in 4+ bottles daily (65+ fluid ounces)  Can take miralax - daily (this softens stool - NEED to drink lots of water with this).   Senokot - mild stimulant, helps bowels "move"     If severely constipated can also do miralax 1 scoop followed by 2 glasses of water, repeat every 2-4 hours until you have a bowel movement - this is essentially like a bowel cleanse prior to colonoscopy   If still not moving, then do 1/2 bottle of magnesium citrate with 2 glasses of water, repeat in 4 hour IF NEEDED ONLY     Constipation, Adult Constipation is when a person has fewer bowel movements in a week than normal, has difficulty having a bowel movement, or has stools that are dry, hard, or larger than normal. Constipation may be caused by an underlying condition. It may become worse with age if a person takes certain medicines and does not take in enough fluids. Follow these instructions at home: Eating and drinking   Eat foods that have a lot of fiber, such as fresh fruits and vegetables, whole grains, and beans.  Limit foods that are high in fat, low in fiber, or overly processed, such as french fries, hamburgers, cookies, candies, and soda.  Drink enough fluid to keep your urine clear or pale yellow. General instructions  Exercise regularly or as told by your health care provider.  Go to the restroom when you have the urge to go. Do not hold it in.  Take over-the-counter and prescription medicines only as told by your health care provider. These include any fiber supplements.  Practice pelvic floor retraining exercises, such as deep breathing while relaxing the lower abdomen and pelvic floor relaxation during bowel movements.  Watch your condition for any changes.  Keep all follow-up visits as told by your health care provider. This is important. Contact a health care provider  if:  You have pain that gets worse.  You have a fever.  You do not have a bowel movement after 4 days.  You vomit.  You are not hungry.  You lose weight.  You are bleeding from the anus.  You have thin, pencil-like stools. Get help right away if:  You have a fever and your symptoms suddenly get worse.  You leak stool or have blood in your stool.  Your abdomen is bloated.  You have severe pain in your abdomen.  You feel dizzy or you faint. This information is not intended to replace advice given to you by your health care provider. Make sure you discuss any questions you have with your health care provider. Document Released: 11/19/2003 Document Revised: 02/02/2017 Document Reviewed: 08/11/2015 Elsevier Patient Education  2020 [employer].

## 2019-01-08 LAB — CBC WITH DIFFERENTIAL/PLATELET
Absolute Monocytes: 604 cells/uL (ref 200–950)
Basophils Absolute: 40 cells/uL (ref 0–200)
Basophils Relative: 0.4 %
Eosinophils Absolute: 10 cells/uL — ABNORMAL LOW (ref 15–500)
Eosinophils Relative: 0.1 %
HCT: 36.8 % (ref 35.0–45.0)
Hemoglobin: 12.8 g/dL (ref 11.7–15.5)
Lymphs Abs: 1148 cells/uL (ref 850–3900)
MCH: 31.4 pg (ref 27.0–33.0)
MCHC: 34.8 g/dL (ref 32.0–36.0)
MCV: 90.2 fL (ref 80.0–100.0)
MPV: 9.7 fL (ref 7.5–12.5)
Monocytes Relative: 6.1 %
Neutro Abs: 8098 cells/uL — ABNORMAL HIGH (ref 1500–7800)
Neutrophils Relative %: 81.8 %
Platelets: 306 10*3/uL (ref 140–400)
RBC: 4.08 10*6/uL (ref 3.80–5.10)
RDW: 12.5 % (ref 11.0–15.0)
Total Lymphocyte: 11.6 %
WBC: 9.9 10*3/uL (ref 3.8–10.8)

## 2019-01-08 LAB — COMPLETE METABOLIC PANEL WITH GFR
AG Ratio: 1.7 (calc) (ref 1.0–2.5)
ALT: 14 U/L (ref 6–29)
AST: 21 U/L (ref 10–35)
Albumin: 4.7 g/dL (ref 3.6–5.1)
Alkaline phosphatase (APISO): 81 U/L (ref 37–153)
BUN: 10 mg/dL (ref 7–25)
CO2: 31 mmol/L (ref 20–32)
Calcium: 10.3 mg/dL (ref 8.6–10.4)
Chloride: 91 mmol/L — ABNORMAL LOW (ref 98–110)
Creat: 0.81 mg/dL (ref 0.60–0.93)
GFR, Est African American: 82 mL/min/{1.73_m2} (ref >=60)
GFR, Est Non African American: 71 mL/min/{1.73_m2} (ref >=60)
Globulin: 2.7 g/dL (calc) (ref 1.9–3.7)
Glucose, Bld: 108 mg/dL — ABNORMAL HIGH (ref 65–99)
Potassium: 4.3 mmol/L (ref 3.5–5.3)
Sodium: 132 mmol/L — ABNORMAL LOW (ref 135–146)
Total Bilirubin: 0.5 mg/dL (ref 0.2–1.2)
Total Protein: 7.4 g/dL (ref 6.1–8.1)

## 2019-01-08 LAB — LIPID PANEL
Cholesterol: 199 mg/dL (ref <200)
HDL: 97 mg/dL (ref >=50)
LDL Cholesterol (Calc): 84 mg/dL (calc)
Non-HDL Cholesterol (Calc): 102 mg/dL (calc) (ref <130)
Total CHOL/HDL Ratio: 2.1 (calc) (ref <5.0)
Triglycerides: 87 mg/dL (ref <150)

## 2019-01-08 LAB — TSH: TSH: 1.65 mIU/L (ref 0.40–4.50)

## 2019-01-08 LAB — MAGNESIUM: Magnesium: 1.4 mg/dL — ABNORMAL LOW (ref 1.5–2.5)

## 2019-02-09 ENCOUNTER — Other Ambulatory Visit: Payer: Self-pay | Admitting: Adult Health

## 2019-02-09 DIAGNOSIS — I10 Essential (primary) hypertension: Secondary | ICD-10-CM

## 2019-02-13 ENCOUNTER — Other Ambulatory Visit: Payer: Self-pay

## 2019-02-13 DIAGNOSIS — G4700 Insomnia, unspecified: Secondary | ICD-10-CM

## 2019-02-13 NOTE — Telephone Encounter (Signed)
Refill request for Alprazolam. In que for your review.  

## 2019-02-15 ENCOUNTER — Other Ambulatory Visit: Payer: Self-pay | Admitting: Internal Medicine

## 2019-02-15 DIAGNOSIS — G4700 Insomnia, unspecified: Secondary | ICD-10-CM

## 2019-02-15 MED ORDER — ALPRAZOLAM 0.5 MG PO TABS: ORAL_TABLET | ORAL | 0 refills | Status: DC

## 2019-03-10 DIAGNOSIS — M546 Pain in thoracic spine: Secondary | ICD-10-CM | POA: Diagnosis not present

## 2019-03-10 DIAGNOSIS — M545 Low back pain: Secondary | ICD-10-CM | POA: Diagnosis not present

## 2019-03-11 DIAGNOSIS — H02834 Dermatochalasis of left upper eyelid: Secondary | ICD-10-CM | POA: Diagnosis not present

## 2019-03-11 DIAGNOSIS — H43813 Vitreous degeneration, bilateral: Secondary | ICD-10-CM | POA: Diagnosis not present

## 2019-03-11 DIAGNOSIS — D3131 Benign neoplasm of right choroid: Secondary | ICD-10-CM | POA: Diagnosis not present

## 2019-03-11 DIAGNOSIS — H02831 Dermatochalasis of right upper eyelid: Secondary | ICD-10-CM | POA: Diagnosis not present

## 2019-03-24 DIAGNOSIS — M546 Pain in thoracic spine: Secondary | ICD-10-CM | POA: Diagnosis not present

## 2019-03-24 DIAGNOSIS — M545 Low back pain: Secondary | ICD-10-CM | POA: Diagnosis not present

## 2019-03-27 ENCOUNTER — Other Ambulatory Visit: Payer: Self-pay | Admitting: Adult Health

## 2019-03-27 ENCOUNTER — Ambulatory Visit: Payer: Medicare PPO | Attending: Internal Medicine

## 2019-03-27 DIAGNOSIS — Z23 Encounter for immunization: Secondary | ICD-10-CM | POA: Insufficient documentation

## 2019-03-27 NOTE — Progress Notes (Signed)
   Covid-19 Vaccination Clinic  Name:  Deanna Gomez    MRN: [id number] DOB: 23-Dec-1945  03/27/2019  Deanna Gomez was observed post Covid-19 immunization for 15 minutes without incidence. She was provided with Vaccine Information Sheet and instruction to access the V-Safe system.   Deanna Gomez was instructed to call 911 with any severe reactions post vaccine: Marland Kitchen Difficulty breathing  . Swelling of your face and throat  . A fast heartbeat  . A bad rash all over your body  . Dizziness and weakness    Immunizations Administered    Name Date Dose VIS Date Route   Pfizer COVID-19 Vaccine 03/27/2019  9:17 AM 0.3 mL 02/14/2019 Intramuscular   Manufacturer: [employer], [employer]   Lot: [id number]   NDC: [address]-8

## 2019-04-07 DIAGNOSIS — M546 Pain in thoracic spine: Secondary | ICD-10-CM | POA: Diagnosis not present

## 2019-04-07 DIAGNOSIS — M545 Low back pain: Secondary | ICD-10-CM | POA: Diagnosis not present

## 2019-04-13 ENCOUNTER — Encounter: Payer: Self-pay | Admitting: Internal Medicine

## 2019-04-13 MED ORDER — EZETIMIBE 10 MG PO TABS: ORAL_TABLET | ORAL | 3 refills | Status: DC

## 2019-04-13 MED ORDER — ATORVASTATIN CALCIUM 80 MG PO TABS: ORAL_TABLET | ORAL | 3 refills | Status: DC

## 2019-04-13 NOTE — Patient Instructions (Signed)

## 2019-04-13 NOTE — Progress Notes (Signed)
Annual Screening/Preventative Visit & Comprehensive Evaluation &  Examination     This very nice 74 y.o. MWF  presents for a Screening /Preventative Visit & comprehensive evaluation and management of multiple medical co-morbidities.  Patient has been followed for HTN, HLD, Prediabetes  and Vitamin D Deficiency.  Patient has hx/oChronic LBP from severe thoracolumbar scoliosis and is post DDD lumbar fusion surgeryx3.Other problems include Hepatitis C  in 1987 (treated) and she also has hx/o Fatty Liver. Patient relates recent sensation of hyperaccusis, Hyperosmia and Dysgeusia.       HTN predates since 1996. Stress Myoview in 2013 was Negative. She has CKD2 attributed to her HTVD.  Patient's BP has been controlled at home and patient denies any cardiac symptoms as chest pain, palpitations, shortness of breath, dizziness or ankle swelling. Today's BP is at goal - 122/80.      Patient's hyperlipidemia is controlled with diet and medications. Patient denies myalgias or other medication SE's. Last lipids were at goal:  Lab Results  Component Value Date   CHOL 199 01/07/2019   HDL 97 01/07/2019   LDLCALC 84 01/07/2019   TRIG 87 01/07/2019   CHOLHDL 2.1 01/07/2019       Patient has moderate Obesity (BMI 34.7+) and consequent  prediabetes (A1c 5.8% / 2016)  and patient denies reactive hypoglycemic symptoms, visual blurring, diabetic polys or paresthesias. Last A1c was Normal & at goal:  Lab Results  Component Value Date   HGBA1C 5.6 10/03/2018   Wt Readings from Last 3 Encounters:  04/14/19 213 lb 12.8 oz (97 kg)  01/07/19 217 lb (98.4 kg)  10/03/18 216 lb 3.2 oz (98.1 kg)       Finally, patient has history of Vitamin D Deficiency ("12" / 2008)   and last Vitamin D was near goal (70-100):  Lab Results  Component Value Date   VD25OH 59 10/03/2018   Current Outpatient Medications on File Prior to Visit  Medication Sig  . ALPRAZolam (XANAX) 0.5 MG tablet Take 1/2-1 tablet 2 - 3 x  /day ONLY if needed for Anxiety Attack or Sleep &  limit to 5 days /week to avoid addiction  . aspirin EC 81 MG tablet Take 81 mg by mouth daily.  Marland Kitchen atenolol (TENORMIN) 100 MG tablet TAKE ONE TABLET BY MOUTH DAILY FOR BLOOD PRESSURE  . Cholecalciferol (VITAMIN D3 PO) Take 5,000 Units by mouth daily.   . hydrochlorothiazide (HYDRODIURIL) 25 MG tablet Take 1 tablet Daily for BP & Fluid Retention / Ankle Swelling  . losartan (COZAAR) 100 MG tablet TAKE ONE TABLET BY MOUTH DAILY  . Magnesium 500 MG TABS Take 1 tablet (500 mg total) by mouth 2 (two) times daily with a meal.  . nystatin (NYSTATIN) powder Apply daily after a shower as needed  . polyethylene glycol powder (GLYCOLAX/MIRALAX) 17 GM/SCOOP powder Take 1 Container by mouth once.  . rosuvastatin (CRESTOR) 40 MG tablet Take 1 tablet (40 mg total) by mouth daily.  Marland Kitchen senna (SENOKOT) 8.6 MG tablet Take 1 tablet by mouth daily.   No current facility-administered medications on file prior to visit.   Allergies  Allergen Reactions  . Fentanyl Other (See Comments)    Intolerlance   Past Medical History:  Diagnosis Date  . Abdominal aortic atherosclerosis ([place]) 12/26/2016   Noted incidentally on 12/24/2016 CT  . Gastric ulcer 11/19/2014  . Hepatic steatosis 12/26/2016   Noted on CT 12/24/2016 - moderate degree without distinct lesions  . Hepatitis C   . HTN (hypertension)   .  Hyperlipidemia   . Obesity   . Pre-diabetes   . Scoliosis   . Tobacco use    stopped 6 years ago.  . Vitamin D deficiency    Health Maintenance  Topic Date Due  . TETANUS/TDAP  03/06/2016  . COLONOSCOPY  10/28/2024  . INFLUENZA VACCINE  Completed  . DEXA SCAN  Completed  . Hepatitis C Screening  Completed  . PNA vac Low Risk Adult  Completed   Immunization History  Administered Date(s) Administered  . DTaP 03/06/2006  . Influenza Split 11/20/2013, 12/24/2014  . Influenza Whole 12/19/2012  . Influenza, High Dose Seasonal PF 01/04/2017, 12/10/2017  .  PFIZER SARS-COV-2 Vaccination 03/27/2019  . Pneumococcal Conjugate-13 05/13/2014  . Pneumococcal Polysaccharide-23 03/06/2008, 10/25/2016  . Typhoid Live 03/06/2004  . Zoster 06/11/2007   Last Colon - 10/29/2014 Colonic Snare polypectomy & biopsy - Dr Kinnie Scales - Recc 5 year f/u due Sept 2021  Last MGM - 12/04/2018  Past Surgical History:  Procedure Laterality Date  . BACK SURGERY  1986   x 3  . BREAST RECONSTRUCTION     x 2  . CATARACT EXTRACTION     Bilateral   Family History  Problem Relation Age of Onset  . Hypertension Mother   . Coronary artery disease Brother    Social History   Tobacco Use  . Smoking status: Former Smoker    Packs/day: 1.00    Years: 30.00    Pack years: 30.00    Types: Cigarettes    Quit date: 08/28/2004    Years since quitting: 14.6  . Smokeless tobacco: Never Used  . Tobacco comment: QUIT SIX YEARS AGO  Substance Use Topics  . Alcohol use: Yes    Alcohol/week: 14.0 standard drinks    Types: 14 Standard drinks or equivalent per week    Comment: Drinks 2-3 glasses wine nightly x 20 years  . Drug use: No    ROS Constitutional: Denies fever, chills, weight loss/gain, headaches, insomnia,  night sweats, and change in appetite. Does c/o fatigue. Eyes: Denies redness, blurred vision, diplopia, discharge, itchy, watery eyes.  ENT: Denies discharge, congestion, post nasal drip, epistaxis, sore throat, earache, hearing loss, dental pain, Tinnitus, Vertigo, Sinus pain, snoring.  Cardio: Denies chest pain, palpitations, irregular heartbeat, syncope, dyspnea, diaphoresis, orthopnea, PND, claudication, edema Respiratory: denies cough, dyspnea, DOE, pleurisy, hoarseness, laryngitis, wheezing.  Gastrointestinal: Denies dysphagia, heartburn, reflux, water brash, pain, cramps, nausea, vomiting, bloating, diarrhea, constipation, hematemesis, melena, hematochezia, jaundice, hemorrhoids Genitourinary: Denies dysuria, frequency, urgency, nocturia, hesitancy,  discharge, hematuria, flank pain Breast: Breast lumps, nipple discharge, bleeding.  Musculoskeletal: Denies arthralgia, myalgia, stiffness, Jt. Swelling, pain, limp, and strain/sprain. Denies falls. Skin: Denies puritis, rash, hives, warts, acne, eczema, changing in skin lesion Neuro: No weakness, tremor, incoordination, spasms, paresthesia, pain Psychiatric: Denies confusion, memory loss, sensory loss. Denies Depression. Endocrine: Denies change in weight, skin, hair change, nocturia, and paresthesia, diabetic polys, visual blurring, hyper / hypo glycemic episodes.  Heme/Lymph: No excessive bleeding, bruising, enlarged lymph nodes.  Physical Exam  BP 122/80   Pulse 60   Temp 97.6 F (36.4 C)   Resp 16   Ht 5' 5.75" (1.67 m)   Wt 213 lb 12.8 oz (97 kg)   BMI 34.77 kg/m   General Appearance:  Over nourished, well groomed and in no apparent distress.  Eyes: PERRLA, EOMs, conjunctiva no swelling or erythema, normal fundi and vessels. Sinuses: No frontal/maxillary tenderness ENT/Mouth: EACs patent / TMs  nl. Nares clear without erythema, swelling,  mucoid exudates. Oral hygiene is good. No erythema, swelling, or exudate. Tongue normal, non-obstructing. Tonsils not swollen or erythematous. Hearing normal.  Neck: Supple, thyroid not palpable. No bruits, nodes or JVD. Respiratory: Respiratory effort normal.  BS equal and clear bilateral without rales, rhonci, wheezing or stridor. Cardio: Heart sounds are normal with regular rate and rhythm and no murmurs, rubs or gallops. Peripheral pulses are normal and equal bilaterally without edema. No aortic or femoral bruits. Chest: symmetric with normal excursions and percussion. Breasts: Symmetric, without lumps, nipple discharge, retractions, or fibrocystic changes.  Abdomen: Flat, soft with bowel sounds active. Nontender, no guarding, rebound, hernias, masses, or organomegaly.  Lymphatics: Non tender without lymphadenopathy.  Musculoskeletal: Full  ROM all peripheral extremities, joint stability, 5/5 strength, and normal gait. Skin: Warm and dry without rashes, lesions, cyanosis, clubbing or  ecchymosis.  Neuro: Cranial nerves intact, reflexes equal bilaterally. Normal muscle tone, no cerebellar symptoms. Sensation intact.  Pysch: Alert and oriented X 3, normal affect, Insight and Judgment appropriate.   Assessment and Plan  1. Annual Preventative Screening Examination  2. Essential hypertension  - EKG 12-Lead - Urinalysis, Routine w reflex microscopic - Microalbumin / creatinine urine ratio - CBC with Differential/Platelet - COMPLETE METABOLIC PANEL WITH GFR - Magnesium - TSH  3. Hyperlipidemia, mixed  - EKG 12-Lead - Lipid panel - TSH  4. Abnormal glucose  - EKG 12-Lead - Hemoglobin A1c - Insulin, random  5. Vitamin D deficiency  - VITAMIN D 25 Hydroxy  6. Prediabetes  - EKG 12-Lead - Hemoglobin A1c - Insulin, random  7. Class 2 severe obesity due to excess calories with serious  comorbidity and BMI of 35.0 to 35.9 in adult (HCC)   8. Screening for colorectal cancer  - POC Hemoccult Bld/Stl  9. Screening for ischemic heart disease  - EKG 12-Lead  10. FH: hypertension  - EKG 12-Lead  11. Former smoker  - EKG 12-Lead  12. Abdominal aortic atherosclerosis (HCC)  - EKG 12-Lead  13. Medication management  - Urinalysis, Routine w reflex microscopic - Microalbumin / creatinine urine ratio - CBC with Differential/Platelet - COMPLETE METABOLIC PANEL WITH GFR - Magnesium - Lipid panel - TSH - Hemoglobin A1c - Insulin, random - VITAMIN D  25 Hydroxy         Patient was counseled in prudent diet to achieve/maintain BMI less than 25 for weight control, BP monitoring, regular exercise and medications. Discussed med's effects and SE's. Screening labs and tests as requested with regular follow-up as recommended. Over 40 minutes of exam, counseling, chart review and high complex critical decision  making was performed.   Marinus Maw, MD

## 2019-04-14 ENCOUNTER — Other Ambulatory Visit: Payer: Self-pay | Admitting: Internal Medicine

## 2019-04-14 ENCOUNTER — Ambulatory Visit: Payer: Medicare PPO | Admitting: Internal Medicine

## 2019-04-14 ENCOUNTER — Other Ambulatory Visit: Payer: Self-pay

## 2019-04-14 VITALS — BP 122/80 | HR 60 | Temp 97.6°F | Resp 16 | Ht 65.75 in | Wt 213.8 lb

## 2019-04-14 DIAGNOSIS — I10 Essential (primary) hypertension: Secondary | ICD-10-CM | POA: Diagnosis not present

## 2019-04-14 DIAGNOSIS — Z0001 Encounter for general adult medical examination with abnormal findings: Secondary | ICD-10-CM

## 2019-04-14 DIAGNOSIS — Z136 Encounter for screening for cardiovascular disorders: Secondary | ICD-10-CM | POA: Diagnosis not present

## 2019-04-14 DIAGNOSIS — Z79899 Other long term (current) drug therapy: Secondary | ICD-10-CM

## 2019-04-14 DIAGNOSIS — Z87891 Personal history of nicotine dependence: Secondary | ICD-10-CM | POA: Diagnosis not present

## 2019-04-14 DIAGNOSIS — R7309 Other abnormal glucose: Secondary | ICD-10-CM

## 2019-04-14 DIAGNOSIS — Z1211 Encounter for screening for malignant neoplasm of colon: Secondary | ICD-10-CM

## 2019-04-14 DIAGNOSIS — E782 Mixed hyperlipidemia: Secondary | ICD-10-CM | POA: Diagnosis not present

## 2019-04-14 DIAGNOSIS — Z8249 Family history of ischemic heart disease and other diseases of the circulatory system: Secondary | ICD-10-CM

## 2019-04-14 DIAGNOSIS — Z0000 Encounter for general adult medical examination without abnormal findings: Secondary | ICD-10-CM

## 2019-04-14 DIAGNOSIS — Z6835 Body mass index (BMI) 35.0-35.9, adult: Secondary | ICD-10-CM

## 2019-04-14 DIAGNOSIS — R300 Dysuria: Secondary | ICD-10-CM | POA: Diagnosis not present

## 2019-04-14 DIAGNOSIS — E559 Vitamin D deficiency, unspecified: Secondary | ICD-10-CM | POA: Diagnosis not present

## 2019-04-14 DIAGNOSIS — I700 Atherosclerosis of aorta: Secondary | ICD-10-CM

## 2019-04-14 DIAGNOSIS — R7303 Prediabetes: Secondary | ICD-10-CM

## 2019-04-14 DIAGNOSIS — Z1212 Encounter for screening for malignant neoplasm of rectum: Secondary | ICD-10-CM

## 2019-04-15 ENCOUNTER — Other Ambulatory Visit: Payer: Self-pay | Admitting: Internal Medicine

## 2019-04-15 DIAGNOSIS — Z79899 Other long term (current) drug therapy: Secondary | ICD-10-CM

## 2019-04-15 DIAGNOSIS — N179 Acute kidney failure, unspecified: Secondary | ICD-10-CM

## 2019-04-15 DIAGNOSIS — I10 Essential (primary) hypertension: Secondary | ICD-10-CM

## 2019-04-15 LAB — COMPLETE METABOLIC PANEL WITH GFR
AG Ratio: 1.5 (calc) (ref 1.0–2.5)
ALT: 16 U/L (ref 6–29)
AST: 21 U/L (ref 10–35)
Albumin: 4.5 g/dL (ref 3.6–5.1)
Alkaline phosphatase (APISO): 69 U/L (ref 37–153)
BUN/Creatinine Ratio: 17 (calc) (ref 6–22)
BUN: 22 mg/dL (ref 7–25)
CO2: 33 mmol/L — ABNORMAL HIGH (ref 20–32)
Calcium: 10.4 mg/dL (ref 8.6–10.4)
Chloride: 86 mmol/L — ABNORMAL LOW (ref 98–110)
Creat: 1.28 mg/dL — ABNORMAL HIGH (ref 0.60–0.93)
GFR, Est African American: 47 mL/min/{1.73_m2} — ABNORMAL LOW (ref >=60)
GFR, Est Non African American: 41 mL/min/{1.73_m2} — ABNORMAL LOW (ref >=60)
Globulin: 3 g/dL (calc) (ref 1.9–3.7)
Glucose, Bld: 114 mg/dL — ABNORMAL HIGH (ref 65–99)
Potassium: 4 mmol/L (ref 3.5–5.3)
Sodium: 130 mmol/L — ABNORMAL LOW (ref 135–146)
Total Bilirubin: 0.8 mg/dL (ref 0.2–1.2)
Total Protein: 7.5 g/dL (ref 6.1–8.1)

## 2019-04-15 LAB — CBC WITH DIFFERENTIAL/PLATELET
Absolute Monocytes: 809 cells/uL (ref 200–950)
Basophils Absolute: 28 cells/uL (ref 0–200)
Basophils Relative: 0.3 %
Eosinophils Absolute: 28 cells/uL (ref 15–500)
Eosinophils Relative: 0.3 %
HCT: 37.3 % (ref 35.0–45.0)
Hemoglobin: 13.1 g/dL (ref 11.7–15.5)
Lymphs Abs: 1116 cells/uL (ref 850–3900)
MCH: 32 pg (ref 27.0–33.0)
MCHC: 35.1 g/dL (ref 32.0–36.0)
MCV: 91.2 fL (ref 80.0–100.0)
MPV: 9.4 fL (ref 7.5–12.5)
Monocytes Relative: 8.7 %
Neutro Abs: 7319 cells/uL (ref 1500–7800)
Neutrophils Relative %: 78.7 %
Platelets: 263 10*3/uL (ref 140–400)
RBC: 4.09 10*6/uL (ref 3.80–5.10)
RDW: 12.5 % (ref 11.0–15.0)
Total Lymphocyte: 12 %
WBC: 9.3 10*3/uL (ref 3.8–10.8)

## 2019-04-15 LAB — URINE CULTURE
MICRO NUMBER:: 10128239
SPECIMEN QUALITY:: ADEQUATE

## 2019-04-15 LAB — URINALYSIS, ROUTINE W REFLEX MICROSCOPIC
Bilirubin Urine: NEGATIVE
Glucose, UA: NEGATIVE
Hgb urine dipstick: NEGATIVE
Hyaline Cast: NONE SEEN /LPF
Ketones, ur: NEGATIVE
Leukocytes,Ua: NEGATIVE
Nitrite: NEGATIVE
RBC / HPF: NONE SEEN /HPF (ref 0–2)
Specific Gravity, Urine: 1.012 (ref 1.001–1.03)
pH: 7.5 (ref 5.0–8.0)

## 2019-04-15 LAB — LIPID PANEL
Cholesterol: 184 mg/dL (ref <200)
HDL: 96 mg/dL (ref >=50)
LDL Cholesterol (Calc): 71 mg/dL (calc)
Non-HDL Cholesterol (Calc): 88 mg/dL (calc) (ref <130)
Total CHOL/HDL Ratio: 1.9 (calc) (ref <5.0)
Triglycerides: 91 mg/dL (ref <150)

## 2019-04-15 LAB — HEMOGLOBIN A1C
Hgb A1c MFr Bld: 5.8 % of total Hgb — ABNORMAL HIGH (ref <5.7)
Mean Plasma Glucose: 120 (calc)
eAG (mmol/L): 6.6 (calc)

## 2019-04-15 LAB — MAGNESIUM: Magnesium: 2.3 mg/dL (ref 1.5–2.5)

## 2019-04-15 LAB — INSULIN, RANDOM: Insulin: 14.7 u[IU]/mL

## 2019-04-15 LAB — MICROALBUMIN / CREATININE URINE RATIO
Creatinine, Urine: 87 mg/dL (ref 20–275)
Microalb Creat Ratio: 797 mcg/mg creat — ABNORMAL HIGH (ref <30)
Microalb, Ur: 69.3 mg/dL

## 2019-04-15 LAB — TSH: TSH: 2.04 mIU/L (ref 0.40–4.50)

## 2019-04-15 LAB — VITAMIN D 25 HYDROXY (VIT D DEFICIENCY, FRACTURES): Vit D, 25-Hydroxy: 64 ng/mL (ref 30–100)

## 2019-04-17 ENCOUNTER — Ambulatory Visit: Payer: Medicare PPO | Attending: Internal Medicine

## 2019-04-17 DIAGNOSIS — Z23 Encounter for immunization: Secondary | ICD-10-CM

## 2019-04-17 NOTE — Progress Notes (Signed)
   Covid-19 Vaccination Clinic  Name:  Deanna Gomez    MRN: [id number] DOB: 06-Nov-1945  04/17/2019  [name] was observed post Covid-19 immunization for 15 minutes without incidence. She was provided with Vaccine Information Sheet and instruction to access the V-Safe system.   [name] was instructed to call 911 with any severe reactions post vaccine: Marland Kitchen Difficulty breathing  . Swelling of your face and throat  . A fast heartbeat  . A bad rash all over your body  . Dizziness and weakness    Immunizations Administered    Name Date Dose VIS Date Route   Pfizer COVID-19 Vaccine 04/17/2019 12:26 PM 0.3 mL 02/14/2019 Intramuscular   Manufacturer: [employer], [employer]   Lot: [id number]   NDC: [address]-3

## 2019-04-21 DIAGNOSIS — M546 Pain in thoracic spine: Secondary | ICD-10-CM | POA: Diagnosis not present

## 2019-04-21 DIAGNOSIS — M545 Low back pain: Secondary | ICD-10-CM | POA: Diagnosis not present

## 2019-04-22 ENCOUNTER — Other Ambulatory Visit: Payer: Self-pay | Admitting: Adult Health

## 2019-04-22 DIAGNOSIS — I10 Essential (primary) hypertension: Secondary | ICD-10-CM

## 2019-04-23 ENCOUNTER — Telehealth: Payer: Self-pay | Admitting: *Deleted

## 2019-04-23 NOTE — Telephone Encounter (Signed)
Patient called and reported she was informed of a refill of Zetia by her pharmacy. She was under the impression she would only need to take Crestor, if her cholesterol numbers were good. Since she had excellent results, It is OK to continue with Crestor only, per Dr Oneta Rack.

## 2019-04-28 ENCOUNTER — Other Ambulatory Visit: Payer: Self-pay

## 2019-04-28 ENCOUNTER — Ambulatory Visit: Payer: Medicare PPO

## 2019-04-28 VITALS — BP 178/82 | HR 90 | Temp 97.5°F | Wt 217.4 lb

## 2019-04-28 DIAGNOSIS — I10 Essential (primary) hypertension: Secondary | ICD-10-CM | POA: Diagnosis not present

## 2019-04-28 DIAGNOSIS — Z79899 Other long term (current) drug therapy: Secondary | ICD-10-CM | POA: Diagnosis not present

## 2019-04-28 DIAGNOSIS — N179 Acute kidney failure, unspecified: Secondary | ICD-10-CM

## 2019-04-28 LAB — BASIC METABOLIC PANEL WITH GFR
BUN: 9 mg/dL (ref 7–25)
CO2: 28 mmol/L (ref 20–32)
Calcium: 9.7 mg/dL (ref 8.6–10.4)
Chloride: 95 mmol/L — ABNORMAL LOW (ref 98–110)
Creat: 0.91 mg/dL (ref 0.60–0.93)
GFR, Est African American: 72 mL/min/{1.73_m2} (ref >=60)
GFR, Est Non African American: 62 mL/min/{1.73_m2} (ref >=60)
Glucose, Bld: 91 mg/dL (ref 65–99)
Potassium: 4.7 mmol/L (ref 3.5–5.3)
Sodium: 133 mmol/L — ABNORMAL LOW (ref 135–146)

## 2019-04-28 NOTE — Progress Notes (Signed)
Patient presents to the office for a nurse visit to have labs drawn. Vitals taken and recorded.

## 2019-05-01 DIAGNOSIS — M546 Pain in thoracic spine: Secondary | ICD-10-CM | POA: Diagnosis not present

## 2019-05-01 DIAGNOSIS — M545 Low back pain: Secondary | ICD-10-CM | POA: Diagnosis not present

## 2019-05-15 ENCOUNTER — Telehealth (INDEPENDENT_AMBULATORY_CARE_PROVIDER_SITE_OTHER): Payer: Medicare PPO | Admitting: Adult Health

## 2019-05-15 ENCOUNTER — Encounter: Payer: Self-pay | Admitting: Adult Health

## 2019-05-15 VITALS — Ht 65.0 in | Wt 213.0 lb

## 2019-05-15 DIAGNOSIS — I5032 Chronic diastolic (congestive) heart failure: Secondary | ICD-10-CM

## 2019-05-15 DIAGNOSIS — I10 Essential (primary) hypertension: Secondary | ICD-10-CM

## 2019-05-15 DIAGNOSIS — E782 Mixed hyperlipidemia: Secondary | ICD-10-CM

## 2019-05-15 NOTE — Progress Notes (Signed)
Virtual Visit via Telephone Note   This visit type was conducted due to national recommendations for restrictions regarding the COVID-19 Pandemic (e.g. social distancing) in an effort to limit this patient's exposure and mitigate transmission in our community.  Due to her co-morbid illnesses, this patient is at least at moderate risk for complications without adequate follow up.  This format is felt to be most appropriate for this patient at this time.  The patient did not have access to video technology/had technical difficulties with video requiring transitioning to audio format only (telephone).  All issues noted in this document were discussed and addressed.  No physical exam could be performed with this format.  Please refer to the patient's chart for her  consent to telehealth for [facility].   Date:  05/15/2019   ID:  Deanna Gomez, DOB 31-Jul-1945, MRN [id number]  Patient Location: Home Provider Location: Home  PCP:  Unk Pinto, MD  Cardiologist:  [place] Electrophysiologist:  None   Evaluation Performed:  Follow-Up Visit  Chief Complaint: Hypertension follow up  History of Present Illness:    Deanna Gomez is a 74 y.o. female for ongoing assessment and management of hypertension, history of dyspnea on exertion, chronic lower extremity edema, and hyperlipidemia.  Most recent echocardiogram in March 2019 revealed normal LV systolic function with grade 2 diastolic dysfunction.  She was last seen 10/02/2018 and was doing well but under a lot of stress in caring for her husband with end stage CHF. She is not experiencing any chest pain. She speaks at length about all she has to do to take care of her husband.   She denies any cardiac complaints.   The patient does not have symptoms concerning for COVID-19 infection (fever, chills, cough, or new shortness of breath). She and her husband have had both of their COVID vaccines.    Past Medical History:   Diagnosis Date  . Abdominal aortic atherosclerosis ([place]) 12/26/2016   Noted incidentally on 12/24/2016 CT  . Gastric ulcer 11/19/2014  . Hepatic steatosis 12/26/2016   Noted on CT 12/24/2016 - moderate degree without distinct lesions  . Hepatitis C   . HTN (hypertension)   . Hyperlipidemia   . Obesity   . Pre-diabetes   . Scoliosis   . Tobacco use    stopped 6 years ago.  . Vitamin D deficiency    Past Surgical History:  Procedure Laterality Date  . BACK SURGERY  1986   x 3  . BREAST RECONSTRUCTION     x 2  . CATARACT EXTRACTION     Bilateral     Current Meds  Medication Sig  . ALPRAZolam (XANAX) 0.5 MG tablet Take 1/2-1 tablet 2 - 3 x /day ONLY if needed for Anxiety Attack or Sleep &  limit to 5 days /week to avoid addiction  . aspirin EC 81 MG tablet Take 81 mg by mouth daily.  Marland Kitchen atenolol (TENORMIN) 100 MG tablet TAKE ONE TABLET BY MOUTH DAILY FOR BLOOD PRESSURE  . Cholecalciferol (VITAMIN D3 PO) Take 5,000 Units by mouth daily.   Marland Kitchen losartan (COZAAR) 100 MG tablet TAKE ONE TABLET BY MOUTH DAILY  . Magnesium 500 MG TABS Take 1 tablet (500 mg total) by mouth 2 (two) times daily with a meal.  . nystatin (NYSTATIN) powder Apply daily after a shower as needed  . polyethylene glycol powder (GLYCOLAX/MIRALAX) 17 GM/SCOOP powder Take 1 Container by mouth once.  . RESTASIS 0.05 % ophthalmic emulsion Place 1  drop into both eyes daily.  . rosuvastatin (CRESTOR) 40 MG tablet Take 1 tablet (40 mg total) by mouth daily.  Marland Kitchen senna (SENOKOT) 8.6 MG tablet Take 1 tablet by mouth daily.  . [DISCONTINUED] hydrochlorothiazide (HYDRODIURIL) 25 MG tablet Take 1 tablet Daily for BP & Fluid Retention / Ankle Swelling     Allergies:   Fentanyl   Social History   Tobacco Use  . Smoking status: Former Smoker    Packs/day: 1.00    Years: 30.00    Pack years: 30.00    Types: Cigarettes    Quit date: 08/28/2004    Years since quitting: 14.7  . Smokeless tobacco: Never Used  . Tobacco  comment: QUIT SIX YEARS AGO  Substance Use Topics  . Alcohol use: Yes    Alcohol/week: 14.0 standard drinks    Types: 14 Standard drinks or equivalent per week    Comment: Drinks 2-3 glasses wine nightly x 20 years  . Drug use: No     Family Hx: The patient's family history includes Coronary artery disease in her brother; Hypertension in her mother.  ROS:   Please see the history of present illness.    All other systems reviewed and are negative.   Prior CV studies:   The following studies were reviewed today:  Echocardiogram 05/18/2017 Left ventricle: The cavity size was normal. There was severe septal hypertrophy with otherwise moderate concentric hypertrophy. Systolic function was normal. The estimated ejection fraction was in the range of 60% to 65%. Wall motion was normal; there were no regional wall motion abnormalities. Features are consistent with a pseudonormal left ventricular filling pattern, with concomitant abnormal relaxation and increased filling pressure (grade 2 diastolic dysfunction). Doppler parameters are consistent with high ventricular filling pressure. - Aortic valve: Transvalvular velocity was within the normal range. There was no stenosis. There was mild to moderate regurgitation. Regurgitation pressure half-time: 472 ms. - Aorta: Ascending aortic diameter: 40 mm (S). - Ascending aorta: The ascending aorta was mildly dilated. - Mitral valve: Transvalvular velocity was within the normal range. There was no evidence for stenosis. There was trivial regurgitation. - Left atrium: The atrium was moderately dilated. - Right ventricle: The cavity size was normal. Wall thickness was normal. Systolic function was normal. - Tricuspid valve: There was trivial regurgitation. - Pulmonary arteries: Systolic pressure was mildly increased. PA peak pressure: 44 mm Hg (S). - Pericardium, extracardiac: A trivial pericardial effusion  was identified.   Labs/Other Tests and Data Reviewed:    EKG:  No ECG reviewed.  Recent Labs: 04/14/2019: ALT 16; Hemoglobin 13.1; Magnesium 2.3; Platelets 263; TSH 2.04 04/28/2019: BUN 9; Creat 0.91; Potassium 4.7; Sodium 133   Recent Lipid Panel Lab Results  Component Value Date/Time   CHOL 184 04/14/2019 10:21 AM   TRIG 91 04/14/2019 10:21 AM   HDL 96 04/14/2019 10:21 AM   CHOLHDL 1.9 04/14/2019 10:21 AM   LDLCALC 71 04/14/2019 10:21 AM    Wt Readings from Last 3 Encounters:  05/15/19 213 lb (96.6 kg)  04/28/19 217 lb 6.4 oz (98.6 kg)  04/14/19 213 lb 12.8 oz (97 kg)     Objective:    Vital Signs:  Ht 5\' 5"  (1.651 m)   Wt 213 lb (96.6 kg)   BMI 35.45 kg/m    VITAL SIGNS:  reviewed  ASSESSMENT & PLAN:    1. Hypertension: Currently controlled. No changes on her medication regimen for now. Labs are followed by PCP  2. Hyperlipidemia:  Continue rosuvastatin  as directed Labs followed by PCP  3. Chronic Diastolic Dysfunction: Grade II per recent echo. Will need follow up echo in 2-3 years unless she becomes symptomatic.     COVID-19 Education: The signs and symptoms of COVID-19 were discussed with the patient and how to seek care for testing (follow up with PCP or arrange E-visit). The importance of social distancing was discussed today.  Time:   Today, I have spent 20 minutes with the patient with telehealth technology discussing the above problems.     Medication Adjustments/Labs and Tests Ordered: Current medicines are reviewed at length with the patient today.  Concerns regarding medicines are outlined above.   Tests Ordered: No orders of the defined types were placed in this encounter.   Medication Changes: No orders of the defined types were placed in this encounter.   Disposition:  Follow up 6 months   Signed, Bettey Mare. Liborio Nixon, ANP, AACC  05/15/2019 12:10 PM    [facility]

## 2019-05-15 NOTE — Patient Instructions (Signed)
Medication Instructions:  Continue current medications  *If you need a refill on your cardiac medications before your next appointment, please call your pharmacy*   Lab Work: None Ordered  Testing/Procedures: None Ordered   Follow-Up: At [facility], you and your health needs are our priority.  As part of our continuing mission to provide you with exceptional heart care, we have created designated Provider Care Teams.  These Care Teams include your primary Cardiologist (physician) and Advanced Practice Providers (APPs -  Physician Assistants and Nurse Practitioners) who all work together to provide you with the care you need, when you need it.  We recommend signing up for the patient portal called "MyChart".  Sign up information is provided on this After Visit Summary.  MyChart is used to connect with patients for Virtual Visits (Telemedicine).  Patients are able to view lab/test results, encounter notes, upcoming appointments, etc.  Non-urgent messages can be sent to your provider as well.   To learn more about what you can do with MyChart, go to [email or web address].    Your next appointment:   6 month(s)  The format for your next appointment:   In Person  Provider:   You may see Dr Clifton James or one of the following Advanced Practice Providers on your designated Care Team:    Ronie Spies, PA-C  Jacolyn Reedy, PA-C

## 2019-05-21 DIAGNOSIS — M546 Pain in thoracic spine: Secondary | ICD-10-CM | POA: Diagnosis not present

## 2019-05-21 DIAGNOSIS — M545 Low back pain: Secondary | ICD-10-CM | POA: Diagnosis not present

## 2019-06-05 DIAGNOSIS — M545 Low back pain: Secondary | ICD-10-CM | POA: Diagnosis not present

## 2019-06-05 DIAGNOSIS — M546 Pain in thoracic spine: Secondary | ICD-10-CM | POA: Diagnosis not present

## 2019-06-25 DIAGNOSIS — M545 Low back pain: Secondary | ICD-10-CM | POA: Diagnosis not present

## 2019-06-25 DIAGNOSIS — M546 Pain in thoracic spine: Secondary | ICD-10-CM | POA: Diagnosis not present

## 2019-06-26 ENCOUNTER — Other Ambulatory Visit: Payer: Self-pay | Admitting: Adult Health

## 2019-07-14 DIAGNOSIS — M546 Pain in thoracic spine: Secondary | ICD-10-CM | POA: Diagnosis not present

## 2019-07-14 DIAGNOSIS — M545 Low back pain: Secondary | ICD-10-CM | POA: Diagnosis not present

## 2019-07-16 NOTE — Progress Notes (Signed)
Patient ID: Deanna Gomez, female   DOB: Jul 23, 1945, 74 y.o.   MRN: [id number]   MEDICARE ANNUAL WELLNESS VISIT AND 3 MONTH  Assessment:   Encounter for Medicare annual wellness exam 1 year  Atherosclerosis of aorta Per CT 2018 Control blood pressure, cholesterol, glucose, increase exercise.   Diastolic CHF Grade 2 dysfunction, Weight stable/down Followed by cardiology  Essential hypertension - continue medications, DASH diet, exercise and monitor at home. Call if greater than 130/80.   Hepatitis C virus infection without hepatic coma, unspecified chronicity S/p treatment, in remission -     CMP/GFR at routine OVs  Hepatic steatosis Weight loss advised, avoid alcohol/tylenol, will monitor LFTs  Hyperlipidemia -continue medications, check lipids, decrease fatty foods, increase activity.  -     Lipid panel  Other abnormal glucose (hx of prediabetes) Discussed general issues about diabetes pathophysiology and management., Educational material distributed., Suggested low cholesterol diet., Encouraged aerobic exercise., Discussed foot care., Reminded to get yearly retinal exam.  Medication management -     Magnesium  Obesity Long discussion about weight loss, diet, and exercise Recommended diet heavy in fruits and veggies and low in animal meats, cheeses, and dairy products, appropriate calorie intake Patient will work on getting back on metabolism regulation diet and start working Discussed appropriate weight for height and initial goal (200lb) Follow up at next visit  Vitamin D deficiency At goal at recent check; continue to recommend supplementation for goal of 60-100 Defer vitamin D level  Chronic midline back pain Hx of severe scoliosis with fusion 10+ years ago, sees a provider in [place].   Insomnia Uses xanax PRN good sleep hygiene discussed, increase day time activity, try melatonin or benadryl   History of colon polyps Follow up due August  2021, placed referral to Dr. Ewing Schlein per patient preference  Osteopenia Osteopenia- get dexa, continue Vit D and Ca, weight bearing exercises    Future Appointments  Date Time Provider Department Center  10/23/2019 11:30 AM Lucky Cowboy, MD [facility] None  04/21/2020 11:00 AM Lucky Cowboy, MD [facility] None     Plan:   During the course of the visit the patient was educated and counseled about appropriate screening and preventive services including:    Pneumococcal vaccine   Influenza vaccine  Td vaccine  Screening electrocardiogram  Bone densitometry screening  Colorectal cancer screening  Diabetes screening  Glaucoma screening  Nutrition counseling   Advanced directives: requested   Subjective:   Deanna Gomez is a 74 y.o. female who presents for Medicare Annual Wellness Visit and follow up.   Other problems include Chronic LBP from severe thoracolumbar scoliosis s/p DDD fusion surgery x 3. [facility]erred to [facility] provider [facility] found [facility] to [facility], has stopped since covid but continues home exercises and doing well.   She also was treated for Hepatitis C (1987).   She has xanax 0.25-0.5 mg which she takes only if unable to fall asleep after waking up in the middle of the night or if very anxious due to storm. Typically takes 0.25 mg, 3-4 days per week.   BMI is Body mass index is 35.19 kg/m., [facility], doing PT and stretching/strenght training at home daily, is working on diet/portions, Weight goal <200 lb.  Wt Readings from Last 3 Encounters:  07/17/19 218 lb (98.9 kg)  05/15/19 213 lb (96.6 kg)  04/28/19 217 lb 6.4 oz (98.6 kg)  She has had elevated blood pressure since 1996. Her blood pressure has been controlled at home, reports somewhat labile & today their BP is BP: 122/82 . She denies dizziness, CP, dyspnea, edema.  She does not workout. She denies chest pain, shortness of breath,  dizziness.  She has aortic atherosclerosis per CT 2018. She is following with cardiology after ECHO in 2019 ordered due to exertional dyspnea demonstrated grade 2 diastolic dysfunction; BP and symptoms improved recently.   She denies dyspnea on exertion, orthopnea, paroxysmal nocturnal dyspnea and edema. Positive for none.  She is on cholesterol medication (rosuvastatin 40 mg daily) and denies myalgias. Her cholesterol is at goal. The cholesterol last visit was:  Lab Results  Component Value Date   CHOL 184 04/14/2019   HDL 96 04/14/2019   LDLCALC 71 04/14/2019   TRIG 91 04/14/2019   CHOLHDL 1.9 04/14/2019   She has had prediabetes since 2012. She has been working on diet and exercise for glucose management, and denies foot ulcerations, hyperglycemia, hypoglycemia , increased appetite, nausea, paresthesia of the feet, polydipsia, polyuria, visual disturbances, vomiting and weight loss. Last A1C in the office was:  Lab Results  Component Value Date   HGBA1C 5.8 (H) 04/14/2019    Last GFR:  Lab Results  Component Value Date   [facility] 62 04/28/2019   Patient is on Vitamin D supplement.   Lab Results  Component Value Date   VD25OH 64 04/14/2019        Medication Review: Current Outpatient Medications on File Prior to Visit  Medication Sig  . ALPRAZolam (XANAX) 0.5 MG tablet Take 1/2-1 tablet 2 - 3 x /day ONLY if needed for Anxiety Attack or Sleep &  limit to 5 days /week to avoid addiction  . aspirin EC 81 MG tablet Take 81 mg by mouth daily.  Marland Kitchen atenolol (TENORMIN) 100 MG tablet TAKE ONE TABLET BY MOUTH DAILY FOR BLOOD PRESSURE  . Cholecalciferol (VITAMIN D3 PO) Take 5,000 Units by mouth daily.   Marland Kitchen losartan (COZAAR) 100 MG tablet TAKE ONE TABLET BY MOUTH DAILY  . Magnesium 500 MG TABS Take 1 tablet (500 mg total) by mouth 2 (two) times daily with a meal.  . nystatin (NYSTATIN) powder Apply daily after a shower as needed  . polyethylene glycol powder (GLYCOLAX/MIRALAX) 17  GM/SCOOP powder Take 1 Container by mouth once.  . RESTASIS 0.05 % ophthalmic emulsion Place 1 drop into both eyes daily.  . rosuvastatin (CRESTOR) 40 MG tablet Take 1 tablet (40 mg total) by mouth daily.  Marland Kitchen senna (SENOKOT) 8.6 MG tablet Take 1 tablet by mouth daily.   No current facility-administered medications on file prior to visit.    Current Problems (verified) Patient Active Problem List   Diagnosis Date Noted  . History of colon polyps 07/17/2019  . Osteopenia 07/17/2019  . Constipation 01/06/2019  . Insomnia 07/04/2018  . Diastolic CHF ([facility]) 12/11/2017  . Hepatic steatosis 12/26/2016  . Abdominal aortic atherosclerosis ([facility]) 12/26/2016  . Obesity (BMI 30.0-34.9) 10/15/2014  . Essential hypertension 03/09/2013  . Hyperlipidemia, mixed 03/09/2013  . Abnormal glucose 03/09/2013  . Vitamin D deficiency 03/09/2013  . History of hepatitis C 03/09/2013    Screening Tests Immunization History  Administered Date(s) Administered  . DTaP 03/06/2006  . Influenza Split 11/20/2013, 12/24/2014  . Influenza Whole 12/19/2012  . Influenza, High Dose Seasonal PF 01/04/2017, 12/10/2017  . PFIZER SARS-COV-2 Vaccination 03/27/2019, 04/17/2019  . Pneumococcal Conjugate-13 05/13/2014  . Pneumococcal Polysaccharide-23 03/06/2008, 10/25/2016  .  Typhoid Live 03/06/2004  . Zoster 06/11/2007   Preventative care: Last colonoscopy: 10/2014, due 10/2019, requesting referral to Dr. Ewing Schlein  CXR 06/2016 MGM 11/2018 at [facility]  Echo 2019 - grade 2 diastolic Stress test 2013 PAP 17/7939 DONE DEXA: 2010, 2018 - T -1.7 - ordered  Tdap: 2008, declines Influenza: 2019 Pneumonia: 2018 Prevnar: 2016 Shingles: 2009, not interested in shingrix Covid 19: 2/2, 2021, pfizer  Names of Other Physician/Practitioners you currently use: 1. [facility] here for primary care 2. Dr. Jimmey Ralph, eye doctor, last visit 02/2019 3. Dr. Ardelle Anton, Dentist, last visit 2020,  goes q18m  Patient Care Team: Lucky Cowboy, MD as PCP - General (Internal Medicine) Sharrell Ku, MD as Consulting Physician (Gastroenterology) Teodora Medici, MD as Consulting Physician (Gynecology) Elmon Else, MD as Consulting Physician (Dermatology) [name], DO (Ophthalmology) Rehabilitation, Julien Girt as Physical Therapist (Physical Therapy)   History reviewed: allergies, current medications, past family history, past medical history, past social history, past surgical history and problem list  Allergies Allergies  Allergen Reactions  . Fentanyl Other (See Comments)    Intolerlance    SURGICAL HISTORY She  has a past surgical history that includes Back surgery (1986); Breast reconstruction; and Cataract extraction. FAMILY HISTORY Her family history includes Coronary artery disease in her brother; Hypertension in her mother. SOCIAL HISTORY She  reports that she quit smoking about 14 years ago. Her smoking use included cigarettes. She has a 30.00 pack-year smoking history. She has never used smokeless tobacco. She reports current alcohol use of about 14.0 standard drinks of alcohol per week. She reports that she does not use drugs.  MEDICARE WELLNESS OBJECTIVES: Physical activity: Current Exercise Habits: The patient does not participate in regular exercise at present, Exercise limited by: orthopedic condition(s) Cardiac risk factors: Cardiac Risk Factors include: sedentary lifestyle;obesity (BMI >30kg/m2);advanced age (>53men, >24 women);dyslipidemia;hypertension Depression/mood screen:   Depression screen [facility] 2/9 07/17/2019  Decreased Interest 1  Down, Depressed, Hopeless 1  PHQ - 2 Score 2  Altered sleeping 0  Tired, decreased energy 1  Change in appetite 0  Feeling bad or failure about yourself  0  Trouble concentrating 0  Moving slowly or fidgety/restless 0  Suicidal thoughts 0  PHQ-9 Score 3  Difficult doing work/chores Not difficult at all     ADLs:  In your present state of health, do you have any difficulty performing the following activities: 07/17/2019 04/13/2019  Hearing? N N  Vision? N N  Difficulty concentrating or making decisions? N N  Walking or climbing stairs? N N  Dressing or bathing? N N  Doing errands, shopping? N N  Some recent data might be hidden     Cognitive Testing  Alert? Yes  Normal Appearance?Yes  Oriented to person? Yes  Place? Yes   Time? Yes  Recall of three objects?  Yes  Can perform simple calculations? Yes  Displays appropriate judgment?Yes  Can read the correct time from a watch face?Yes  EOL planning: Does Patient Have a Medical Advance Directive?: Yes Type of Advance Directive: Healthcare Power of Attorney, Living will Does patient want to make changes to medical advance directive?: No - Patient declined Copy of Healthcare Power of Attorney in Chart?: No - copy requested   Objective:     BP 122/82   Pulse 64   Temp (!) 97.3 F (36.3 C)   Ht 5\' 6"  (1.676 m)   Wt 218 lb (98.9 kg)   SpO2 99%   BMI 35.19  kg/m   General Appearance: Well nourished, in no apparent distress. Eyes: PERRLA, EOMs, conjunctiva no swelling or erythema Sinuses: No Frontal/maxillary tenderness ENT/Mouth: Ext aud canals clear, TMs without erythema, bulging. No erythema, swelling, or exudate on post pharynx.  Tonsils not swollen or erythematous. Hearing normal.  Neck: Supple, thyroid normal.  Respiratory: Respiratory effort normal, BS equal bilaterally without rales, rhonchi, wheezing or stridor.  Cardio: RRR with no MRGs. Brisk peripheral pulses without edema.  Abdomen: Soft, + BS.  Non tender, no guarding, rebound, hernias, masses. Lymphatics: Non tender without lymphadenopathy.  Musculoskeletal: Full ROM, 5/5 strength, Normal gait Skin: Warm, dry without rashes, lesions, ecchymosis.  Neuro: Cranial nerves intact. No cerebellar symptoms.  Psych: Awake and oriented X 3, normal affect, Insight and Judgment  appropriate.    Medicare Attestation I have personally reviewed: The patient's medical and social history Their use of alcohol, tobacco or illicit drugs Their current medications and supplements The patient's functional ability including ADLs,fall risks, home safety risks, cognitive, and hearing and visual impairment Diet and physical activities Evidence for depression or mood disorders  The patient's weight, height, BMI, and visual acuity have been recorded in the chart.  I have made referrals, counseling, and provided education to the patient based on review of the above and I have provided the patient with a written personalized care plan for preventive services.    [name], NP   07/17/2019

## 2019-07-17 ENCOUNTER — Other Ambulatory Visit: Payer: Self-pay

## 2019-07-17 ENCOUNTER — Encounter: Payer: Self-pay | Admitting: Adult Health

## 2019-07-17 ENCOUNTER — Ambulatory Visit: Payer: Medicare Other | Admitting: Adult Health

## 2019-07-17 VITALS — BP 122/82 | HR 64 | Temp 97.3°F | Ht 66.0 in | Wt 218.0 lb

## 2019-07-17 DIAGNOSIS — G4700 Insomnia, unspecified: Secondary | ICD-10-CM

## 2019-07-17 DIAGNOSIS — I700 Atherosclerosis of aorta: Secondary | ICD-10-CM

## 2019-07-17 DIAGNOSIS — R7309 Other abnormal glucose: Secondary | ICD-10-CM | POA: Diagnosis not present

## 2019-07-17 DIAGNOSIS — E669 Obesity, unspecified: Secondary | ICD-10-CM

## 2019-07-17 DIAGNOSIS — Z0001 Encounter for general adult medical examination with abnormal findings: Secondary | ICD-10-CM | POA: Diagnosis not present

## 2019-07-17 DIAGNOSIS — E782 Mixed hyperlipidemia: Secondary | ICD-10-CM

## 2019-07-17 DIAGNOSIS — K5904 Chronic idiopathic constipation: Secondary | ICD-10-CM | POA: Diagnosis not present

## 2019-07-17 DIAGNOSIS — I10 Essential (primary) hypertension: Secondary | ICD-10-CM | POA: Diagnosis not present

## 2019-07-17 DIAGNOSIS — Z8619 Personal history of other infectious and parasitic diseases: Secondary | ICD-10-CM

## 2019-07-17 DIAGNOSIS — M8580 Other specified disorders of bone density and structure, unspecified site: Secondary | ICD-10-CM | POA: Insufficient documentation

## 2019-07-17 DIAGNOSIS — K760 Fatty (change of) liver, not elsewhere classified: Secondary | ICD-10-CM

## 2019-07-17 DIAGNOSIS — Z0000 Encounter for general adult medical examination without abnormal findings: Secondary | ICD-10-CM

## 2019-07-17 DIAGNOSIS — I5032 Chronic diastolic (congestive) heart failure: Secondary | ICD-10-CM

## 2019-07-17 DIAGNOSIS — R6889 Other general symptoms and signs: Secondary | ICD-10-CM

## 2019-07-17 DIAGNOSIS — E559 Vitamin D deficiency, unspecified: Secondary | ICD-10-CM

## 2019-07-17 DIAGNOSIS — Z86010 Personal history of colonic polyps: Secondary | ICD-10-CM | POA: Insufficient documentation

## 2019-07-17 DIAGNOSIS — Z1211 Encounter for screening for malignant neoplasm of colon: Secondary | ICD-10-CM

## 2019-07-17 NOTE — Patient Instructions (Addendum)
Deanna Gomez , Thank you for taking time to come for your Medicare Wellness Visit. I appreciate your ongoing commitment to your health goals. Please review the following plan we discussed and let me know if I can assist you in the future.   These are the goals we discussed: Goals    . Blood Pressure < 130/80    . HEMOGLOBIN A1C < 5.6    . LDL CALC < 100    . Weight (lb) < 200 lb (90.7 kg)       This is a list of the screening recommended for you and due dates:  Health Maintenance  Topic Date Due  . Tetanus Vaccine  03/06/2016  . Flu Shot  10/05/2019  . DEXA scan (bone density measurement)  Completed  . COVID-19 Vaccine  Completed  .  Hepatitis C: One time screening is recommended by Center for Disease Control  (CDC) for  adults born from [birth year] through 1967.   Completed  . Pneumonia vaccines  Completed    Please add a daily soluble fiber (citrucel/benefiber) -   Make sure you are drinking 4-5 bottles daily (65+ fluid ounces daily)  Increase daily activity/exercise - aim to walk 30 min daily   Follow up colonoscopy due in August - referral placed to Dr. Watt Climes   Try peppermint oil in mask or on upper lip if going to area with strong offensive odors       Constipation, Adult Constipation is when a person has fewer bowel movements in a week than normal, has difficulty having a bowel movement, or has stools that are dry, hard, or larger than normal. Constipation may be caused by an underlying condition. It may become worse with age if a person takes certain medicines and does not take in enough fluids. Follow these instructions at home: Eating and drinking   Eat foods that have a lot of fiber, such as fresh fruits and vegetables, whole grains, and beans.  Limit foods that are high in fat, low in fiber, or overly processed, such as french fries, hamburgers, cookies, candies, and soda.  Drink enough fluid to keep your urine clear or pale yellow. General  instructions  Exercise regularly or as told by your health care provider.  Go to the restroom when you have the urge to go. Do not hold it in.  Take over-the-counter and prescription medicines only as told by your health care provider. These include any fiber supplements.  Practice pelvic floor retraining exercises, such as deep breathing while relaxing the lower abdomen and pelvic floor relaxation during bowel movements.  Watch your condition for any changes.  Keep all follow-up visits as told by your health care provider. This is important. Contact a health care provider if:  You have pain that gets worse.  You have a fever.  You do not have a bowel movement after 4 days.  You vomit.  You are not hungry.  You lose weight.  You are bleeding from the anus.  You have thin, pencil-like stools. Get help right away if:  You have a fever and your symptoms suddenly get worse.  You leak stool or have blood in your stool.  Your abdomen is bloated.  You have severe pain in your abdomen.  You feel dizzy or you faint. This information is not intended to replace advice given to you by your health care provider. Make sure you discuss any questions you have with your health care provider. Document Revised: 02/02/2017 Document  Reviewed: 08/11/2015 Elsevier Patient Education  [employer].

## 2019-07-18 LAB — COMPLETE METABOLIC PANEL WITH GFR
AG Ratio: 1.7 (calc) (ref 1.0–2.5)
ALT: 17 U/L (ref 6–29)
AST: 24 U/L (ref 10–35)
Albumin: 4.7 g/dL (ref 3.6–5.1)
Alkaline phosphatase (APISO): 71 U/L (ref 37–153)
BUN: 16 mg/dL (ref 7–25)
CO2: 30 mmol/L (ref 20–32)
Calcium: 10.2 mg/dL (ref 8.6–10.4)
Chloride: 92 mmol/L — ABNORMAL LOW (ref 98–110)
Creat: 0.72 mg/dL (ref 0.60–0.93)
GFR, Est African American: 94 mL/min/{1.73_m2} (ref >=60)
GFR, Est Non African American: 81 mL/min/{1.73_m2} (ref >=60)
Globulin: 2.7 g/dL (calc) (ref 1.9–3.7)
Glucose, Bld: 102 mg/dL — ABNORMAL HIGH (ref 65–99)
Potassium: 4.7 mmol/L (ref 3.5–5.3)
Sodium: 133 mmol/L — ABNORMAL LOW (ref 135–146)
Total Bilirubin: 0.6 mg/dL (ref 0.2–1.2)
Total Protein: 7.4 g/dL (ref 6.1–8.1)

## 2019-07-18 LAB — CBC WITH DIFFERENTIAL/PLATELET
Absolute Monocytes: 707 cells/uL (ref 200–950)
Basophils Absolute: 37 cells/uL (ref 0–200)
Basophils Relative: 0.4 %
Eosinophils Absolute: 37 cells/uL (ref 15–500)
Eosinophils Relative: 0.4 %
HCT: 41.2 % (ref 35.0–45.0)
Hemoglobin: 14.1 g/dL (ref 11.7–15.5)
Lymphs Abs: 1460 cells/uL (ref 850–3900)
MCH: 32.6 pg (ref 27.0–33.0)
MCHC: 34.2 g/dL (ref 32.0–36.0)
MCV: 95.2 fL (ref 80.0–100.0)
MPV: 9.8 fL (ref 7.5–12.5)
Monocytes Relative: 7.6 %
Neutro Abs: 7059 cells/uL (ref 1500–7800)
Neutrophils Relative %: 75.9 %
Platelets: 269 10*3/uL (ref 140–400)
RBC: 4.33 10*6/uL (ref 3.80–5.10)
RDW: 12.7 % (ref 11.0–15.0)
Total Lymphocyte: 15.7 %
WBC: 9.3 10*3/uL (ref 3.8–10.8)

## 2019-07-18 LAB — LIPID PANEL
Cholesterol: 218 mg/dL — ABNORMAL HIGH (ref <200)
HDL: 103 mg/dL (ref >=50)
LDL Cholesterol (Calc): 94 mg/dL (calc)
Non-HDL Cholesterol (Calc): 115 mg/dL (calc) (ref <130)
Total CHOL/HDL Ratio: 2.1 (calc) (ref <5.0)
Triglycerides: 114 mg/dL (ref <150)

## 2019-07-18 LAB — MAGNESIUM: Magnesium: 2 mg/dL (ref 1.5–2.5)

## 2019-07-18 LAB — TSH: TSH: 1.81 mIU/L (ref 0.40–4.50)

## 2019-08-04 ENCOUNTER — Other Ambulatory Visit: Payer: Self-pay | Admitting: Adult Health

## 2019-08-18 ENCOUNTER — Other Ambulatory Visit: Payer: Self-pay | Admitting: Internal Medicine

## 2019-08-18 DIAGNOSIS — G4700 Insomnia, unspecified: Secondary | ICD-10-CM

## 2019-08-19 ENCOUNTER — Other Ambulatory Visit: Payer: Self-pay

## 2019-08-19 DIAGNOSIS — I10 Essential (primary) hypertension: Secondary | ICD-10-CM

## 2019-08-19 MED ORDER — ROSUVASTATIN CALCIUM 40 MG PO TABS: ORAL_TABLET | ORAL | 0 refills | Status: DC

## 2019-08-19 MED ORDER — ATENOLOL 100 MG PO TABS: ORAL_TABLET | ORAL | 0 refills | Status: DC

## 2019-08-28 DIAGNOSIS — Z86018 Personal history of other benign neoplasm: Secondary | ICD-10-CM | POA: Diagnosis not present

## 2019-08-28 DIAGNOSIS — L570 Actinic keratosis: Secondary | ICD-10-CM | POA: Diagnosis not present

## 2019-08-28 DIAGNOSIS — L578 Other skin changes due to chronic exposure to nonionizing radiation: Secondary | ICD-10-CM | POA: Diagnosis not present

## 2019-08-28 DIAGNOSIS — L814 Other melanin hyperpigmentation: Secondary | ICD-10-CM | POA: Diagnosis not present

## 2019-08-28 DIAGNOSIS — Z85828 Personal history of other malignant neoplasm of skin: Secondary | ICD-10-CM | POA: Diagnosis not present

## 2019-08-28 DIAGNOSIS — D2272 Melanocytic nevi of left lower limb, including hip: Secondary | ICD-10-CM | POA: Diagnosis not present

## 2019-08-28 DIAGNOSIS — D485 Neoplasm of uncertain behavior of skin: Secondary | ICD-10-CM | POA: Diagnosis not present

## 2019-08-28 DIAGNOSIS — D2230 Melanocytic nevi of unspecified part of face: Secondary | ICD-10-CM | POA: Diagnosis not present

## 2019-08-28 DIAGNOSIS — D225 Melanocytic nevi of trunk: Secondary | ICD-10-CM | POA: Diagnosis not present

## 2019-09-09 DIAGNOSIS — M546 Pain in thoracic spine: Secondary | ICD-10-CM | POA: Diagnosis not present

## 2019-09-09 DIAGNOSIS — M545 Low back pain: Secondary | ICD-10-CM | POA: Diagnosis not present

## 2019-09-23 DIAGNOSIS — M545 Low back pain: Secondary | ICD-10-CM | POA: Diagnosis not present

## 2019-09-23 DIAGNOSIS — M546 Pain in thoracic spine: Secondary | ICD-10-CM | POA: Diagnosis not present

## 2019-10-06 DIAGNOSIS — M545 Low back pain: Secondary | ICD-10-CM | POA: Diagnosis not present

## 2019-10-06 DIAGNOSIS — M546 Pain in thoracic spine: Secondary | ICD-10-CM | POA: Diagnosis not present

## 2019-10-07 ENCOUNTER — Other Ambulatory Visit: Payer: Self-pay | Admitting: Adult Health

## 2019-10-15 DIAGNOSIS — M545 Low back pain: Secondary | ICD-10-CM | POA: Diagnosis not present

## 2019-10-15 DIAGNOSIS — M546 Pain in thoracic spine: Secondary | ICD-10-CM | POA: Diagnosis not present

## 2019-10-23 ENCOUNTER — Encounter: Payer: Self-pay | Admitting: Internal Medicine

## 2019-10-23 ENCOUNTER — Other Ambulatory Visit: Payer: Self-pay

## 2019-10-23 ENCOUNTER — Ambulatory Visit: Payer: Medicare PPO | Admitting: Internal Medicine

## 2019-10-23 VITALS — BP 120/76 | HR 76 | Temp 97.6°F | Resp 16 | Ht 65.75 in | Wt 218.6 lb

## 2019-10-23 DIAGNOSIS — Z79899 Other long term (current) drug therapy: Secondary | ICD-10-CM

## 2019-10-23 DIAGNOSIS — E559 Vitamin D deficiency, unspecified: Secondary | ICD-10-CM

## 2019-10-23 DIAGNOSIS — E782 Mixed hyperlipidemia: Secondary | ICD-10-CM | POA: Diagnosis not present

## 2019-10-23 DIAGNOSIS — I10 Essential (primary) hypertension: Secondary | ICD-10-CM | POA: Diagnosis not present

## 2019-10-23 DIAGNOSIS — R7309 Other abnormal glucose: Secondary | ICD-10-CM | POA: Diagnosis not present

## 2019-10-23 NOTE — Patient Instructions (Signed)

## 2019-10-23 NOTE — Progress Notes (Signed)
History of Present Illness:       This very nice 74 y.o.  MWF presents for 6 month follow up with HTN, HLD, Pre-Diabetes and Vitamin D Deficiency. Patient has Chronic LBPfromsevere thoracolumbar scoliosis and  DDD lumbar fusion surgeryx3.      Patient is treated for HTN (1996)  & BP has been controlled at home. Today's BP is at goal -  120/76.  Neg Stress Myoview in 2013. Patient has had no complaints of any cardiac type chest pain, palpitations, dyspnea / orthopnea / PND, dizziness, claudication, or dependent edema.      Hyperlipidemia is controlled with diet & Rosuvastatin. Patient denies myalgias or other med SE's. Last Lipids were at goal:  Lab Results  Component Value Date   CHOL 218 (H) 07/17/2019   HDL 103 07/17/2019   LDLCALC 94 07/17/2019   TRIG 114 07/17/2019   CHOLHDL 2.1 07/17/2019    Also, the patient has moderate Obesity (BMI 34.7+) and consequent  PreDiabetes  (A1c 5.8%/2016)and has had no symptoms of reactive hypoglycemia, diabetic polys, paresthesias or visual blurring.  Last A1c was near goal:  Lab Results  Component Value Date   HGBA1C 5.8 (H) 04/14/2019           Further, the patient also has history of Vitamin D Deficiency ("12" / 2008) and supplements vitamin D without any suspected side-effects. Last vitamin D was at goal:  Lab Results  Component Value Date   VD25OH 64 04/14/2019    Current Outpatient Medications on File Prior to Visit  Medication Sig  . ALPRAZolam (XANAX) 0.5 MG tablet TAKE ONE-HALF TO ONE TABLET BY MOUTH TWO TO THREE TIMES PER DAY IF NEEDED FOR ANXIETY OR SLEEP.  LIMIT 5 DAYS PER WEEK.  Marland Kitchen aspirin EC 81 MG tablet Take 81 mg by mouth daily.  Marland Kitchen atenolol (TENORMIN) 100 MG tablet Take one tablet daily for blood pressure.  . Cholecalciferol (VITAMIN D3 PO) Take 5,000 Units by mouth daily.   Marland Kitchen losartan (COZAAR) 100 MG tablet TAKE ONE TABLET BY MOUTH DAILY  . Magnesium 500 MG TABS Take 1 tablet (500 mg total) by mouth 2 (two)  times daily with a meal.  . nystatin (NYSTATIN) powder Apply daily after a shower as needed  . RESTASIS 0.05 % ophthalmic emulsion Place 1 drop into both eyes daily.  . rosuvastatin (CRESTOR) 40 MG tablet Take 1 tablet Daily for Cholesterol  . senna (SENOKOT) 8.6 MG tablet Take 1 tablet by mouth daily.   No current facility-administered medications on file prior to visit.    Allergies  Allergen Reactions  . Fentanyl Other (See Comments)    Intolerlance    PMHx:   Past Medical History:  Diagnosis Date  . Abdominal aortic atherosclerosis (HCC) 12/26/2016   Noted incidentally on 12/24/2016 CT  . Gastric ulcer 11/19/2014  . Hepatic steatosis 12/26/2016   Noted on CT 12/24/2016 - moderate degree without distinct lesions  . Hepatitis C   . HTN (hypertension)   . Hyperlipidemia   . Obesity   . Pre-diabetes   . Scoliosis   . Tobacco use    stopped 6 years ago.  . Vitamin D deficiency     Immunization History  Administered Date(s) Administered  . DTaP 03/06/2006  . Influenza Split 11/20/2013, 12/24/2014  . Influenza Whole 12/19/2012  . Influenza, High Dose Seasonal PF 01/04/2017, 12/10/2017  . PFIZER SARS-COV-2 Vaccination 03/27/2019, 04/17/2019  . Pneumococcal Conjugate-13 05/13/2014  . Pneumococcal Polysaccharide-23  03/06/2008, 10/25/2016  . Typhoid Live 03/06/2004  . Zoster 06/11/2007    Past Surgical History:  Procedure Laterality Date  . BACK SURGERY  1986   x 3  . BREAST RECONSTRUCTION     x 2  . CATARACT EXTRACTION     Bilateral    FHx:    Reviewed / unchanged  SHx:    Reviewed / unchanged   Systems Review:  Constitutional: Denies fever, chills, wt changes, headaches, insomnia, fatigue, night sweats, change in appetite. Eyes: Denies redness, blurred vision, diplopia, discharge, itchy, watery eyes.  ENT: Denies discharge, congestion, post nasal drip, epistaxis, sore throat, earache, hearing loss, dental pain, tinnitus, vertigo, sinus pain, snoring.  CV:  Denies chest pain, palpitations, irregular heartbeat, syncope, dyspnea, diaphoresis, orthopnea, PND, claudication or edema. Respiratory: denies cough, dyspnea, DOE, pleurisy, hoarseness, laryngitis, wheezing.  Gastrointestinal: Denies dysphagia, odynophagia, heartburn, reflux, water brash, abdominal pain or cramps, nausea, vomiting, bloating, diarrhea, constipation, hematemesis, melena, hematochezia  or hemorrhoids. Genitourinary: Denies dysuria, frequency, urgency, nocturia, hesitancy, discharge, hematuria or flank pain. Musculoskeletal: Denies arthralgias, myalgias, stiffness, jt. swelling, pain, limping or strain/sprain.  Skin: Denies pruritus, rash, hives, warts, acne, eczema or change in skin lesion(s). Neuro: No weakness, tremor, incoordination, spasms, paresthesia or pain. Psychiatric: Denies confusion, memory loss or sensory loss. Endo: Denies change in weight, skin or hair change.  Heme/Lymph: No excessive bleeding, bruising or enlarged lymph nodes.  Physical Exam  BP 120/76   Pulse 76   Temp 97.6 F (36.4 C)   Resp 16   Ht 5' 5.75" (1.67 m)   Wt 218 lb 9.6 oz (99.2 kg)   BMI 35.55 kg/m   Appears  over nourished, well groomed  and in no distress.  Eyes: PERRLA, EOMs, conjunctiva no swelling or erythema. Sinuses: No frontal/maxillary tenderness ENT/Mouth: EAC's clear, TM's nl w/o erythema, bulging. Nares clear w/o erythema, swelling, exudates. Oropharynx clear without erythema or exudates. Oral hygiene is good. Tongue normal, non obstructing. Hearing intact.  Neck: Supple. Thyroid not palpable. Car 2+/2+ without bruits, nodes or JVD. Chest: Respirations nl with BS clear & equal w/o rales, rhonchi, wheezing or stridor.  Cor: Heart sounds normal w/ regular rate and rhythm without sig. murmurs, gallops, clicks or rubs. Peripheral pulses normal and equal  without edema.  Abdomen: Soft, obese  & bowel sounds normal. Non-tender w/o guarding, rebound, hernias, masses or organomegaly.   Lymphatics: Unremarkable.  Musculoskeletal: Full ROM all peripheral extremities, joint stability, 5/5 strength and normal gait.  Skin: Warm, dry without exposed rashes, lesions or ecchymosis apparent.  Neuro: Cranial nerves intact, reflexes equal bilaterally. Sensory-motor testing grossly intact. Tendon reflexes grossly intact.  Pysch: Alert & oriented x 3.  Insight and judgement nl & appropriate. No ideations.  Assessment and Plan:  1. Essential hypertension  - Continue medication, monitor blood pressure at home.  - Continue DASH diet.  Reminder to go to the ER if any CP,  SOB, nausea, dizziness, severe HA, changes vision/speech.  - CBC with Differential/Platelet - COMPLETE METABOLIC PANEL WITH GFR - Magnesium - TSH  2. Hyperlipidemia, mixed  - Continue diet/meds, exercise,& lifestyle modifications.  - Continue monitor periodic cholesterol/liver & renal functions   - Lipid panel - TSH  3. Abnormal glucose  - Continue diet, exercise  - Lifestyle modifications.  - Monitor appropriate labs.  - Hemoglobin A1c - Insulin, random  4. Vitamin D deficiency  - Continue supplementation.   -VITAMIN D 25 Hydrox  5. Medication management  - CBC with Differential/Platelet -  COMPLETE METABOLIC PANEL WITH GFR - Magnesium - Lipid panel - TSH - Hemoglobin A1c - Insulin, random - VITAMIN D 25 Hydroxyl        Discussed  regular exercise, BP monitoring, weight control to achieve/maintain BMI less than 25 and discussed med and SE's. Recommended labs to assess and monitor clinical status with further disposition pending results of labs.  I discussed the assessment and treatment plan with the patient. The patient was provided an opportunity to ask questions and all were answered. The patient agreed with the plan and demonstrated an understanding of the instructions.  I provided over 30 minutes of exam, counseling, chart review and  complex critical decision making.   Marinus Maw, MD

## 2019-10-24 LAB — CBC WITH DIFFERENTIAL/PLATELET
Absolute Monocytes: 714 {cells}/uL (ref 200–950)
Basophils Absolute: 34 {cells}/uL (ref 0–200)
Basophils Relative: 0.4 %
Eosinophils Absolute: 17 {cells}/uL (ref 15–500)
Eosinophils Relative: 0.2 %
HCT: 40.4 % (ref 35.0–45.0)
Hemoglobin: 13.6 g/dL (ref 11.7–15.5)
Lymphs Abs: 1260 {cells}/uL (ref 850–3900)
MCH: 32.7 pg (ref 27.0–33.0)
MCHC: 33.7 g/dL (ref 32.0–36.0)
MCV: 97.1 fL (ref 80.0–100.0)
MPV: 9.5 fL (ref 7.5–12.5)
Monocytes Relative: 8.5 %
Neutro Abs: 6376 {cells}/uL (ref 1500–7800)
Neutrophils Relative %: 75.9 %
Platelets: 273 Thousand/uL (ref 140–400)
RBC: 4.16 Million/uL (ref 3.80–5.10)
RDW: 13.1 % (ref 11.0–15.0)
Total Lymphocyte: 15 %
WBC: 8.4 Thousand/uL (ref 3.8–10.8)

## 2019-10-24 LAB — COMPLETE METABOLIC PANEL WITH GFR
AG Ratio: 1.6 (calc) (ref 1.0–2.5)
ALT: 19 U/L (ref 6–29)
AST: 24 U/L (ref 10–35)
Albumin: 4.6 g/dL (ref 3.6–5.1)
Alkaline phosphatase (APISO): 70 U/L (ref 37–153)
BUN: 18 mg/dL (ref 7–25)
CO2: 27 mmol/L (ref 20–32)
Calcium: 10.5 mg/dL — ABNORMAL HIGH (ref 8.6–10.4)
Chloride: 93 mmol/L — ABNORMAL LOW (ref 98–110)
Creat: 0.9 mg/dL (ref 0.60–0.93)
GFR, Est African American: 72 mL/min/{1.73_m2} (ref >=60)
GFR, Est Non African American: 62 mL/min/{1.73_m2} (ref >=60)
Globulin: 2.8 g/dL (calc) (ref 1.9–3.7)
Glucose, Bld: 108 mg/dL — ABNORMAL HIGH (ref 65–99)
Potassium: 4.4 mmol/L (ref 3.5–5.3)
Sodium: 133 mmol/L — ABNORMAL LOW (ref 135–146)
Total Bilirubin: 0.8 mg/dL (ref 0.2–1.2)
Total Protein: 7.4 g/dL (ref 6.1–8.1)

## 2019-10-24 LAB — LIPID PANEL
Cholesterol: 217 mg/dL — ABNORMAL HIGH (ref <200)
HDL: 105 mg/dL (ref >=50)
LDL Cholesterol (Calc): 92 mg/dL
Non-HDL Cholesterol (Calc): 112 mg/dL (ref <130)
Total CHOL/HDL Ratio: 2.1 (calc) (ref <5.0)
Triglycerides: 103 mg/dL (ref <150)

## 2019-10-24 LAB — HEMOGLOBIN A1C
Hgb A1c MFr Bld: 5.5 %{Hb} (ref <5.7)
Mean Plasma Glucose: 111 (calc)
eAG (mmol/L): 6.2 (calc)

## 2019-10-24 LAB — MAGNESIUM: Magnesium: 1.9 mg/dL (ref 1.5–2.5)

## 2019-10-24 LAB — TSH: TSH: 3.24 m[IU]/L (ref 0.40–4.50)

## 2019-10-24 LAB — VITAMIN D 25 HYDROXY (VIT D DEFICIENCY, FRACTURES): Vit D, 25-Hydroxy: 50 ng/mL (ref 30–100)

## 2019-10-24 LAB — INSULIN, RANDOM: Insulin: 17.8 u[IU]/mL

## 2019-10-24 NOTE — Progress Notes (Signed)
============================================================  -   Test results slightly outside the reference range are not unusual. If there is anything important, I will review this with you,  otherwise it is considered normal test values.  If you have further questions,  please do not hesitate to contact me at the office or via My Chart.  ============================================================  -  Total Chol = 217 - is OK since have such a very high Good HDL level   - Low LDL Chol = 92 - Great  ====================================================  -  A1c - Normal - great - No Diabetes ====================================================  -  Vitamin D = 50  is a little low   - Vitamin D goal is between 70-100.   - Please INCREASE your Vitamin D -  5,000 unit caps to 2 caps = 10,000 units /Daily   - It is very important as a natural anti-inflammatory and helping the  immune system protect against viral infections, like the Covid-19    helping hair, skin, and nails, as well as reducing stroke and  heart attack risk.   - It helps your bones and helps with mood.  - It also decreases numerous cancer risks so please take  it as directed.   - Low Vit D is associated with a 200-300% higher risk for  CANCER   and 200-300% higher risk for HEART   ATTACK  &  STROKE.    - It is also associated with higher death rate at younger ages,   autoimmune diseases like Rheumatoid arthritis, Lupus, Multiple Sclerosis.     - Also many other serious conditions, like depression, Alzheimer's  Dementia, infertility, muscle aches, fatigue, fibromyalgia - just to name a few.  ==========================================================  - All Else - CBC - Kidneys - Electrolytes - Liver - Magnesium & Thyroid    - all  Normal / OK ====================================================

## 2019-10-25 ENCOUNTER — Encounter: Payer: Self-pay | Admitting: Internal Medicine

## 2019-10-29 DIAGNOSIS — M545 Low back pain: Secondary | ICD-10-CM | POA: Diagnosis not present

## 2019-10-29 DIAGNOSIS — M546 Pain in thoracic spine: Secondary | ICD-10-CM | POA: Diagnosis not present

## 2019-11-12 DIAGNOSIS — M546 Pain in thoracic spine: Secondary | ICD-10-CM | POA: Diagnosis not present

## 2019-11-12 DIAGNOSIS — M545 Low back pain: Secondary | ICD-10-CM | POA: Diagnosis not present

## 2019-11-13 NOTE — Progress Notes (Signed)
Chief Complaint: Chief Complaint  Patient presents with  . Follow-up    aortic insufficiency   History of Present Illness: 74 yo female with history of aortic insufficiency, HTN, HLD and former tobacco abuse who is here today for cardiac follow up. I saw her as a new patient 08/29/11 for evaluation of dyspnea with exertion.She smoked 1ppd for 30 years. Treadmill stress test in 2013 showed no evidence of ischemia and echo 09/05/11 showed normal LV size and function with mild AI, mild LVH. She has been seen most recently in 2020 by Joni Reining, NP. Echo March 2019 with with normal LV systolic function with grade 2 diastolic dysfunction. Mild to moderate aortic insufficiency.   She is here today for follow up. The patient denies any chest pain, dyspnea, palpitations, lower extremity edema, orthopnea, PND, dizziness, near syncope or syncope.   Primary Care Physician: Lucky Cowboy, MD  Past Medical History:  Diagnosis Date  . Abdominal aortic atherosclerosis (HCC) 12/26/2016   Noted incidentally on 12/24/2016 CT  . Gastric ulcer 11/19/2014  . Hepatic steatosis 12/26/2016   Noted on CT 12/24/2016 - moderate degree without distinct lesions  . Hepatitis C   . HTN (hypertension)   . Hyperlipidemia   . Obesity   . Pre-diabetes   . Scoliosis   . Tobacco use    stopped 6 years ago.  . Vitamin D deficiency     Past Surgical History:  Procedure Laterality Date  . BACK SURGERY  1986   x 3  . BREAST RECONSTRUCTION     x 2  . CATARACT EXTRACTION     Bilateral    Current Outpatient Medications  Medication Sig Dispense Refill  . ALPRAZolam (XANAX) 0.5 MG tablet TAKE ONE-HALF TO ONE TABLET BY MOUTH TWO TO THREE TIMES PER DAY IF NEEDED FOR ANXIETY OR SLEEP.  LIMIT 5 DAYS PER WEEK. 90 tablet 0  . aspirin EC 81 MG tablet Take 81 mg by mouth daily.    . Cholecalciferol (VITAMIN D3 PO) Take 5,000 Units by mouth daily.     Marland Kitchen losartan (COZAAR) 100 MG tablet TAKE ONE TABLET BY MOUTH  DAILY 90 tablet 3  . Magnesium 500 MG TABS Take 1 tablet (500 mg total) by mouth 2 (two) times daily with a meal. 180 tablet 1  . nystatin (NYSTATIN) powder Apply daily after a shower as needed 60 g 2  . RESTASIS 0.05 % ophthalmic emulsion Place 1 drop into both eyes daily.    . rosuvastatin (CRESTOR) 40 MG tablet Take 1 tablet Daily for Cholesterol 90 tablet 0  . senna (SENOKOT) 8.6 MG tablet Take 1 tablet by mouth daily.     No current facility-administered medications for this visit.    Allergies  Allergen Reactions  . Fentanyl Other (See Comments)    Intolerlance    Social History   Socioeconomic History  . Marital status: Married    Spouse name: Not on file  . Number of children: 1  . Years of education: Not on file  . Highest education level: Not on file  Occupational History  . Occupation: Retired Teacher, early years/pre  Tobacco Use  . Smoking status: Former Smoker    Packs/day: 1.00    Years: 30.00    Pack years: 30.00    Types: Cigarettes    Quit date: 08/28/2004    Years since quitting: 15.2  . Smokeless tobacco: Never Used  . Tobacco comment: QUIT SIX YEARS AGO  Substance and Sexual  Activity  . Alcohol use: Yes    Alcohol/week: 14.0 standard drinks    Types: 14 Standard drinks or equivalent per week    Comment: Drinks 2-3 glasses wine nightly x 20 years  . Drug use: No  . Sexual activity: Not Currently  Other Topics Concern  . Not on file  Social History Narrative   Lives with husband in a two story home.  Has 1 child and 4 stepchildren. Retired Teacher, early years/pre.  Education: Masters.     Social Determinants of Health   Financial Resource Strain:   . Difficulty of Paying Living Expenses: Not on file  Food Insecurity:   . Worried About Programme researcher, broadcasting/film/video in the Last Year: Not on file  . Ran Out of Food in the Last Year: Not on file  Transportation Needs:   . Lack of Transportation (Medical): Not on file  . Lack of Transportation (Non-Medical): Not  on file  Physical Activity:   . Days of Exercise per Week: Not on file  . Minutes of Exercise per Session: Not on file  Stress:   . Feeling of Stress : Not on file  Social Connections:   . Frequency of Communication with Friends and Family: Not on file  . Frequency of Social Gatherings with Friends and Family: Not on file  . Attends Religious Services: Not on file  . Active Member of Clubs or Organizations: Not on file  . Attends Banker Meetings: Not on file  . Marital Status: Not on file  Intimate Partner Violence:   . Fear of Current or Ex-Partner: Not on file  . Emotionally Abused: Not on file  . Physically Abused: Not on file  . Sexually Abused: Not on file    Family History  Problem Relation Age of Onset  . Hypertension Mother   . Coronary artery disease Brother     Review of Systems:  As stated in the HPI and otherwise negative.   BP 140/66   Pulse 99   Ht 5' 5.75" (1.67 m)   Wt 216 lb (98 kg)   SpO2 97%   BMI 35.13 kg/m   Physical Examination:  General: Well developed, well nourished, NAD  HEENT: OP clear, mucus membranes moist  SKIN: warm, dry. No rashes. Neuro: No focal deficits  Musculoskeletal: Muscle strength 5/5 all ext  Psychiatric: Mood and affect normal  Neck: No JVD, no carotid bruits, no thyromegaly, no lymphadenopathy.  Lungs:Clear bilaterally, no wheezes, rhonci, crackles Cardiovascular: Regular rate and rhythm. No murmurs, gallops or rubs. Abdomen:Soft. Bowel sounds present. Non-tender.  Extremities: No lower extremity edema. Pulses are 2 + in the bilateral DP/PT.  EKG:  EKG is not ordered today. The ekg ordered today demonstrates    Echo March 2019: Left ventricle: The cavity size was normal. There was severe  septal hypertrophy with otherwise moderate concentric  hypertrophy. Systolic function was normal. The estimated ejection  fraction was in the range of 60% to 65%. Wall motion was normal;  there were no  regional wall motion abnormalities. Features are  consistent with a pseudonormal left ventricular filling pattern,  with concomitant abnormal relaxation and increased filling  pressure (grade 2 diastolic dysfunction). Doppler parameters are  consistent with high ventricular filling pressure.  - Aortic valve: Transvalvular velocity was within the normal range.  There was no stenosis. There was mild to moderate regurgitation.  Regurgitation pressure half-time: 472 ms.  - Aorta: Ascending aortic diameter: 40 mm (S).  - Ascending  aorta: The ascending aorta was mildly dilated.  - Mitral valve: Transvalvular velocity was within the normal range.  There was no evidence for stenosis. There was trivial  regurgitation.  - Left atrium: The atrium was moderately dilated.  - Right ventricle: The cavity size was normal. Wall thickness was  normal. Systolic function was normal.  - Tricuspid valve: There was trivial regurgitation.  - Pulmonary arteries: Systolic pressure was mildly increased. PA  peak pressure: 44 mm Hg (S).  - Pericardium, extracardiac: A trivial pericardial effusion was  identified.   Recent Labs: 10/23/2019: ALT 19; BUN 18; Creat 0.90; Hemoglobin 13.6; Magnesium 1.9; Platelets 273; Potassium 4.4; Sodium 133; TSH 3.24   Lipid Panel    Component Value Date/Time   CHOL 217 (H) 10/23/2019 1124   TRIG 103 10/23/2019 1124   HDL 105 10/23/2019 1124   CHOLHDL 2.1 10/23/2019 1124   VLDL 23 07/06/2016 1200   LDLCALC 92 10/23/2019 1124     Wt Readings from Last 3 Encounters:  11/14/19 216 lb (98 kg)  10/23/19 218 lb 9.6 oz (99.2 kg)  07/17/19 218 lb (98.9 kg)      Assessment and Plan:   1. HTN: BP is well controlled. Continue current therapy  2. Hyperlipidemia: Lipids well controlled June 2021. Continue statin and Zetia  3. Aortic insufficiency: Mild to moderate by echo in 2019. Repeat echo now.   Current medicines are reviewed at length with the  patient today.  The patient does not have concerns regarding medicines.  The following changes have been made:  no change  Labs/ tests ordered today include:   Orders Placed This Encounter  Procedures  . ECHOCARDIOGRAM COMPLETE     Disposition:   FU with me in one year.    Signed, Verne Carrow, MD 11/14/2019 11:54 AM    [facility] [address] [place], [place], [place]  [address] Phone: [phone]; Fax: [phone]

## 2019-11-14 ENCOUNTER — Other Ambulatory Visit: Payer: Self-pay

## 2019-11-14 ENCOUNTER — Encounter: Payer: Self-pay | Admitting: Cardiovascular Disease

## 2019-11-14 ENCOUNTER — Ambulatory Visit: Payer: Medicare PPO | Admitting: Cardiovascular Disease

## 2019-11-14 VITALS — BP 140/66 | HR 99 | Ht 65.75 in | Wt 216.0 lb

## 2019-11-14 DIAGNOSIS — I351 Nonrheumatic aortic (valve) insufficiency: Secondary | ICD-10-CM

## 2019-11-14 DIAGNOSIS — I10 Essential (primary) hypertension: Secondary | ICD-10-CM | POA: Diagnosis not present

## 2019-11-14 NOTE — Patient Instructions (Addendum)
Medication Instructions:  Your provider recommends that you continue on your current medications as directed. Please refer to the Current Medication list given to you today.   *If you need a refill on your cardiac medications before your next appointment, please call your pharmacy*  Testing/Procedures: Your provider has requested that you have an echocardiogram. Echocardiography is a painless test that uses sound waves to create images of your heart. It provides your doctor with information about the size and shape of your heart and how well your heart's chambers and valves are working. This procedure takes approximately one hour. There are no restrictions for this procedure.     Follow-Up: At [facility], you and your health needs are our priority.  As part of our continuing mission to provide you with exceptional heart care, we have created designated Provider Care Teams.  These Care Teams include your primary Cardiologist (physician) and Advanced Practice Providers (APPs -  Physician Assistants and Nurse Practitioners) who all work together to provide you with the care you need, when you need it. Your next appointment:   12 month(s) The format for your next appointment:   In Person Provider:   You may see Christopher McAlhany, MD or one of the following Advanced Practice Providers on your designated Care Team:    Dayna Dunn, PA-C  Michele Lenze, PA-C   

## 2019-11-28 ENCOUNTER — Ambulatory Visit (HOSPITAL_COMMUNITY): Payer: Medicare PPO | Attending: Cardiology

## 2019-11-28 ENCOUNTER — Other Ambulatory Visit: Payer: Self-pay

## 2019-11-28 DIAGNOSIS — I351 Nonrheumatic aortic (valve) insufficiency: Secondary | ICD-10-CM | POA: Diagnosis not present

## 2019-11-28 LAB — ECHOCARDIOGRAM COMPLETE
Area-P 1/2: 3.5 cm2
P 1/2 time: 323 msec
S' Lateral: 3 cm

## 2019-12-02 ENCOUNTER — Other Ambulatory Visit: Payer: Self-pay | Admitting: Physician Assistant

## 2019-12-02 ENCOUNTER — Other Ambulatory Visit: Payer: Self-pay | Admitting: Internal Medicine

## 2019-12-02 DIAGNOSIS — M546 Pain in thoracic spine: Secondary | ICD-10-CM | POA: Diagnosis not present

## 2019-12-02 DIAGNOSIS — M545 Low back pain: Secondary | ICD-10-CM | POA: Diagnosis not present

## 2019-12-02 DIAGNOSIS — G4700 Insomnia, unspecified: Secondary | ICD-10-CM

## 2019-12-02 MED ORDER — ALPRAZOLAM 0.5 MG PO TABS: ORAL_TABLET | ORAL | 0 refills | Status: DC

## 2019-12-03 ENCOUNTER — Telehealth: Payer: Self-pay

## 2019-12-03 NOTE — Telephone Encounter (Signed)
The patient has been notified of the result and verbalized understanding.  All questions (if any) were answered. Leanord Hawking, RN 12/03/2019 11:43 AM

## 2019-12-03 NOTE — Telephone Encounter (Signed)
-----   Message from [name], MD sent at 12/01/2019  9:42 AM EDT ----- Her heart is strong. Her aortic valve is known to have mild leakiness and this is still mild. Good news. Deanna Gomez

## 2019-12-04 ENCOUNTER — Ambulatory Visit (INDEPENDENT_AMBULATORY_CARE_PROVIDER_SITE_OTHER): Payer: Medicare PPO

## 2019-12-04 ENCOUNTER — Other Ambulatory Visit: Payer: Self-pay

## 2019-12-04 VITALS — Temp 97.3°F

## 2019-12-04 DIAGNOSIS — Z23 Encounter for immunization: Secondary | ICD-10-CM

## 2019-12-08 DIAGNOSIS — Z1231 Encounter for screening mammogram for malignant neoplasm of breast: Secondary | ICD-10-CM | POA: Diagnosis not present

## 2019-12-08 DIAGNOSIS — M85851 Other specified disorders of bone density and structure, right thigh: Secondary | ICD-10-CM | POA: Diagnosis not present

## 2019-12-08 DIAGNOSIS — M85852 Other specified disorders of bone density and structure, left thigh: Secondary | ICD-10-CM | POA: Diagnosis not present

## 2019-12-08 LAB — HM DEXA SCAN

## 2019-12-08 LAB — HM MAMMOGRAPHY

## 2019-12-09 ENCOUNTER — Encounter: Payer: Self-pay | Admitting: Internal Medicine

## 2019-12-16 DIAGNOSIS — M545 Low back pain, unspecified: Secondary | ICD-10-CM | POA: Diagnosis not present

## 2019-12-17 ENCOUNTER — Other Ambulatory Visit: Payer: Self-pay

## 2019-12-17 ENCOUNTER — Other Ambulatory Visit: Payer: Medicare PPO | Admitting: *Deleted

## 2019-12-17 DIAGNOSIS — R300 Dysuria: Secondary | ICD-10-CM | POA: Diagnosis not present

## 2019-12-18 LAB — URINALYSIS, ROUTINE W REFLEX MICROSCOPIC
Bacteria, UA: NONE SEEN /HPF
Bilirubin Urine: NEGATIVE
Glucose, UA: NEGATIVE
Hgb urine dipstick: NEGATIVE
Hyaline Cast: NONE SEEN /LPF
Ketones, ur: NEGATIVE
Leukocytes,Ua: NEGATIVE
Nitrite: NEGATIVE
RBC / HPF: NONE SEEN /HPF (ref 0–2)
Specific Gravity, Urine: 1.009 (ref 1.001–1.03)
Squamous Epithelial / HPF: NONE SEEN /HPF (ref <=5)
WBC, UA: NONE SEEN /HPF (ref 0–5)
pH: 7 (ref 5.0–8.0)

## 2019-12-18 LAB — URINE CULTURE
MICRO NUMBER:: 11068392
Result:: NO GROWTH
SPECIMEN QUALITY:: ADEQUATE

## 2019-12-19 NOTE — Progress Notes (Signed)
========================================================== -   Test results slightly outside the reference range are not unusual. If there is anything important, I will review this with you,  otherwise it is considered normal test values.  If you have further questions,  please do not hesitate to contact me at the office or via My Chart.  ==========================================================  -  Urine Culture finalized showing No Infection  - So . . . . . . . . Marland Kitchen No Antibiotics needed ==========================================================

## 2019-12-30 DIAGNOSIS — M545 Low back pain, unspecified: Secondary | ICD-10-CM | POA: Diagnosis not present

## 2020-01-09 ENCOUNTER — Other Ambulatory Visit: Payer: Self-pay | Admitting: Internal Medicine

## 2020-01-09 DIAGNOSIS — G4700 Insomnia, unspecified: Secondary | ICD-10-CM

## 2020-01-12 ENCOUNTER — Encounter: Payer: Self-pay | Admitting: Adult Health Nurse Practitioner

## 2020-01-12 NOTE — Progress Notes (Unsigned)
     Elder Negus, Edrick Oh, DNP [facility] 01/12/2020  1:27 PM

## 2020-01-13 ENCOUNTER — Telehealth: Payer: Self-pay

## 2020-01-13 NOTE — Telephone Encounter (Signed)
Patient has been made aware.

## 2020-01-13 NOTE — Telephone Encounter (Signed)
-----   Message from Elder Negus, NP sent at 01/12/2020  1:27 PM EST ----- Regarding: Bone Density results  [facility] screening Osteoporosis 12/08/19.  T-Score -1.5, Low Bone Mass - Osteopenia.  Next screening 2 years.    Sincerely,           Elder Negus, NP

## 2020-01-15 DIAGNOSIS — M545 Low back pain, unspecified: Secondary | ICD-10-CM | POA: Diagnosis not present

## 2020-01-21 ENCOUNTER — Encounter: Payer: Self-pay | Admitting: Adult Health Nurse Practitioner

## 2020-01-21 ENCOUNTER — Other Ambulatory Visit: Payer: Self-pay

## 2020-01-21 ENCOUNTER — Ambulatory Visit (INDEPENDENT_AMBULATORY_CARE_PROVIDER_SITE_OTHER): Payer: Medicare PPO | Admitting: Adult Health Nurse Practitioner

## 2020-01-21 VITALS — BP 136/70 | HR 100 | Temp 97.9°F | Ht 65.0 in | Wt 218.0 lb

## 2020-01-21 DIAGNOSIS — I700 Atherosclerosis of aorta: Secondary | ICD-10-CM

## 2020-01-21 DIAGNOSIS — K529 Noninfective gastroenteritis and colitis, unspecified: Secondary | ICD-10-CM

## 2020-01-21 DIAGNOSIS — R7309 Other abnormal glucose: Secondary | ICD-10-CM | POA: Diagnosis not present

## 2020-01-21 DIAGNOSIS — I5032 Chronic diastolic (congestive) heart failure: Secondary | ICD-10-CM | POA: Diagnosis not present

## 2020-01-21 DIAGNOSIS — E782 Mixed hyperlipidemia: Secondary | ICD-10-CM

## 2020-01-21 DIAGNOSIS — Z79899 Other long term (current) drug therapy: Secondary | ICD-10-CM

## 2020-01-21 DIAGNOSIS — E669 Obesity, unspecified: Secondary | ICD-10-CM | POA: Diagnosis not present

## 2020-01-21 DIAGNOSIS — I10 Essential (primary) hypertension: Secondary | ICD-10-CM | POA: Diagnosis not present

## 2020-01-21 DIAGNOSIS — G4700 Insomnia, unspecified: Secondary | ICD-10-CM

## 2020-01-21 DIAGNOSIS — E559 Vitamin D deficiency, unspecified: Secondary | ICD-10-CM | POA: Diagnosis not present

## 2020-01-21 DIAGNOSIS — Z8619 Personal history of other infectious and parasitic diseases: Secondary | ICD-10-CM

## 2020-01-21 DIAGNOSIS — E66811 Obesity, class 1: Secondary | ICD-10-CM

## 2020-01-21 NOTE — Progress Notes (Signed)
Patient ID: Deanna Gomez, female   DOB: Oct 05, 1945, 74 y.o.   MRN: [id number]   FOLLOW UP 3 MONTH  Assessment:   Deanna Gomez was seen today for follow-up.  Diagnoses and all orders for this visit:  Essential hypertension Continue current medications: losartan 100mg  Monitor blood pressure at home; call if consistently over 130/80 Continue DASH diet.   Reminder to go to the ER if any CP, SOB, nausea, dizziness, severe HA, changes vision/speech, left arm numbness and tingling and jaw pain. -     CBC with Differential/Platelet -     COMPLETE METABOLIC PANEL WITH GFR  Hyperlipidemia, mixed Continue medications: Rousuvastatin 40mg  daily Discussed dietary and exercise modifications Low fat diet -     Lipid panel  Abdominal aortic atherosclerosis (HCC) Per CT 2018 Control blood pressure, cholesterol, glucose, increase exercise.   History of hepatitis C S/p treatment, in remission -     CMP/GFR at routine OVs  Chronic diastolic congestive heart failure (HCC) Disease process and medications discussed. Questions answered fully. Emphasized salt restriction, less than 2000mg  a day. Encouraged daily monitoring of the patient's weight, call office if 5 lb weight loss or gain in a day.  Encouraged regular exercise. If any increasing shortness of breath, swelling, or chest pressure go to ER immediately.  decrease your fluid intake to less than 2 L daily please remember to always increase your potassium intake with any increase of your fluid pill.    Abnormal glucose Discussed dietary and exercise modifications  Vitamin D deficiency Continue supplementation to maintain goal of 70-100 Taking Vitamin D 5,000 IU daily Defer vitamin D level   Obesity (BMI 30.0-34.9) Discussed dietary and exercise modifications  Insomnia, unspecified type Discussed good sleep hygiene, decrease stimulation prior to sleep Increase day time activity Avoid caffeine in evenings Try OTC Melatonin 10mg   or benedryl We can consider a sleep aid if these do not work  Medication management  Continued  Frequent stools Discussed stress as contributor to this or possible diet change with eating out more. Discussed increasing fiber via diet or metamucil   2019, DNP [facility] 01/21/2020  4:51 PM     Future Appointments  Date Time Provider Department Center  05/03/2020  2:00 PM Loni Dolly, NP [facility] None  07/22/2020 11:15 AM 01/23/2020, NP [facility] None     Subjective:   Deanna Gomez is a 74 y.o. female who presents for three month follow up for HTN, HLD, CHF, abnormal glucose, weight and vitamin D defciency.  Patient is tearful with conversation, her husband passed away two weeks ago.  She reports that she has had so much to do since then and reports being overwhelmed with paperwork.  She reports she has a good support system and friends in the area.  She reports she has been getting out and walking.  She walks [place] the street to shopping [place].  She reports being very social.   She has had an increase in BM.  She is going 4-5 times a day, every two hours.  She reports she is type4 but at times it feels like it is more clay like, harder to get out.  Other problems include Chronic LBP from severe thoracolumbar scoliosis s/p DDD fusion surgery x 3. Referred to local provider [facility], has stopped since covid but continues home exercises and doing well. She is now going  She  has xanax 0.25-0.5 mg which she takes only if unable to fall asleep after waking up in the middle of the night or if very anxious due to storm. Typically takes 0.25 mg, 3-4 days per week.   She also was treated for Hepatitis C (1987).   BMI is Body mass index is 36.28 kg/m., she was going to PT  Every other week, doing PT and stretching/strenght training at home daily, is working  on diet/portions, Weight goal <200 lb.    Wt Readings from Last 3 Encounters:  01/21/20 218 lb (98.9 kg)  11/14/19 216 lb (98 kg)  10/23/19 218 lb 9.6 oz (99.2 kg)   She has had elevated blood pressure since 1996. Her blood pressure has been controlled at home, reports somewhat labile & today their BP is BP: 136/70 . She denies dizziness, CP, dyspnea, edema.  She does not workout. She denies chest pain, shortness of breath, dizziness.   She has aortic atherosclerosis per CT 2018. She is following with cardiology after ECHO in 2019 ordered due to exertional dyspnea demonstrated grade 2 diastolic dysfunction; BP and symptoms improved recently.   She denies dyspnea on exertion, orthopnea, paroxysmal nocturnal dyspnea and edema. Positive for none.  She is on cholesterol medication (rosuvastatin 40 mg daily) and denies myalgias. Her cholesterol is at goal. The cholesterol last visit was:  Lab Results  Component Value Date   CHOL 217 (H) 10/23/2019   HDL 105 10/23/2019   LDLCALC 92 10/23/2019   TRIG 103 10/23/2019   CHOLHDL 2.1 10/23/2019   She has had prediabetes since 2012. She has been working on diet and exercise for glucose management, and denies foot ulcerations, hyperglycemia, hypoglycemia , increased appetite, nausea, paresthesia of the feet, polydipsia, polyuria, visual disturbances, vomiting and weight loss. Last A1C in the office was:  Lab Results  Component Value Date   HGBA1C 5.5 10/23/2019    Last GFR:  Lab Results  Component Value Date   GFRNONAA 62 10/23/2019   Patient is on Vitamin D supplement.   Lab Results  Component Value Date   VD25OH 50 10/23/2019        Medication Review: Current Outpatient Medications on File Prior to Visit  Medication Sig   ALPRAZolam (XANAX) 0.5 MG tablet TAKE 1/2 TO 1 TABLET BY MOUTH TWO TIMES A DAY ONLY IF NEEDED FOR ANXIETY ATTACK OR SLEEP. LIMIT TO 5 DAYS A WEEK TO AVOID ADDICTION AND DEMENTIA.   aspirin EC 81 MG tablet Take 81  mg by mouth daily.   Cholecalciferol (VITAMIN D3 PO) Take 5,000 Units by mouth daily.    losartan (COZAAR) 100 MG tablet TAKE ONE TABLET BY MOUTH DAILY   Magnesium 500 MG TABS Take 1 tablet (500 mg total) by mouth 2 (two) times daily with a meal.   nystatin (NYSTATIN) powder Apply daily after a shower as needed   RESTASIS 0.05 % ophthalmic emulsion Place 1 drop into both eyes daily.   rosuvastatin (CRESTOR) 40 MG tablet Take 1 tablet Daily for Cholesterol   senna (SENOKOT) 8.6 MG tablet Take 1 tablet by mouth daily.   No current facility-administered medications on file prior to visit.    Current Problems (verified) Patient Active Problem List   Diagnosis Date Noted   History of colon polyps 07/17/2019   Osteopenia 07/17/2019   Constipation 01/06/2019   Insomnia 07/04/2018   Diastolic CHF (HCC) 12/11/2017   Hepatic steatosis 12/26/2016   Abdominal aortic atherosclerosis (HCC) 12/26/2016   Obesity (BMI 30.0-34.9)  10/15/2014   Essential hypertension 03/09/2013   Hyperlipidemia, mixed 03/09/2013   Abnormal glucose 03/09/2013   Vitamin D deficiency 03/09/2013   History of hepatitis C 03/09/2013    Screening Tests Immunization History  Administered Date(s) Administered   DTaP 03/06/2006   Influenza Split 11/20/2013, 12/24/2014   Influenza Whole 12/19/2012   Influenza, High Dose Seasonal PF 01/04/2017, 12/10/2017, 11/08/2018, 12/04/2019   PFIZER SARS-COV-2 Vaccination 03/27/2019, 04/17/2019   Pneumococcal Conjugate-13 05/13/2014   Pneumococcal Polysaccharide-23 03/06/2008, 10/25/2016   Typhoid Live 03/06/2004   Zoster 06/11/2007   Preventative care: Last colonoscopy: 10/2014, due 10/2019, requesting referral to Dr. Ewing Schlein  CXR 06/2016 [facility] 12/2019 at [facility]  Echo 2019 - grade 2 diastolic Stress test 2013 PAP 96/7591 DONE DEXA:  10/21 T-1.5 Osteopenia  2018 - T -1.7 -  Tdap: 2008, declines Influenza: 2019 Pneumonia: 2018 Prevnar:  2016 Shingles: 2009, not interested in shingrix Covid 19: 2/2, 2021, pfizer  Names of Other Physician/Practitioners you currently use: 1. [facility] here for primary care 2. Dr. Jimmey Ralph, eye doctor, last visit 02/2019, Due for 2021 3. Dr. Ardelle Anton, Dentist, last visit 2020, goes Q36m  Patient Care Team: Lucky Cowboy, MD as PCP - General (Internal Medicine) [name], MD as PCP - Cardiology (Cardiology) Sharrell Ku, MD as Consulting Physician (Gastroenterology) Chevis Pretty, Dimas Aguas, MD as Consulting Physician (Gynecology) Elmon Else, MD as Consulting Physician (Dermatology) [name], DO (Ophthalmology) Rehabilitation, Julien Girt as Physical Therapist (Physical Therapy)   History reviewed: allergies, current medications, past family history, past medical history, past social history, past surgical history and problem list  Allergies Allergies  Allergen Reactions   Fentanyl Other (See Comments)    Intolerlance    SURGICAL HISTORY She  has a past surgical history that includes Back surgery (1986); Breast reconstruction; and Cataract extraction. FAMILY HISTORY Her family history includes Coronary artery disease in her brother; Hypertension in her mother. SOCIAL HISTORY She  reports that she quit smoking about 15 years ago. Her smoking use included cigarettes. She has a 30.00 pack-year smoking history. She has never used smokeless tobacco. She reports current alcohol use of about 14.0 standard drinks of alcohol per week. She reports that she does not use drugs.    Objective:     BP 136/70    Pulse 100    Temp 97.9 F (36.6 C)    Ht 5\' 5"  (1.651 m)    Wt 218 lb (98.9 kg)    SpO2 98%    BMI 36.28 kg/m   General Appearance: Well nourished, in no apparent distress. Eyes: PERRLA, EOMs, conjunctiva no swelling or erythema Sinuses: No Frontal/maxillary tenderness ENT/Mouth: Ext aud canals clear, TMs without  erythema, bulging. No erythema, swelling, or exudate on post pharynx.  Tonsils not swollen or erythematous. Hearing normal.  Neck: Supple, thyroid normal.  Respiratory: Respiratory effort normal, BS equal bilaterally without rales, rhonchi, wheezing or stridor.  Cardio: RRR with no MRGs. Brisk peripheral pulses without edema.  Abdomen: Soft, + BS.  Non tender, no guarding, rebound, hernias, masses. Lymphatics: Non tender without lymphadenopathy.  Musculoskeletal: Full ROM, 5/5 strength, Normal gait Skin: Warm, dry without rashes, lesions, ecchymosis.  Neuro: Cranial nerves intact. No cerebellar symptoms.  Psych: Awake and oriented X 3, normal affect, Insight and Judgment appropriate.      , Elder Negus, DNP [facility] 01/21/2020  4:34 PM

## 2020-01-22 LAB — CBC WITH DIFFERENTIAL/PLATELET
Absolute Monocytes: 649 cells/uL (ref 200–950)
Basophils Absolute: 35 cells/uL (ref 0–200)
Basophils Relative: 0.3 %
Eosinophils Absolute: 12 cells/uL — ABNORMAL LOW (ref 15–500)
Eosinophils Relative: 0.1 %
HCT: 41.3 % (ref 35.0–45.0)
Hemoglobin: 14.5 g/dL (ref 11.7–15.5)
Lymphs Abs: 1451 cells/uL (ref 850–3900)
MCH: 34 pg — ABNORMAL HIGH (ref 27.0–33.0)
MCHC: 35.1 g/dL (ref 32.0–36.0)
MCV: 96.7 fL (ref 80.0–100.0)
MPV: 9.6 fL (ref 7.5–12.5)
Monocytes Relative: 5.5 %
Neutro Abs: 9652 cells/uL — ABNORMAL HIGH (ref 1500–7800)
Neutrophils Relative %: 81.8 %
Platelets: 277 10*3/uL (ref 140–400)
RBC: 4.27 10*6/uL (ref 3.80–5.10)
RDW: 13.4 % (ref 11.0–15.0)
Total Lymphocyte: 12.3 %
WBC: 11.8 10*3/uL — ABNORMAL HIGH (ref 3.8–10.8)

## 2020-01-22 LAB — COMPLETE METABOLIC PANEL WITH GFR
AG Ratio: 1.7 (calc) (ref 1.0–2.5)
ALT: 23 U/L (ref 6–29)
AST: 34 U/L (ref 10–35)
Albumin: 4.7 g/dL (ref 3.6–5.1)
Alkaline phosphatase (APISO): 66 U/L (ref 37–153)
BUN/Creatinine Ratio: 16 (calc) (ref 6–22)
BUN: 18 mg/dL (ref 7–25)
CO2: 28 mmol/L (ref 20–32)
Calcium: 10.5 mg/dL — ABNORMAL HIGH (ref 8.6–10.4)
Chloride: 95 mmol/L — ABNORMAL LOW (ref 98–110)
Creat: 1.12 mg/dL — ABNORMAL HIGH (ref 0.60–0.93)
GFR, Est African American: 55 mL/min/{1.73_m2} — ABNORMAL LOW (ref >=60)
GFR, Est Non African American: 48 mL/min/{1.73_m2} — ABNORMAL LOW (ref >=60)
Globulin: 2.8 g/dL (calc) (ref 1.9–3.7)
Glucose, Bld: 101 mg/dL — ABNORMAL HIGH (ref 65–99)
Potassium: 5 mmol/L (ref 3.5–5.3)
Sodium: 136 mmol/L (ref 135–146)
Total Bilirubin: 0.6 mg/dL (ref 0.2–1.2)
Total Protein: 7.5 g/dL (ref 6.1–8.1)

## 2020-01-22 LAB — LIPID PANEL
Cholesterol: 270 mg/dL — ABNORMAL HIGH (ref <200)
HDL: 125 mg/dL (ref >=50)
LDL Cholesterol (Calc): 122 mg/dL (calc) — ABNORMAL HIGH
Non-HDL Cholesterol (Calc): 145 mg/dL (calc) — ABNORMAL HIGH (ref <130)
Total CHOL/HDL Ratio: 2.2 (calc) (ref <5.0)
Triglycerides: 122 mg/dL (ref <150)

## 2020-01-28 ENCOUNTER — Ambulatory Visit: Payer: Medicare PPO | Admitting: Adult Health Nurse Practitioner

## 2020-02-09 DIAGNOSIS — M545 Low back pain, unspecified: Secondary | ICD-10-CM | POA: Diagnosis not present

## 2020-02-18 ENCOUNTER — Other Ambulatory Visit: Payer: Self-pay | Admitting: Adult Health

## 2020-02-18 ENCOUNTER — Telehealth: Payer: Self-pay

## 2020-02-18 DIAGNOSIS — G4700 Insomnia, unspecified: Secondary | ICD-10-CM

## 2020-02-18 MED ORDER — ALPRAZOLAM 0.5 MG PO TABS: ORAL_TABLET | ORAL | 0 refills | Status: DC

## 2020-02-18 NOTE — Progress Notes (Signed)
Future Appointments  Date Time Provider Department Center  05/03/2020  2:00 PM [name], NP [facility] None  07/22/2020 11:15 AM [name], NP [facility] None   PDMP reviewed for xanax refill request.

## 2020-02-18 NOTE — Telephone Encounter (Signed)
Refill request for Alprazolam 

## 2020-03-20 ENCOUNTER — Other Ambulatory Visit: Payer: Self-pay | Admitting: Adult Health

## 2020-03-20 DIAGNOSIS — G4700 Insomnia, unspecified: Secondary | ICD-10-CM

## 2020-04-06 ENCOUNTER — Telehealth: Payer: Self-pay | Admitting: *Deleted

## 2020-04-06 ENCOUNTER — Other Ambulatory Visit: Payer: Self-pay | Admitting: Internal Medicine

## 2020-04-06 DIAGNOSIS — D3131 Benign neoplasm of right choroid: Secondary | ICD-10-CM | POA: Diagnosis not present

## 2020-04-06 DIAGNOSIS — H43813 Vitreous degeneration, bilateral: Secondary | ICD-10-CM | POA: Diagnosis not present

## 2020-04-06 DIAGNOSIS — H02831 Dermatochalasis of right upper eyelid: Secondary | ICD-10-CM | POA: Diagnosis not present

## 2020-04-06 DIAGNOSIS — H02834 Dermatochalasis of left upper eyelid: Secondary | ICD-10-CM | POA: Diagnosis not present

## 2020-04-06 MED ORDER — BUSPIRONE HCL 10 MG PO TABS: ORAL_TABLET | ORAL | 0 refills | Status: DC

## 2020-04-06 NOTE — Telephone Encounter (Signed)
Left a message to inform patient that Dr Oneta Rack did not send in a refill of Xanax to replace her lost medication. Per Dr Oneta Rack, the patient was advised to try the Buspar he sent to replace the Xanax.

## 2020-04-21 ENCOUNTER — Encounter: Payer: Medicare PPO | Admitting: Adult Health

## 2020-05-01 NOTE — Progress Notes (Signed)
Patient ID: Deanna CowmanSuzanne B Balthaser, female   DOB: [birth year], 75 y.o.   MRN: [id number]   CPE  Assessment:   CPE  1 year  Atherosclerosis of aorta Per CT 2018 Control blood pressure, cholesterol, glucose, increase exercise.   Diastolic CHF Grade 2 dysfunction, Weight stable/down, appears euvolemic Followed by cardiology  Essential hypertension - continue medications, DASH diet, exercise and monitor at home. Call if greater than 130/80.   Hepatitis C virus infection without hepatic coma, unspecified chronicity S/p treatment, in remission; monitor LFTs at routine OVs  Hepatic steatosis Weight loss advised, low processed carb diet, avoid alcohol/tylenol, will monitor LFTs  Hyperlipidemia -continue medications, check lipids, decrease fatty foods, increase activity.   Other abnormal glucose (hx of prediabetes) Discussed general issues about diabetes pathophysiology and management., Educational material distributed., Suggested low cholesterol diet., Encouraged aerobic exercise., Discussed foot care., Reminded to get yearly retinal exam.  Morbid obesity -  Long discussion about weight loss, diet, and exercise Recommended diet heavy in fruits and veggies and low in animal meats, cheeses, and dairy products, appropriate calorie intake Patient will work on getting back on metabolism regulation diet and start working Discussed appropriate weight for height and initial goal (200lb) Follow up at next visit  Vitamin D deficiency At goal at recent check; continue to recommend supplementation for goal of 60-100 Defer vitamin D level  Chronic midline back pain Hx of severe scoliosis with fusion 10+ years ago, [place].  Insomnia Uses xanax PRN good sleep hygiene discussed, increase day time activity, try melatonin or benadryl   History of colon polyps Follow up was due August 2021, placed referral to Dr. Ewing SchleinMagod per patient preference  Osteopenia Last improved,  get next DEXA 12/2021, continue Vit D and Ca, weight bearing exercises  R ear cerumen impaction - stop using Qtips, irrigation used in the office without complications, use OTC drops/oil at home to prevent reoccurence  Left breast intertrigo Previously well controlled by nystatin but ran out; nystatin refilled Reviewed hygiene; dry under breast well after showering, cotton undergarments Follow up if not improving   Orders Placed This Encounter  Procedures  . CBC with Differential/Platelet  . COMPLETE METABOLIC PANEL WITH GFR  . Magnesium  . Lipid panel  . TSH  . Hemoglobin A1c  . VITAMIN D 25 Hydroxy (Vit-D Deficiency, Fractures)  . Microalbumin / creatinine urine ratio  . Urinalysis, Routine w reflex microscopic  . Vitamin B12  . Ambulatory referral to Gastroenterology  . EKG 12-Lead     Future Appointments  Date Time Provider Department Center  07/22/2020 11:15 AM Judd Gaudierorbett, Dotty Gonzalo, NP [facility] None  11/03/2020 11:30 AM Lucky CowboyMcKeown, William, MD [facility] None  05/03/2021  2:00 PM Judd Gaudierorbett, Nimah Uphoff, NP [facility] None     Plan:   During the course of the visit the patient was educated and counseled about appropriate screening and preventive services including:    Pneumococcal vaccine   Influenza vaccine  Td vaccine  Screening electrocardiogram  Bone densitometry screening  Colorectal cancer screening  Diabetes screening  Glaucoma screening  Nutrition counseling   Advanced directives: requested   Subjective:   Deanna Gomez is a 75 y.o. female who presents for CPE. She has Essential hypertension; Hyperlipidemia, mixed; Abnormal glucose; Vitamin D deficiency; History of hepatitis C; Morbid obesity (HCC); Hepatic steatosis; Abdominal aortic atherosclerosis (HCC); Diastolic CHF (HCC); Insomnia; Constipation; History of colon polyps; and Osteopenia on their problem list.  She is widowed. She has 1 biological child,  4 step children, 8 grandchildren and 1  great grandchild. She is a retired Hospital doctor.   Her husband of 36 years passed 01/04/2020 due to advanced cardiac disease. Now living by herself, feels she is doing well. Did get established with grief counselor, will continue to follow. Doesn't want daily med. She has xanax 0.25-0.5 mg which she takes only if unable to fall asleep after waking up in the middle of the night or if very anxious due to storm. Typically takes 0.25 mg, 3-4 days per week.   Significant history include Chronic LBP from severe thoracolumbar scoliosis s/p DDD fusion surgery x 3.Established with local provider and benefits from PT intermittently. She has continued with home exercises since covid 19.  She also underwent curative treatment for Hepatitis C (1987).   BMI is Body mass index is 35.19 kg/m., she was going to PT but stopped with husband illness, plans to restart, stretching/strenght training at home daily, is working on diet/portions, Weight goal <200 lb.  Wt Readings from Last 3 Encounters:  05/03/20 216 lb 6.4 oz (98.2 kg)  01/21/20 218 lb (98.9 kg)  11/14/19 216 lb (98 kg)   She has had elevated blood pressure since 1996. Her blood pressure has been controlled at home, reports somewhat labile & today their BP is BP: 138/68 . She denies dizziness, CP, dyspnea, edema.   She does workout. She denies chest pain, shortness of breath, dizziness.  She has aortic atherosclerosis per CT 2018.  She is following with cardiology after ECHO in 2019 ordered due to exertional dyspnea demonstrated grade 2 diastolic dysfunction; BP and symptoms improved recently.   She denies dyspnea on exertion, orthopnea, paroxysmal nocturnal dyspnea and edema. Positive for none.  She is on cholesterol medication (rosuvastatin 40 mg daily, admits doesn't take regularly, perhaps 2-3 days per week) and denies myalgias. Her cholesterol is not at goal. The cholesterol last visit was:  Lab Results  Component Value Date    CHOL 270 (H) 01/21/2020   HDL 125 01/21/2020   LDLCALC 122 (H) 01/21/2020   TRIG 122 01/21/2020   CHOLHDL 2.2 01/21/2020   She had prediabetes in 2012. She has been working on diet and exercise for glucose management, and denies foot ulcerations, hyperglycemia, hypoglycemia , increased appetite, nausea, paresthesia of the feet, polydipsia, polyuria, visual disturbances, vomiting and weight loss. Last A1C in the office was:  Lab Results  Component Value Date   HGBA1C 5.5 10/23/2019   basecline CKD II, denies NSAID use, fluid intake varies, typically about 3 bottles/week.  Last GFR:  Lab Results  Component Value Date   GFRNONAA 48 (L) 01/21/2020   GFRNONAA 62 10/23/2019   GFRNONAA 81 07/17/2019   Patient is on Vitamin D supplement  Lab Results  Component Value Date   VD25OH 50 10/23/2019        Medication Review: Current Outpatient Medications on File Prior to Visit  Medication Sig  . ALPRAZolam (XANAX) 0.5 MG tablet Take    1/2 - 1 tablet    1 to 2  x /day    ONLY    if needed for Anxiety Attack or sleep  &  limit to 5 days /week to avoid Addiction & Dementia  . aspirin EC 81 MG tablet Take 81 mg by mouth daily.  . Cholecalciferol (VITAMIN D3 PO) Take 5,000 Units by mouth daily.   Marland Kitchen losartan (COZAAR) 100 MG tablet TAKE ONE TABLET BY MOUTH DAILY  . Magnesium 500 MG TABS  Take 1 tablet (500 mg total) by mouth 2 (two) times daily with a meal.  . RESTASIS 0.05 % ophthalmic emulsion Place 1 drop into both eyes daily.  . rosuvastatin (CRESTOR) 40 MG tablet Take 1 tablet Daily for Cholesterol  . senna (SENOKOT) 8.6 MG tablet Take 1 tablet by mouth daily.  . busPIRone (BUSPAR) 10 MG tablet Take  1 tablet  3 x /day  for Chronic Anxiety (Patient not taking: Reported on 05/03/2020)   No current facility-administered medications on file prior to visit.    Current Problems (verified) Patient Active Problem List   Diagnosis Date Noted  . History of colon polyps 07/17/2019  . Osteopenia  07/17/2019  . Constipation 01/06/2019  . Insomnia 07/04/2018  . Diastolic CHF (HCC) 12/11/2017  . Hepatic steatosis 12/26/2016  . Abdominal aortic atherosclerosis (HCC) 12/26/2016  . Morbid obesity (HCC) 10/15/2014  . Essential hypertension 03/09/2013  . Hyperlipidemia, mixed 03/09/2013  . Abnormal glucose 03/09/2013  . Vitamin D deficiency 03/09/2013  . History of hepatitis C 03/09/2013    Screening Tests Immunization History  Administered Date(s) Administered  . DTaP 03/06/2006  . Influenza Split 11/20/2013, 12/24/2014  . Influenza Whole 12/19/2012  . Influenza, High Dose Seasonal PF 01/04/2017, 12/10/2017, 11/08/2018, 12/04/2019  . PFIZER(Purple Top)SARS-COV-2 Vaccination 03/27/2019, 04/17/2019, 12/15/2019  . Pneumococcal Conjugate-13 05/13/2014  . Pneumococcal Polysaccharide-23 03/06/2008, 10/25/2016  . Typhoid Live 03/06/2004  . Zoster 06/11/2007    Preventative care: Last colonoscopy: 10/2014, due 10/2019, referred to Dr. Ewing Schlein but had to cancel due to husband MGM 12/08/2019 [facility] DEXA: 2010, 2018, 12/2019 - T -1.5 - improved from previous PAP 11/2014 DONE  Echo 2019 - grade 2 diastolic Stress test 2013  Tdap: 2008, declines Influenza: 11/2019 Pneumonia: 2018 Prevnar: 2016 Shingles: 2009, not interested in shingrix Covid 19: 3/3, 2021, pfizer - has had booster  Names of Other Physician/Practitioners you currently use: 1. [facility] here for primary care 2. Dr. Jimmey Ralph, eye doctor, last visit 2021 3. Dr. Ardelle Anton, Dentist, last visit 2021, goes q55m 4. Dr. Emily Filbert, derm, last 2021, had to reschedule - will see this year  Patient Care Team: Lucky Cowboy, MD as PCP - General (Internal Medicine) [name], MD as PCP - Cardiology (Cardiology) Sharrell Ku, MD as Consulting Physician (Gastroenterology) Teodora Medici, MD as Consulting Physician (Gynecology) Elmon Else, MD as Consulting Physician  (Dermatology) [name], DO (Ophthalmology) Rehabilitation, Julien Girt as Physical Therapist (Physical Therapy)   Allergies Allergies  Allergen Reactions  . Fentanyl Other (See Comments)    Intolerlance    SURGICAL HISTORY She  has a past surgical history that includes Back surgery (1986); Breast reconstruction; and Cataract extraction. FAMILY HISTORY Her family history includes Coronary artery disease in her brother; Hyperlipidemia in her father; Obesity in her brother. SOCIAL HISTORY She  reports that she quit smoking about 15 years ago. Her smoking use included cigarettes. She has a 30.00 pack-year smoking history. She has never used smokeless tobacco. She reports current alcohol use of about 14.0 standard drinks of alcohol per week. She reports that she does not use drugs.  Review of Systems  Constitutional: Negative for malaise/fatigue and weight loss.  HENT: Negative for hearing loss and tinnitus.   Eyes: Negative for blurred vision and double vision.  Respiratory: Negative for cough, sputum production, shortness of breath and wheezing.   Cardiovascular: Negative for chest pain, palpitations, orthopnea, claudication, leg swelling and PND.  Gastrointestinal: Negative for abdominal pain, blood in stool, constipation,  diarrhea, heartburn, melena, nausea and vomiting.  Genitourinary: Negative.   Musculoskeletal: Negative for falls, joint pain and myalgias.  Skin: Negative for rash (under left breast).  Neurological: Negative for dizziness, tingling, sensory change, weakness and headaches.  Endo/Heme/Allergies: Negative for polydipsia.  Psychiatric/Behavioral: Negative.  Negative for depression, memory loss, substance abuse and suicidal ideas. The patient is not nervous/anxious and does not have insomnia.   All other systems reviewed and are negative.    Objective:     BP 138/68   Pulse 66   Temp (!) 97.3 F (36.3 C)   Ht 5' 5.75" (1.67 m)   Wt 216 lb 6.4 oz  (98.2 kg)   SpO2 98%   BMI 35.19 kg/m   General Appearance: Well nourished, in no apparent distress. Eyes: PERRLA, EOMs, conjunctiva no swelling or erythema Sinuses: No Frontal/maxillary tenderness ENT/Mouth: L Ext aud canals clear, TM without erythema, bulging. R canal obstructed by hard wax. No erythema, swelling, or exudate on post pharynx.  Tonsils not swollen or erythematous. Hearing normal.  Neck: Supple, thyroid normal.  Respiratory: Respiratory effort normal, BS equal bilaterally without rales, rhonchi, wheezing or stridor.  Cardio: RRR with no MRGs. Brisk peripheral pulses without edema.  Abdomen: Soft, + BS.  Non tender, no guarding, rebound, masses. She has non-tender easily reduced ventral hernia. Lymphatics: Non tender without lymphadenopathy.  Musculoskeletal: Full ROM, 5/5 strength, Normal gait Skin: Warm, dry without rashes, lesions, ecchymosis.  Neuro: Cranial nerves intact. No cerebellar symptoms.  Psych: Awake and oriented X 3, normal affect, Insight and Judgment appropriate.  Breasts:  breasts appear normal, no suspicious masses, no skin or nipple changes or axillary nodes, excepting symmetrical erythematous rash under left breast fold GU: defer   EKG: NSR, PAC, IRBBB  Dan Maker, NP   05/03/2020

## 2020-05-03 ENCOUNTER — Other Ambulatory Visit: Payer: Self-pay

## 2020-05-03 ENCOUNTER — Encounter: Payer: Self-pay | Admitting: Adult Health

## 2020-05-03 ENCOUNTER — Ambulatory Visit (INDEPENDENT_AMBULATORY_CARE_PROVIDER_SITE_OTHER): Payer: Medicare PPO | Admitting: Adult Health

## 2020-05-03 VITALS — BP 138/68 | HR 66 | Temp 97.3°F | Ht 65.75 in | Wt 216.4 lb

## 2020-05-03 DIAGNOSIS — D649 Anemia, unspecified: Secondary | ICD-10-CM | POA: Diagnosis not present

## 2020-05-03 DIAGNOSIS — Z86010 Personal history of colonic polyps: Secondary | ICD-10-CM

## 2020-05-03 DIAGNOSIS — H6121 Impacted cerumen, right ear: Secondary | ICD-10-CM

## 2020-05-03 DIAGNOSIS — N182 Chronic kidney disease, stage 2 (mild): Secondary | ICD-10-CM

## 2020-05-03 DIAGNOSIS — I700 Atherosclerosis of aorta: Secondary | ICD-10-CM

## 2020-05-03 DIAGNOSIS — Z1389 Encounter for screening for other disorder: Secondary | ICD-10-CM

## 2020-05-03 DIAGNOSIS — K5904 Chronic idiopathic constipation: Secondary | ICD-10-CM

## 2020-05-03 DIAGNOSIS — E538 Deficiency of other specified B group vitamins: Secondary | ICD-10-CM | POA: Diagnosis not present

## 2020-05-03 DIAGNOSIS — M8580 Other specified disorders of bone density and structure, unspecified site: Secondary | ICD-10-CM

## 2020-05-03 DIAGNOSIS — I10 Essential (primary) hypertension: Secondary | ICD-10-CM

## 2020-05-03 DIAGNOSIS — Z0000 Encounter for general adult medical examination without abnormal findings: Secondary | ICD-10-CM | POA: Diagnosis not present

## 2020-05-03 DIAGNOSIS — G4700 Insomnia, unspecified: Secondary | ICD-10-CM

## 2020-05-03 DIAGNOSIS — E559 Vitamin D deficiency, unspecified: Secondary | ICD-10-CM | POA: Diagnosis not present

## 2020-05-03 DIAGNOSIS — K760 Fatty (change of) liver, not elsewhere classified: Secondary | ICD-10-CM | POA: Diagnosis not present

## 2020-05-03 DIAGNOSIS — Z1329 Encounter for screening for other suspected endocrine disorder: Secondary | ICD-10-CM

## 2020-05-03 DIAGNOSIS — Z131 Encounter for screening for diabetes mellitus: Secondary | ICD-10-CM

## 2020-05-03 DIAGNOSIS — Z8619 Personal history of other infectious and parasitic diseases: Secondary | ICD-10-CM

## 2020-05-03 DIAGNOSIS — Z136 Encounter for screening for cardiovascular disorders: Secondary | ICD-10-CM | POA: Diagnosis not present

## 2020-05-03 DIAGNOSIS — R7309 Other abnormal glucose: Secondary | ICD-10-CM | POA: Diagnosis not present

## 2020-05-03 DIAGNOSIS — E669 Obesity, unspecified: Secondary | ICD-10-CM

## 2020-05-03 DIAGNOSIS — E782 Mixed hyperlipidemia: Secondary | ICD-10-CM

## 2020-05-03 DIAGNOSIS — Z1211 Encounter for screening for malignant neoplasm of colon: Secondary | ICD-10-CM

## 2020-05-03 DIAGNOSIS — I5032 Chronic diastolic (congestive) heart failure: Secondary | ICD-10-CM

## 2020-05-03 DIAGNOSIS — B372 Candidiasis of skin and nail: Secondary | ICD-10-CM

## 2020-05-03 MED ORDER — NYSTATIN 100000 UNIT/GM EX POWD: CUTANEOUS | 2 refills | Status: DC

## 2020-05-03 NOTE — Patient Instructions (Signed)
   Consider vitamin D with K2 supplement - helpful for bones

## 2020-05-04 ENCOUNTER — Encounter: Payer: Self-pay | Admitting: Adult Health

## 2020-05-04 DIAGNOSIS — E538 Deficiency of other specified B group vitamins: Secondary | ICD-10-CM | POA: Insufficient documentation

## 2020-05-04 LAB — COMPLETE METABOLIC PANEL WITH GFR
AG Ratio: 1.5 (calc) (ref 1.0–2.5)
ALT: 16 U/L (ref 6–29)
AST: 22 U/L (ref 10–35)
Albumin: 4.3 g/dL (ref 3.6–5.1)
Alkaline phosphatase (APISO): 65 U/L (ref 37–153)
BUN: 12 mg/dL (ref 7–25)
CO2: 31 mmol/L (ref 20–32)
Calcium: 10 mg/dL (ref 8.6–10.4)
Chloride: 92 mmol/L — ABNORMAL LOW (ref 98–110)
Creat: 0.8 mg/dL (ref 0.60–0.93)
GFR, Est African American: 83 mL/min/{1.73_m2} (ref >=60)
GFR, Est Non African American: 72 mL/min/{1.73_m2} (ref >=60)
Globulin: 2.8 g/dL (calc) (ref 1.9–3.7)
Glucose, Bld: 97 mg/dL (ref 65–99)
Potassium: 4.3 mmol/L (ref 3.5–5.3)
Sodium: 133 mmol/L — ABNORMAL LOW (ref 135–146)
Total Bilirubin: 0.9 mg/dL (ref 0.2–1.2)
Total Protein: 7.1 g/dL (ref 6.1–8.1)

## 2020-05-04 LAB — LIPID PANEL
Cholesterol: 260 mg/dL — ABNORMAL HIGH (ref <200)
HDL: 106 mg/dL (ref >=50)
LDL Cholesterol (Calc): 121 mg/dL (calc) — ABNORMAL HIGH
Non-HDL Cholesterol (Calc): 154 mg/dL (calc) — ABNORMAL HIGH (ref <130)
Total CHOL/HDL Ratio: 2.5 (calc) (ref <5.0)
Triglycerides: 214 mg/dL — ABNORMAL HIGH (ref <150)

## 2020-05-04 LAB — HEMOGLOBIN A1C
Hgb A1c MFr Bld: 5.5 % of total Hgb (ref <5.7)
Mean Plasma Glucose: 111 mg/dL
eAG (mmol/L): 6.2 mmol/L

## 2020-05-04 LAB — CBC WITH DIFFERENTIAL/PLATELET
Absolute Monocytes: 488 cells/uL (ref 200–950)
Basophils Absolute: 28 cells/uL (ref 0–200)
Basophils Relative: 0.3 %
Eosinophils Absolute: 18 cells/uL (ref 15–500)
Eosinophils Relative: 0.2 %
HCT: 42 % (ref 35.0–45.0)
Hemoglobin: 14.8 g/dL (ref 11.7–15.5)
Lymphs Abs: 1408 cells/uL (ref 850–3900)
MCH: 34.5 pg — ABNORMAL HIGH (ref 27.0–33.0)
MCHC: 35.2 g/dL (ref 32.0–36.0)
MCV: 97.9 fL (ref 80.0–100.0)
MPV: 9.8 fL (ref 7.5–12.5)
Monocytes Relative: 5.3 %
Neutro Abs: 7259 cells/uL (ref 1500–7800)
Neutrophils Relative %: 78.9 %
Platelets: 265 10*3/uL (ref 140–400)
RBC: 4.29 10*6/uL (ref 3.80–5.10)
RDW: 12.5 % (ref 11.0–15.0)
Total Lymphocyte: 15.3 %
WBC: 9.2 10*3/uL (ref 3.8–10.8)

## 2020-05-04 LAB — TSH: TSH: 2.67 mIU/L (ref 0.40–4.50)

## 2020-05-04 LAB — VITAMIN D 25 HYDROXY (VIT D DEFICIENCY, FRACTURES): Vit D, 25-Hydroxy: 42 ng/mL (ref 30–100)

## 2020-05-04 LAB — MAGNESIUM: Magnesium: 1.5 mg/dL (ref 1.5–2.5)

## 2020-05-04 LAB — VITAMIN B12: Vitamin B-12: 254 pg/mL (ref 200–1100)

## 2020-05-06 DIAGNOSIS — E538 Deficiency of other specified B group vitamins: Secondary | ICD-10-CM | POA: Diagnosis not present

## 2020-05-06 DIAGNOSIS — Z0000 Encounter for general adult medical examination without abnormal findings: Secondary | ICD-10-CM | POA: Diagnosis not present

## 2020-05-06 DIAGNOSIS — R7309 Other abnormal glucose: Secondary | ICD-10-CM | POA: Diagnosis not present

## 2020-05-06 DIAGNOSIS — D649 Anemia, unspecified: Secondary | ICD-10-CM | POA: Diagnosis not present

## 2020-05-06 DIAGNOSIS — K760 Fatty (change of) liver, not elsewhere classified: Secondary | ICD-10-CM | POA: Diagnosis not present

## 2020-05-06 DIAGNOSIS — I700 Atherosclerosis of aorta: Secondary | ICD-10-CM | POA: Diagnosis not present

## 2020-05-06 DIAGNOSIS — I10 Essential (primary) hypertension: Secondary | ICD-10-CM | POA: Diagnosis not present

## 2020-05-06 DIAGNOSIS — E559 Vitamin D deficiency, unspecified: Secondary | ICD-10-CM | POA: Diagnosis not present

## 2020-05-06 DIAGNOSIS — E782 Mixed hyperlipidemia: Secondary | ICD-10-CM | POA: Diagnosis not present

## 2020-05-07 ENCOUNTER — Other Ambulatory Visit: Payer: Self-pay | Admitting: Adult Health

## 2020-05-07 DIAGNOSIS — R801 Persistent proteinuria, unspecified: Secondary | ICD-10-CM

## 2020-05-07 LAB — URINALYSIS, ROUTINE W REFLEX MICROSCOPIC
Bacteria, UA: NONE SEEN /HPF
Bilirubin Urine: NEGATIVE
Glucose, UA: NEGATIVE
Hgb urine dipstick: NEGATIVE
Hyaline Cast: NONE SEEN /LPF
Ketones, ur: NEGATIVE
Leukocytes,Ua: NEGATIVE
Nitrite: NEGATIVE
RBC / HPF: NONE SEEN /HPF (ref 0–2)
Specific Gravity, Urine: 1.006 (ref 1.001–1.03)
Squamous Epithelial / HPF: NONE SEEN /HPF (ref <=5)
WBC, UA: NONE SEEN /HPF (ref 0–5)
pH: 5.5 (ref 5.0–8.0)

## 2020-05-07 LAB — MICROALBUMIN / CREATININE URINE RATIO
Creatinine, Urine: 35 mg/dL (ref 20–275)
Microalb Creat Ratio: 831 mcg/mg creat — ABNORMAL HIGH (ref <30)
Microalb, Ur: 29.1 mg/dL

## 2020-05-11 ENCOUNTER — Other Ambulatory Visit: Payer: Self-pay

## 2020-05-11 ENCOUNTER — Ambulatory Visit (INDEPENDENT_AMBULATORY_CARE_PROVIDER_SITE_OTHER): Payer: Medicare PPO

## 2020-05-11 DIAGNOSIS — R801 Persistent proteinuria, unspecified: Secondary | ICD-10-CM | POA: Diagnosis not present

## 2020-05-11 NOTE — Progress Notes (Signed)
Patient presents to the office for a nurse visit to have a urine check. Instructions for a 24 hour urine were given by the lab. Vitals taken and recorded.

## 2020-05-13 LAB — PROTEIN,TOTAL AND PROTEIN ELECTROPHORESIS, RANDOM URINE(REFL)
Albumin: 85 %
Alpha-1-Globulin, U: 5 %
Alpha-2-Globulin, U: 4 %
Beta Globulin, U: 6 %
Creatinine, Urine: 71 mg/dL (ref 20–275)
Gamma Globulin, U: 1 %
Protein/Creat Ratio: 1070 mg/g creat — ABNORMAL HIGH (ref 21–161)
Protein/Creatinine Ratio: 1.07 mg/mg creat — ABNORMAL HIGH (ref 0.021–0.16)
Total Protein, Urine: 76 mg/dL — ABNORMAL HIGH (ref 5–24)

## 2020-05-14 ENCOUNTER — Other Ambulatory Visit: Payer: Self-pay | Admitting: Internal Medicine

## 2020-05-14 ENCOUNTER — Other Ambulatory Visit: Payer: Self-pay | Admitting: Adult Health Nurse Practitioner

## 2020-05-14 DIAGNOSIS — I10 Essential (primary) hypertension: Secondary | ICD-10-CM

## 2020-05-14 MED ORDER — HYDROCHLOROTHIAZIDE 25 MG PO TABS: ORAL_TABLET | ORAL | 0 refills | Status: DC

## 2020-05-17 DIAGNOSIS — R801 Persistent proteinuria, unspecified: Secondary | ICD-10-CM | POA: Diagnosis not present

## 2020-05-18 ENCOUNTER — Other Ambulatory Visit: Payer: Self-pay | Admitting: Adult Health

## 2020-05-18 DIAGNOSIS — R801 Persistent proteinuria, unspecified: Secondary | ICD-10-CM

## 2020-05-18 LAB — PROTEIN, URINE, 24 HOUR: Protein, 24H Urine: 806 mg/24 h — ABNORMAL HIGH (ref 0–149)

## 2020-05-18 LAB — CREATININE, URINE, 24 HOUR: Creatinine, 24H Ur: 0.65 g/(24.h) (ref 0.50–2.15)

## 2020-06-03 DIAGNOSIS — L814 Other melanin hyperpigmentation: Secondary | ICD-10-CM | POA: Diagnosis not present

## 2020-06-03 DIAGNOSIS — D2230 Melanocytic nevi of unspecified part of face: Secondary | ICD-10-CM | POA: Diagnosis not present

## 2020-06-03 DIAGNOSIS — D485 Neoplasm of uncertain behavior of skin: Secondary | ICD-10-CM | POA: Diagnosis not present

## 2020-06-03 DIAGNOSIS — Z85828 Personal history of other malignant neoplasm of skin: Secondary | ICD-10-CM | POA: Diagnosis not present

## 2020-06-03 DIAGNOSIS — D225 Melanocytic nevi of trunk: Secondary | ICD-10-CM | POA: Diagnosis not present

## 2020-06-03 DIAGNOSIS — L820 Inflamed seborrheic keratosis: Secondary | ICD-10-CM | POA: Diagnosis not present

## 2020-06-03 DIAGNOSIS — L578 Other skin changes due to chronic exposure to nonionizing radiation: Secondary | ICD-10-CM | POA: Diagnosis not present

## 2020-06-03 DIAGNOSIS — E785 Hyperlipidemia, unspecified: Secondary | ICD-10-CM | POA: Diagnosis not present

## 2020-06-03 DIAGNOSIS — Z86018 Personal history of other benign neoplasm: Secondary | ICD-10-CM | POA: Diagnosis not present

## 2020-06-03 DIAGNOSIS — I129 Hypertensive chronic kidney disease with stage 1 through stage 4 chronic kidney disease, or unspecified chronic kidney disease: Secondary | ICD-10-CM | POA: Diagnosis not present

## 2020-06-03 DIAGNOSIS — N182 Chronic kidney disease, stage 2 (mild): Secondary | ICD-10-CM | POA: Diagnosis not present

## 2020-06-03 DIAGNOSIS — L821 Other seborrheic keratosis: Secondary | ICD-10-CM | POA: Diagnosis not present

## 2020-06-03 DIAGNOSIS — D2272 Melanocytic nevi of left lower limb, including hip: Secondary | ICD-10-CM | POA: Diagnosis not present

## 2020-06-03 DIAGNOSIS — R801 Persistent proteinuria, unspecified: Secondary | ICD-10-CM | POA: Diagnosis not present

## 2020-06-03 DIAGNOSIS — B1920 Unspecified viral hepatitis C without hepatic coma: Secondary | ICD-10-CM | POA: Diagnosis not present

## 2020-06-09 DIAGNOSIS — H168 Other keratitis: Secondary | ICD-10-CM | POA: Diagnosis not present

## 2020-06-09 DIAGNOSIS — H5712 Ocular pain, left eye: Secondary | ICD-10-CM | POA: Diagnosis not present

## 2020-06-09 DIAGNOSIS — H04123 Dry eye syndrome of bilateral lacrimal glands: Secondary | ICD-10-CM | POA: Diagnosis not present

## 2020-06-28 DIAGNOSIS — E785 Hyperlipidemia, unspecified: Secondary | ICD-10-CM | POA: Diagnosis not present

## 2020-06-28 DIAGNOSIS — R801 Persistent proteinuria, unspecified: Secondary | ICD-10-CM | POA: Diagnosis not present

## 2020-06-28 DIAGNOSIS — E871 Hypo-osmolality and hyponatremia: Secondary | ICD-10-CM | POA: Diagnosis not present

## 2020-06-28 DIAGNOSIS — N182 Chronic kidney disease, stage 2 (mild): Secondary | ICD-10-CM | POA: Diagnosis not present

## 2020-06-28 DIAGNOSIS — I129 Hypertensive chronic kidney disease with stage 1 through stage 4 chronic kidney disease, or unspecified chronic kidney disease: Secondary | ICD-10-CM | POA: Diagnosis not present

## 2020-06-29 DIAGNOSIS — N182 Chronic kidney disease, stage 2 (mild): Secondary | ICD-10-CM | POA: Diagnosis not present

## 2020-07-01 ENCOUNTER — Other Ambulatory Visit: Payer: Self-pay | Admitting: Internal Medicine

## 2020-07-02 ENCOUNTER — Other Ambulatory Visit (HOSPITAL_COMMUNITY): Payer: Self-pay | Admitting: Nephrology

## 2020-07-02 DIAGNOSIS — R801 Persistent proteinuria, unspecified: Secondary | ICD-10-CM

## 2020-07-14 ENCOUNTER — Other Ambulatory Visit: Payer: Self-pay | Admitting: Radiology

## 2020-07-14 ENCOUNTER — Other Ambulatory Visit: Payer: Self-pay | Admitting: Student

## 2020-07-15 ENCOUNTER — Encounter (HOSPITAL_COMMUNITY): Payer: Self-pay

## 2020-07-15 ENCOUNTER — Ambulatory Visit (HOSPITAL_COMMUNITY)
Admission: RE | Admit: 2020-07-15 | Discharge: 2020-07-15 | Disposition: A | Discharge status: Home / Self Care | Payer: Medicare PPO | Source: Ambulatory Visit | Attending: Nephrology | Admitting: Nephrology

## 2020-07-15 ENCOUNTER — Other Ambulatory Visit: Payer: Self-pay

## 2020-07-15 DIAGNOSIS — R801 Persistent proteinuria, unspecified: Secondary | ICD-10-CM | POA: Diagnosis not present

## 2020-07-15 DIAGNOSIS — I700 Atherosclerosis of aorta: Secondary | ICD-10-CM | POA: Insufficient documentation

## 2020-07-15 DIAGNOSIS — N269 Renal sclerosis, unspecified: Secondary | ICD-10-CM | POA: Diagnosis not present

## 2020-07-15 DIAGNOSIS — Z8249 Family history of ischemic heart disease and other diseases of the circulatory system: Secondary | ICD-10-CM | POA: Diagnosis not present

## 2020-07-15 DIAGNOSIS — Z79899 Other long term (current) drug therapy: Secondary | ICD-10-CM | POA: Diagnosis not present

## 2020-07-15 DIAGNOSIS — E785 Hyperlipidemia, unspecified: Secondary | ICD-10-CM | POA: Diagnosis not present

## 2020-07-15 DIAGNOSIS — Z7982 Long term (current) use of aspirin: Secondary | ICD-10-CM | POA: Insufficient documentation

## 2020-07-15 DIAGNOSIS — I10 Essential (primary) hypertension: Secondary | ICD-10-CM | POA: Diagnosis not present

## 2020-07-15 DIAGNOSIS — Z8711 Personal history of peptic ulcer disease: Secondary | ICD-10-CM | POA: Insufficient documentation

## 2020-07-15 DIAGNOSIS — R809 Proteinuria, unspecified: Secondary | ICD-10-CM | POA: Diagnosis not present

## 2020-07-15 DIAGNOSIS — Z8619 Personal history of other infectious and parasitic diseases: Secondary | ICD-10-CM | POA: Diagnosis not present

## 2020-07-15 LAB — CBC
HCT: 42.6 % (ref 36.0–46.0)
Hemoglobin: 14.5 g/dL (ref 12.0–15.0)
MCH: 33.7 pg (ref 26.0–34.0)
MCHC: 34 g/dL (ref 30.0–36.0)
MCV: 99.1 fL (ref 80.0–100.0)
Platelets: 248 10*3/uL (ref 150–400)
RBC: 4.3 MIL/uL (ref 3.87–5.11)
RDW: 13.2 % (ref 11.5–15.5)
WBC: 8.4 10*3/uL (ref 4.0–10.5)
nRBC: 0 % (ref 0.0–0.2)

## 2020-07-15 LAB — PROTIME-INR
INR: 0.9 (ref 0.8–1.2)
Prothrombin Time: 12.2 seconds (ref 11.4–15.2)

## 2020-07-15 MED ORDER — MIDAZOLAM HCL 2 MG/2ML IJ SOLN
INTRAMUSCULAR | Status: AC | PRN
  Administered 2020-07-15 (×2): 1 mg via INTRAVENOUS

## 2020-07-15 MED ORDER — HYDROMORPHONE HCL 1 MG/ML IJ SOLN
INTRAMUSCULAR | Status: AC | PRN
  Administered 2020-07-15 (×2): 0.5 mg via INTRAVENOUS

## 2020-07-15 MED ORDER — SODIUM CHLORIDE 0.9 % IV SOLN: INTRAVENOUS | Status: DC

## 2020-07-15 MED ORDER — ONDANSETRON HCL 4 MG/2ML IJ SOLN
4.0000 mg | Freq: Once | INTRAMUSCULAR | Status: AC
  Administered 2020-07-15: 4 mg via INTRAVENOUS
  Filled 2020-07-15: qty 2

## 2020-07-15 MED ORDER — HYDROMORPHONE HCL 1 MG/ML IJ SOLN
INTRAMUSCULAR | Status: AC
  Filled 2020-07-15: qty 1

## 2020-07-15 MED ORDER — MIDAZOLAM HCL 2 MG/2ML IJ SOLN
INTRAMUSCULAR | Status: AC
  Filled 2020-07-15: qty 2

## 2020-07-15 MED ORDER — SODIUM CHLORIDE 0.9 % IV SOLN: 8.0000 mg | Freq: Once | INTRAVENOUS | Status: DC

## 2020-07-15 MED ORDER — LIDOCAINE HCL (PF) 1 % IJ SOLN
INTRAMUSCULAR | Status: AC
  Filled 2020-07-15: qty 30

## 2020-07-15 MED ORDER — GELATIN ABSORBABLE 12-7 MM EX MISC
CUTANEOUS | Status: AC
  Filled 2020-07-15: qty 1

## 2020-07-15 MED ORDER — ONDANSETRON HCL 4 MG/2ML IJ SOLN
INTRAMUSCULAR | Status: AC
  Filled 2020-07-15: qty 2

## 2020-07-15 MED ORDER — HYDRALAZINE HCL 20 MG/ML IJ SOLN
INTRAMUSCULAR | Status: AC | PRN
  Administered 2020-07-15: 10 mg via INTRAVENOUS

## 2020-07-15 NOTE — Progress Notes (Signed)
Pt c/o nausea, fluid emesis post sips of water. spoke with Carollee Herter PA rad, zofran was ordered

## 2020-07-15 NOTE — Procedures (Signed)
Interventional Radiology Procedure Note  Procedure: US guided biopsy of left kidney, medical renal Complications: None EBL: None Recommendations: - Bedrest 2 hours.   - Routine wound care - Follow up pathology - Advance diet   Signed,  Arijana Narayan, DO   

## 2020-07-15 NOTE — Discharge Instructions (Addendum)
Percutaneous Kidney Biopsy, Care After This sheet gives you information about how to care for yourself after your procedure. Your health care provider may also give you more specific instructions. If you have problems or questions, contact your health care provider. What can I expect after the procedure? After the procedure, it is common to have:  Pain or soreness near the biopsy site.  Pink or cloudy urine for 24 hours after the procedure. This is normal. Follow these instructions at home: Activity  Return to your normal activities as told by your health care provider. Ask your health care provider what activities are safe for you.  If you were given a sedative during the procedure, it can affect you for several hours. Do not drive or operate machinery until your health care provider says that it is safe.  Do not lift anything that is heavier than 10 lb (4.5 kg), or the limit that you are told, until your health care provider says that it is safe.  Avoid activities that take a lot of effort until your health care provider approves. Most people will have to wait 2 weeks before returning to activities such as exercise or sex. General instructions  Take over-the-counter and prescription medicines only as told by your health care provider.  Follow instructions from your health care provider about eating or drinking restrictions.  Check your biopsy site every day for signs of infection. Check for: ? More redness, swelling, or pain. ? Fluid or blood. ? Warmth. ? Pus or a bad smell.  Keep all follow-up visits as told by your health care provider. This is important.   Contact a health care provider if:  You have more redness, swelling, or pain around your biopsy site.  You have fluid or blood coming from your biopsy site.  Your biopsy site feels warm to the touch.  You have pus or a bad smell coming from your biopsy site.  You have blood in your urine more than 24 hours after your  procedure. Get help right away if:  Your urine is dark red or brown.  You have a fever.  You are not able to urinate.  You feel burning when you urinate.  You feel dizzy or light-headed.  You have severe pain in your abdomen or side. Summary  After the procedure, it is common to have pain or soreness at the biopsy site and pink or cloudy urine for the first 24 hours.  Check your biopsy site each day for signs of infection, such as more redness, swelling, or pain; fluid, blood, pus or a bad smell coming from the biopsy site; or the biopsy site feeling warm to the touch.  Return to your normal activities as told by your health care provider. This information is not intended to replace advice given to you by your health care provider. Make sure you discuss any questions you have with your health care provider. Document Revised: 05/16/2019 Document Reviewed: 05/16/2019 Elsevier Patient Education  2021 Elsevier Inc. Moderate Conscious Sedation, Adult Sedation is the use of medicines to promote relaxation and to relieve discomfort and anxiety. Moderate conscious sedation is a type of sedation. Under moderate conscious sedation, you are less alert than normal, but you are still able to respond to instructions, touch, or both. Moderate conscious sedation is used during short medical and dental procedures. It is milder than deep sedation, which is a type of sedation under which you cannot be easily woken up. It is also milder than   general anesthesia, which is the use of medicines to make you unconscious. Moderate conscious sedation allows you to return to your regular activities sooner. Tell a health care provider about:  Any allergies you have.  All medicines you are taking, including vitamins, herbs, eye drops, creams, and over-the-counter medicines.  Any use of steroids. This includes steroids taken by mouth or as a cream.  Any problems you or family members have had with sedatives and  anesthetic medicines.  Any blood disorders you have.  Any surgeries you have had.  Any medical conditions you have, such as sleep apnea.  Whether you are pregnant or may be pregnant.  Any use of cigarettes, alcohol, marijuana, or drugs. What are the risks? Generally, this is a safe procedure. However, problems may occur, including:  Getting too much medicine (oversedation).  Nausea.  Allergic reaction to medicines.  Trouble breathing. If this happens, a breathing tube may be used. It will be removed when you are awake and breathing on your own.  Heart trouble.  Lung trouble.  Confusion that gets better with time (emergence delirium). What happens before the procedure? Staying hydrated Follow instructions from your health care provider about hydration, which may include:  Up to 2 hours before the procedure - you may continue to drink clear liquids, such as water, clear fruit juice, black coffee, and plain tea. Eating and drinking restrictions Follow instructions from your health care provider about eating and drinking, which may include:  8 hours before the procedure - stop eating heavy meals or foods, such as meat, fried foods, or fatty foods.  6 hours before the procedure - stop eating light meals or foods, such as toast or cereal.  6 hours before the procedure - stop drinking milk or drinks that contain milk.  2 hours before the procedure - stop drinking clear liquids. Medicines Ask your health care provider about:  Changing or stopping your regular medicines. This is especially important if you are taking diabetes medicines or blood thinners.  Taking medicines such as aspirin and ibuprofen. These medicines can thin your blood. Do not take these medicines unless your health care provider tells you to take them.  Taking over-the-counter medicines, vitamins, herbs, and supplements. Tests and exams  You will have a physical exam.  You may have blood tests done to  show how well: ? Your kidneys and liver work. ? Your blood clots. General instructions  Plan to have a responsible adult take you home from the hospital or clinic.  If you will be going home right after the procedure, plan to have a responsible adult care for you for the time you are told. This is important. What happens during the procedure?  You will be given the sedative. The sedative may be given: ? As a pill that you will swallow. It can also be inserted into the rectum. ? As a spray through the nose. ? As an injection into the muscle. ? As an injection into the vein through an IV.  You may be given oxygen as needed.  Your breathing, heart rate, and blood pressure will be monitored during the procedure.  The medical or dental procedure will be done. The procedure may vary among health care providers and hospitals.   What happens after the procedure?  Your blood pressure, heart rate, breathing rate, and blood oxygen level will be monitored until you leave the hospital or clinic.  You will get fluids through your IV if needed.  Do not drive   or operate machinery until your health care provider says that it is safe. Summary  Sedation is the use of medicines to promote relaxation and to relieve discomfort and anxiety. Moderate conscious sedation is a type of sedation that is used during short medical and dental procedures.  Tell the health care provider about any medical conditions that you have and about all the medicines that you are taking.  You will be given the sedative as a pill, a spray through the nose, an injection into the muscle, or an injection into the vein through an IV. Vital signs are monitored during the sedation.  Moderate conscious sedation allows you to return to your regular activities sooner. This information is not intended to replace advice given to you by your health care provider. Make sure you discuss any questions you have with your health care  provider. Document Revised: 06/20/2019 Document Reviewed: 01/16/2019 Elsevier Patient Education  2021 Elsevier Inc.  

## 2020-07-15 NOTE — H&P (Signed)
Chief Complaint: Patient was seen in consultation today for image guided random kidney biopsy at the request of Singh,Vikas  Referring Physician(s): Singh,Vikas  Supervising Physician: Gilmer Mor  Patient Status: [facility] - Out-pt  History of Present Illness: Deanna Gomez is a 75 y.o. female with PMHs of HLD, HTN, gastric ulcer, abdominal aortic atherosclerosis, hepatitis C, who was evaluated by Dr. Thedore Mins for persistent proteinuria.  Per nephrology note, patient's secondary/serological work up was unrevealing, therefore a random renal biopsy was recommend to the patient for further evaluation.   IR was requested for random renal biopsy.   Patient laying in bed, not in acute distress.  Denise headache, fever, chills, shortness of breath, cough, chest pain, abdominal pain, nausea ,vomiting, and bleeding.   Past Medical History:  Diagnosis Date  . Abdominal aortic atherosclerosis (HCC) 12/26/2016   Noted incidentally on 12/24/2016 CT  . Gastric ulcer 11/19/2014  . Hepatic steatosis 12/26/2016   Noted on CT 12/24/2016 - moderate degree without distinct lesions  . Hepatitis C   . HTN (hypertension)   . Hyperlipidemia   . Obesity   . Pre-diabetes   . Scoliosis   . Tobacco use    stopped 6 years ago.  . Vitamin D deficiency     Past Surgical History:  Procedure Laterality Date  . BACK SURGERY  1986   x 3  . BREAST RECONSTRUCTION     x 2  . CATARACT EXTRACTION     Bilateral    Allergies: Fentanyl  Medications: Prior to Admission medications   Medication Sig Start Date End Date Taking? Authorizing Provider  aspirin EC 81 MG tablet Take 81 mg by mouth daily.   Yes [provider]  ALPRAZolam Prudy Feeler) 0.5 MG tablet Take    1/2 - 1 tablet    1 to 2  x /day    ONLY    if needed for Anxiety Attack or sleep  &  limit to 5 days /week to avoid Addiction & Dementia Patient not taking: Reported on 07/07/2020 03/20/20   Lucky Cowboy, MD  atenolol (TENORMIN) 100  MG tablet Take 100 mg by mouth daily.    [provider]  Cholecalciferol (VITAMIN D3) 50 MCG (2000 UT) capsule Take 2,000 Units by mouth daily.    [provider]  hydrochlorothiazide (HYDRODIURIL) 25 MG tablet Take  1 tablet  Daily  for BP & Fluid Retention /Ankle Swelling Patient taking differently: Take 25 mg by mouth daily. Take  1 tablet  Daily  for BP & Fluid Retention /Ankle Swelling 05/14/20   Lucky Cowboy, MD  losartan (COZAAR) 100 MG tablet TAKE ONE TABLET BY MOUTH DAILY Patient taking differently: Take 100 mg by mouth daily. 10/08/19   Jodelle Gross, NP  nystatin (NYSTATIN) powder Apply daily after a shower as needed Patient taking differently: Apply 1 application topically daily as needed (rash). Apply daily after a shower as needed 05/03/20   [name], NP  Polyethyl Glycol-Propyl Glycol (SYSTANE) 0.4-0.3 % SOLN Apply 1 drop to eye daily as needed (dry eyes).    [provider]  rosuvastatin (CRESTOR) 40 MG tablet TAKE ONE TABLET BY MOUTH DAILY FOR CHOLESTEROL Patient taking differently: Take 40 mg by mouth daily. TAKE ONE TABLET BY MOUTH DAILY FOR CHOLESTEROL 05/15/20   [name], NP  senna (SENOKOT) 8.6 MG tablet Take 1 tablet by mouth every other day.    [provider]     Family History  Problem Relation Age of  Onset  . Hyperlipidemia Father   . Coronary artery disease Brother   . Obesity Brother     Social History   Socioeconomic History  . Marital status: Married    Spouse name: Not on file  . Number of children: 1  . Years of education: Not on file  . Highest education level: Not on file  Occupational History  . Occupation: Retired Teacher, early years/pre  Tobacco Use  . Smoking status: Former Smoker    Packs/day: 1.00    Years: 30.00    Pack years: 30.00    Types: Cigarettes    Quit date: 08/28/2004    Years since quitting: 15.8  . Smokeless tobacco: Never Used  Vaping Use  . Vaping Use: Never used   Substance and Sexual Activity  . Alcohol use: Yes    Alcohol/week: 14.0 standard drinks    Types: 14 Standard drinks or equivalent per week    Comment: Drinks 2-3 glasses wine nightly x 20 years  . Drug use: No  . Sexual activity: Not Currently    Birth control/protection: Post-menopausal  Other Topics Concern  . Not on file  Social History Narrative   Lives with husband in a two story home.  Has 1 child and 4 stepchildren. Retired Teacher, early years/pre.  Education: Masters.     Social Determinants of Health   Financial Resource Strain: Not on file  Food Insecurity: Not on file  Transportation Needs: Not on file  Physical Activity: Not on file  Stress: Not on file  Social Connections: Not on file     Review of Systems: A 12 point ROS discussed and pertinent positives are indicated in the HPI above.  All other systems are negative.   Vital Signs: BP (!) 186/86   Pulse 64   Temp 98.1 F (36.7 C) (Oral)   Resp 16   Ht 5' 5.5" (1.664 m)   Wt 214 lb (97.1 kg)   SpO2 99%   BMI 35.07 kg/m   Physical Exam Vitals and nursing note reviewed.  Constitutional:      General: She is not in acute distress.    Appearance: Normal appearance. She is not ill-appearing.  HENT:     Head: Normocephalic.  Cardiovascular:     Rate and Rhythm: Normal rate and regular rhythm.     Pulses: Normal pulses.     Heart sounds: Normal heart sounds.  Pulmonary:     Effort: Pulmonary effort is normal.     Breath sounds: Normal breath sounds.  Abdominal:     General: Abdomen is flat.     Palpations: Abdomen is soft.  Skin:    General: Skin is warm and dry.  Neurological:     Mental Status: She is alert and oriented to person, place, and time.  Psychiatric:        Mood and Affect: Mood normal.        Behavior: Behavior normal.        Judgment: Judgment normal.      MD Evaluation Airway: WNL Heart: WNL Abdomen: WNL Chest/ Lungs: WNL ASA  Classification: 2 Mallampati/Airway Score:  Two  Imaging: No results found.  Labs:  CBC: Recent Labs    10/23/19 1124 01/21/20 1635 05/03/20 0000 07/15/20 0609  WBC 8.4 11.8* 9.2 8.4  HGB 13.6 14.5 14.8 14.5  HCT 40.4 41.3 42.0 42.6  PLT 273 277 265 248    COAGS: Recent Labs    07/15/20 0609  INR 0.9  BMP: Recent Labs    07/17/19 1231 10/23/19 1124 01/21/20 1635 05/03/20 0000  NA 133* 133* 136 133*  K 4.7 4.4 5.0 4.3  CL 92* 93* 95* 92*  CO2 30 27 28 31   GLUCOSE 102* 108* 101* 97  BUN 16 18 18 12   CALCIUM 10.2 10.5* 10.5* 10.0  CREATININE 0.72 0.90 1.12* 0.80  GFRNONAA 81 62 48* 72  GFRAA 94 72 55* 83    LIVER FUNCTION TESTS: Recent Labs    07/17/19 1231 10/23/19 1124 01/21/20 1635 05/03/20 0000  BILITOT 0.6 0.8 0.6 0.9  AST 24 24 34 22  ALT 17 19 23 16   PROT 7.4 7.4 7.5 7.1    TUMOR MARKERS: No results for input(s): AFPTM, CEA, CA199, CHROMGRNA in the last 8760 hours.  Assessment and Plan: 75 y.o. female with persistent proteinuria with uncertain etiology.   Secondary/serology work up for the persistent proteinuria was unrevealing, therefore a random renal biopsy was recommend to the patient for further evaluation.  After thorough discussion and shared decision making, decided to undergo the biopsy. Patient presents to [facility] IR today for the procedure. N.p.o. since midnight Patient reports that she has not been taking aspirin 81 mg for more than a week. CBC stable INR 0.9 SBP 186 at 0630 hrs, hydralazine to be given prior to the procedure ans re check BP.   Risks and benefits of random renal biopsy was discussed with the patient and/or patient's family including, but not limited to bleeding, infection, damage to adjacent structures or low yield requiring additional tests.  All of the questions were answered and there is agreement to proceed.  Consent signed and in chart.    Thank you for this interesting consult.  I greatly enjoyed meeting Deanna Gomez and look forward to  participating in their care.  A copy of this report was sent to the requesting provider on this date.  Electronically Signed: 60, PA-C 07/15/2020, 8:12 AM   I spent a total of  30 Minutes   in face to face in clinical consultation, greater than 50% of which was counseling/coordinating care for random renal biopsy

## 2020-07-21 NOTE — Progress Notes (Signed)
Patient ID: Deanna Gomez, female   DOB: December 07, 1945, 75 y.o.   MRN: [id number]   MEDICARE VISIT AND 3 MONTH FOLLOW UP  Assessment:   Medicare Visit Due annually   Atherosclerosis of aorta Per CT 2018 Control blood pressure, cholesterol, glucose, increase exercise.   Diastolic CHF Grade 2 dysfunction, Weight stable/down, appears euvolemic Followed by cardiology  Essential hypertension - continue medications, DASH diet, exercise and monitor at home. Call if greater than 130/80.   Hepatitis C virus infection without hepatic coma, unspecified chronicity S/p treatment, in remission; monitor LFTs at routine OVs  Hepatic steatosis Weight loss advised, low processed carb diet, avoid alcohol/tylenol, will monitor LFTs  Hyperlipidemia -continue medications, compliance emphasized  - check lipids, decrease fatty foods, increase activity.   Other abnormal glucose (hx of prediabetes) Discussed general issues about diabetes pathophysiology and management., Educational material distributed., Suggested low cholesterol diet., Encouraged aerobic exercise., Discussed foot care., Reminded to get yearly retinal exam.  Morbid obesity - BMI 37 with htn, hyperlipidemia, fatty liver Long discussion about weight loss, diet, and exercise Recommended diet heavy in fruits and veggies and low in animal meats, cheeses, and dairy products, appropriate calorie intake Patient will work on getting back on metabolism regulation diet and start working Discussed appropriate weight for height and initial goal (200lb) Follow up at next visit  Vitamin D deficiency At goal at recent check; continue to recommend supplementation for goal of 60-100 Defer vitamin D level  Chronic midline back pain Hx of severe scoliosis with fusion 10+ years ago, sees a provider in [place]. Recently doing well.   Insomnia Uses xanax PRN rarely good sleep hygiene discussed, increase day time activity, try melatonin or  benadryl   History of colon polyps Follow up was due August 2021, was referred 04/2020, will have referral coordinator follow up on status High fiber diet encouraged  Osteopenia Last improved, get next DEXA 12/2021, continue Vit D and Ca, weight bearing exercises  B12 deficiency Hasn't started, start B12 and recheck next OV  Orders Placed This Encounter  Procedures  . CBC with Differential/Platelet  . COMPLETE METABOLIC PANEL WITH GFR  . Magnesium  . Lipid panel  . TSH     Future Appointments  Date Time Provider Department Center  11/03/2020 11:30 AM Lucky Cowboy, MD [facility] None  05/03/2021  2:00 PM [name], NP [facility] None  07/22/2021 11:00 AM [name], NP [facility] None     Plan:   During the course of the visit the patient was educated and counseled about appropriate screening and preventive services including:    Pneumococcal vaccine   Influenza vaccine  Td vaccine  Screening electrocardiogram  Bone densitometry screening  Colorectal cancer screening  Diabetes screening  Glaucoma screening  Nutrition counseling   Advanced directives: requested   Subjective:   Deanna Gomez is a 75 y.o. female who presents for AWV and 3 month follow up on htn, hyperlipidemia, weight, glucose, vitamin D. She also underwent curative treatment for Hepatitis C (1987).   Her husband of 36 years passed 01/04/2020 due to advanced cardiac disease. Now living by herself, feels she is doing well. Did get established with grief counselor and continues to follow. Doesn't want daily med. She has xanax 0.25-0.5 mg which she takes only if unable to fall asleep after waking up in the middle of the night or if very anxious due to storm. Typically takes 0.25 mg, 0-2 days per week. Has been out for some time.  Significant history include Chronic LBP from severe thoracolumbar scoliosis s/p DDD fusion surgery x 3.Established with local provider and benefits  from PT intermittently. Denies recent pain.   BMI is Body mass index is 34.74 kg/m., she was going to exercise club stopped with husband illness, plans to restart, stretching/strenght training at home daily, is working on diet/portions, Weight goal <200 lb.  Wt Readings from Last 3 Encounters:  07/22/20 212 lb (96.2 kg)  07/15/20 214 lb (97.1 kg)  05/03/20 216 lb 6.4 oz (98.2 kg)   She has had elevated blood pressure since 1996. Her blood pressure has been controlled at home, reports somewhat labile & today their BP is BP: 110/68  She does workout. She denies chest pain, shortness of breath, dizziness.   She has aortic atherosclerosis per CT 2018.  She is following with cardiology after ECHO in 2019 ordered due to exertional dyspnea demonstrated grade 2 diastolic dysfunction; BP and symptoms improved recently.   She denies dyspnea on exertion, orthopnea, paroxysmal nocturnal dyspnea and edema. Positive for none.  She is on cholesterol medication (rosuvastatin 40 mg daily, was off of med last check) and denies myalgias. Her cholesterol is not at goal. The cholesterol last visit was:  Lab Results  Component Value Date   CHOL 260 (H) 05/03/2020   HDL 106 05/03/2020   LDLCALC 121 (H) 05/03/2020   TRIG 214 (H) 05/03/2020   CHOLHDL 2.5 05/03/2020   She had prediabetes in 2012. She has been working on diet and exercise for glucose management, and denies foot ulcerations, hyperglycemia, hypoglycemia , increased appetite, nausea, paresthesia of the feet, polydipsia, polyuria, visual disturbances, vomiting and weight loss. Last A1C in the office was:  Lab Results  Component Value Date   HGBA1C 5.5 05/03/2020   Basecline CKD II, denies NSAID use, fluid intake varies, had proteinuria, was [facility], underwent biopsy 06/2020 which was benign, he added farxiga 10 mg to losartan 100 mg daily. Last GFR:  Lab Results  Component Value Date   GFRNONAA 72 05/03/2020    GFRNONAA 48 (L) 01/21/2020   GFRNONAA 62 10/23/2019   Patient is on Vitamin D supplement  Lab Results  Component Value Date   VD25OH 6842 05/03/2020     She admits didn't start on supplement, receptive to starting Lab Results  Component Value Date   VITAMINB12 254 05/03/2020      Medication Review: Current Outpatient Medications on File Prior to Visit  Medication Sig  . aspirin EC 81 MG tablet Take 81 mg by mouth daily.  Marland Kitchen. atenolol (TENORMIN) 100 MG tablet Take 100 mg by mouth daily.  . Cholecalciferol (VITAMIN D3) 50 MCG (2000 UT) capsule Take 2,000 Units by mouth daily.  . dapagliflozin propanediol (FARXIGA) 10 MG TABS tablet Take 1 tablet by mouth daily. For kidney protection.  . hydrochlorothiazide (HYDRODIURIL) 25 MG tablet Take  1 tablet  Daily  for BP & Fluid Retention /Ankle Swelling (Patient taking differently: Take 25 mg by mouth daily. Take  1 tablet  Daily  for BP & Fluid Retention /Ankle Swelling)  . losartan (COZAAR) 100 MG tablet TAKE ONE TABLET BY MOUTH DAILY (Patient taking differently: Take 100 mg by mouth daily.)  . nystatin (NYSTATIN) powder Apply daily after a shower as needed (Patient taking differently: Apply 1 application topically daily as needed (rash). Apply daily after a shower as needed)  . Polyethyl Glycol-Propyl Glycol (SYSTANE) 0.4-0.3 % SOLN Apply 1 drop to eye daily as  needed (dry eyes).  . rosuvastatin (CRESTOR) 40 MG tablet TAKE ONE TABLET BY MOUTH DAILY FOR CHOLESTEROL (Patient taking differently: Take 40 mg by mouth daily. TAKE ONE TABLET BY MOUTH DAILY FOR CHOLESTEROL)  . senna (SENOKOT) 8.6 MG tablet Take 1 tablet by mouth every other day.   No current facility-administered medications on file prior to visit.    Current Problems (verified) Patient Active Problem List   Diagnosis Date Noted  . Persistent proteinuria 05/07/2020  . B12 deficiency 05/04/2020  . History of colon polyps 07/17/2019  . Osteopenia 07/17/2019  . Constipation  01/06/2019  . Insomnia 07/04/2018  . Diastolic CHF (HCC) 12/11/2017  . Hepatic steatosis 12/26/2016  . Abdominal aortic atherosclerosis (HCC) 12/26/2016  . Morbid obesity (HCC) 10/15/2014  . Essential hypertension 03/09/2013  . Hyperlipidemia, mixed 03/09/2013  . Abnormal glucose 03/09/2013  . Vitamin D deficiency 03/09/2013  . History of hepatitis C 03/09/2013    Screening Tests Immunization History  Administered Date(s) Administered  . DTaP 03/06/2006  . Influenza Split 11/20/2013, 12/24/2014  . Influenza Whole 12/19/2012  . Influenza, High Dose Seasonal PF 01/04/2017, 12/10/2017, 11/08/2018, 12/04/2019  . PFIZER(Purple Top)SARS-COV-2 Vaccination 03/27/2019, 04/17/2019, 12/15/2019  . Pneumococcal Conjugate-13 05/13/2014  . Pneumococcal Polysaccharide-23 03/06/2008, 10/25/2016  . Typhoid Live 03/06/2004  . Zoster 06/11/2007    Preventative care: Last colonoscopy: 10/2014, due 10/2019, referred to Dr. Ewing Schlein but had to cancel due to husband, will refer back  MGM 12/08/2019 [facility] DEXA: 2010, 2018, 12/2019 - T -1.5 - improved from previous PAP 11/2014 DONE  Echo 2019 - grade 2 diastolic Stress test 2013  Tdap: 2008, declines Influenza: 11/2019 Pneumonia: 2018 Prevnar: 2016 Shingles: 2009, not interested in shingrix Covid 19: 3/3, 2021, pfizer   Names of Other Physician/Practitioners you currently use: 1. [facility] here for primary care 2. Dr. Hazle Quant, eye doctor, last visit 2022 3. Dr. Clide Dales, Dentist, last visit 2022, goes q30m 4. Dr. Emily Filbert, derm, last 2021, had to reschedule - will see this year  Patient Care Team: Lucky Cowboy, MD as PCP - General (Internal Medicine) [name], MD as PCP - Cardiology (Cardiology) Sharrell Ku, MD as Consulting Physician (Gastroenterology) Teodora Medici, MD as Consulting Physician (Gynecology) Elmon Else, MD as Consulting Physician (Dermatology) [name],  DO (Ophthalmology) Rehabilitation, Julien Girt as Physical Therapist (Physical Therapy)   Allergies Allergies  Allergen Reactions  . Fentanyl Other (See Comments)    Intolerlance    SURGICAL HISTORY She  has a past surgical history that includes Back surgery (1986); Breast reconstruction; and Cataract extraction. FAMILY HISTORY Her family history includes Coronary artery disease in her brother; Hyperlipidemia in her father; Obesity in her brother. SOCIAL HISTORY She  reports that she quit smoking about 15 years ago. Her smoking use included cigarettes. She has a 30.00 pack-year smoking history. She has never used smokeless tobacco. She reports current alcohol use of about 14.0 standard drinks of alcohol per week. She reports that she does not use drugs.  MEDICARE WELLNESS OBJECTIVES: Physical activity: Current Exercise Habits: The patient does not participate in regular exercise at present, Exercise limited by: None identified Cardiac risk factors: Cardiac Risk Factors include: advanced age (>34men, >19 women);dyslipidemia;hypertension;obesity (BMI >30kg/m2);sedentary lifestyle Depression/mood screen:   Depression screen [facility] 2/9 07/22/2020  Decreased Interest 0  Down, Depressed, Hopeless 1  PHQ - 2 Score 1  Altered sleeping -  Tired, decreased energy -  Change in appetite -  Feeling bad or failure about yourself  -  Trouble concentrating -  Moving slowly or fidgety/restless -  Suicidal thoughts -  PHQ-9 Score -  Difficult doing work/chores -    ADLs:  In your present state of health, do you have any difficulty performing the following activities: 07/22/2020 07/15/2020  Hearing? N N  Vision? N N  Difficulty concentrating or making decisions? N N  Walking or climbing stairs? N N  Dressing or bathing? N N  Doing errands, shopping? N -  Some recent data might be hidden     Cognitive Testing  Alert? Yes  Normal Appearance?Yes  Oriented to person? Yes  Place? Yes   Time?  Yes  Recall of three objects?  Yes  Can perform simple calculations? Yes  Displays appropriate judgment?Yes  Can read the correct time from a watch face?Yes  EOL planning: Does Patient Have a Medical Advance Directive?: Yes Type of Advance Directive: Healthcare Power of Attorney,Living will Does patient want to make changes to medical advance directive?: No - Patient declined Copy of Healthcare Power of Attorney in Chart?: No - copy requested      Review of Systems  Constitutional: Negative for malaise/fatigue and weight loss.  HENT: Negative for hearing loss and tinnitus.   Eyes: Negative for blurred vision and double vision.  Respiratory: Negative for cough, sputum production, shortness of breath and wheezing.   Cardiovascular: Negative for chest pain, palpitations, orthopnea, claudication, leg swelling and PND.  Gastrointestinal: Negative for abdominal pain, blood in stool, constipation, diarrhea, heartburn, melena, nausea and vomiting.  Genitourinary: Negative.   Musculoskeletal: Negative for falls, joint pain and myalgias.  Skin: Negative for rash.  Neurological: Negative for dizziness, tingling, sensory change, weakness and headaches.  Endo/Heme/Allergies: Negative for polydipsia.  Psychiatric/Behavioral: Negative for depression, memory loss, substance abuse and suicidal ideas. The patient is nervous/anxious (with storms) and has insomnia (occasional).   All other systems reviewed and are negative.    Objective:     BP 110/68   Pulse 60   Temp (!) 95.5 F (35.3 C)   Ht 5' 5.5" (1.664 m)   Wt 212 lb (96.2 kg)   SpO2 95%   BMI 34.74 kg/m   General Appearance: Well nourished, in no apparent distress. Eyes: PERRLA, EOMs, conjunctiva no swelling or erythema Sinuses: No Frontal/maxillary tenderness ENT/Mouth: L Ext aud canals clear, TM without erythema, bulging. No erythema, swelling, or exudate on post pharynx.  Tonsils not swollen or erythematous. Hearing normal.   Neck: Supple, thyroid normal.  Respiratory: Respiratory effort normal, BS equal bilaterally without rales, rhonchi, wheezing or stridor.  Cardio: RRR with no MRGs. Brisk peripheral pulses without edema.  Abdomen: Soft, + BS.  Non tender, no guarding, rebound, masses. She has non-tender easily reduced ventral hernia. Lymphatics: Non tender without lymphadenopathy.  Musculoskeletal: Full ROM, 5/5 strength, Normal gait Skin: Warm, dry without rashes, lesions, ecchymosis.  Neuro: Cranial nerves intact. No cerebellar symptoms.  Psych: Awake and oriented X 3, normal affect, Insight and Judgment appropriate.    Medicare Attestation I have personally reviewed: The patient's medical and social history Their use of alcohol, tobacco or illicit drugs Their current medications and supplements The patient's functional ability including ADLs,fall risks, home safety risks, cognitive, and hearing and visual impairment Diet and physical activities Evidence for depression or mood disorders  The patient's weight, height, BMI, and visual acuity have been recorded in the chart.  I have made referrals, counseling, and provided education to the patient based on review of the above and I have provided  the patient with a written personalized care plan for preventive services.       Dan Maker, NP   07/22/2020

## 2020-07-22 ENCOUNTER — Ambulatory Visit: Payer: Medicare PPO | Admitting: Adult Health

## 2020-07-22 ENCOUNTER — Encounter: Payer: Self-pay | Admitting: Adult Health

## 2020-07-22 ENCOUNTER — Other Ambulatory Visit: Payer: Self-pay

## 2020-07-22 VITALS — BP 110/68 | HR 60 | Temp 95.5°F | Ht 65.5 in | Wt 212.0 lb

## 2020-07-22 DIAGNOSIS — Z8619 Personal history of other infectious and parasitic diseases: Secondary | ICD-10-CM

## 2020-07-22 DIAGNOSIS — R7309 Other abnormal glucose: Secondary | ICD-10-CM

## 2020-07-22 DIAGNOSIS — R6889 Other general symptoms and signs: Secondary | ICD-10-CM | POA: Diagnosis not present

## 2020-07-22 DIAGNOSIS — K5904 Chronic idiopathic constipation: Secondary | ICD-10-CM

## 2020-07-22 DIAGNOSIS — E559 Vitamin D deficiency, unspecified: Secondary | ICD-10-CM

## 2020-07-22 DIAGNOSIS — Z0001 Encounter for general adult medical examination with abnormal findings: Secondary | ICD-10-CM

## 2020-07-22 DIAGNOSIS — I700 Atherosclerosis of aorta: Secondary | ICD-10-CM | POA: Diagnosis not present

## 2020-07-22 DIAGNOSIS — I5032 Chronic diastolic (congestive) heart failure: Secondary | ICD-10-CM | POA: Diagnosis not present

## 2020-07-22 DIAGNOSIS — K760 Fatty (change of) liver, not elsewhere classified: Secondary | ICD-10-CM

## 2020-07-22 DIAGNOSIS — G4700 Insomnia, unspecified: Secondary | ICD-10-CM

## 2020-07-22 DIAGNOSIS — I10 Essential (primary) hypertension: Secondary | ICD-10-CM

## 2020-07-22 DIAGNOSIS — E782 Mixed hyperlipidemia: Secondary | ICD-10-CM

## 2020-07-22 DIAGNOSIS — M8580 Other specified disorders of bone density and structure, unspecified site: Secondary | ICD-10-CM

## 2020-07-22 DIAGNOSIS — Z0000 Encounter for general adult medical examination without abnormal findings: Secondary | ICD-10-CM

## 2020-07-22 DIAGNOSIS — Z86010 Personal history of colonic polyps: Secondary | ICD-10-CM

## 2020-07-22 DIAGNOSIS — E538 Deficiency of other specified B group vitamins: Secondary | ICD-10-CM

## 2020-07-22 LAB — SURGICAL PATHOLOGY

## 2020-07-22 MED ORDER — ALPRAZOLAM 0.5 MG PO TABS: ORAL_TABLET | ORAL | 0 refills | Status: DC

## 2020-07-22 NOTE — Patient Instructions (Addendum)
  Deanna Gomez , Thank you for taking time to come for your Medicare Wellness Visit. I appreciate your ongoing commitment to your health goals. Please review the following plan we discussed and let me know if I can assist you in the future.   These are the goals we discussed: Goals    . Blood Pressure < 130/80    . Exercise 150 min/wk Moderate Activity    . HEMOGLOBIN A1C < 5.6    . LDL CALC < 100    . Weight (lb) < 200 lb (90.7 kg)       This is a list of the screening recommended for you and due dates:  Health Maintenance  Topic Date Due  . Tetanus Vaccine  05/03/2021*  . Flu Shot  10/04/2020  . DEXA scan (bone density measurement)  Completed  . COVID-19 Vaccine  Completed  . Hepatitis C Screening: USPSTF Recommendation to screen - Ages 70-79 yo.  Completed  . Pneumonia vaccines  Completed  . HPV Vaccine  Aged Out  *Topic was postponed. The date shown is not the original due date.      Suggest getting B12 supplement or B complex - was low when checked

## 2020-07-23 ENCOUNTER — Encounter (HOSPITAL_COMMUNITY): Payer: Self-pay

## 2020-07-23 LAB — CBC WITH DIFFERENTIAL/PLATELET
Absolute Monocytes: 754 cells/uL (ref 200–950)
Basophils Absolute: 46 cells/uL (ref 0–200)
Basophils Relative: 0.4 %
Eosinophils Absolute: 12 cells/uL — ABNORMAL LOW (ref 15–500)
Eosinophils Relative: 0.1 %
HCT: 41.2 % (ref 35.0–45.0)
Hemoglobin: 13.9 g/dL (ref 11.7–15.5)
Lymphs Abs: 1114 cells/uL (ref 850–3900)
MCH: 33.1 pg — ABNORMAL HIGH (ref 27.0–33.0)
MCHC: 33.7 g/dL (ref 32.0–36.0)
MCV: 98.1 fL (ref 80.0–100.0)
MPV: 9.6 fL (ref 7.5–12.5)
Monocytes Relative: 6.5 %
Neutro Abs: 9674 cells/uL — ABNORMAL HIGH (ref 1500–7800)
Neutrophils Relative %: 83.4 %
Platelets: 300 10*3/uL (ref 140–400)
RBC: 4.2 10*6/uL (ref 3.80–5.10)
RDW: 12.9 % (ref 11.0–15.0)
Total Lymphocyte: 9.6 %
WBC: 11.6 10*3/uL — ABNORMAL HIGH (ref 3.8–10.8)

## 2020-07-23 LAB — COMPLETE METABOLIC PANEL WITH GFR
AG Ratio: 1.5 (calc) (ref 1.0–2.5)
ALT: 17 U/L (ref 6–29)
AST: 24 U/L (ref 10–35)
Albumin: 4.3 g/dL (ref 3.6–5.1)
Alkaline phosphatase (APISO): 62 U/L (ref 37–153)
BUN: 15 mg/dL (ref 7–25)
CO2: 31 mmol/L (ref 20–32)
Calcium: 10.8 mg/dL — ABNORMAL HIGH (ref 8.6–10.4)
Chloride: 90 mmol/L — ABNORMAL LOW (ref 98–110)
Creat: 0.83 mg/dL (ref 0.60–0.93)
GFR, Est African American: 79 mL/min/{1.73_m2} (ref >=60)
GFR, Est Non African American: 68 mL/min/{1.73_m2} (ref >=60)
Globulin: 2.8 g/dL (calc) (ref 1.9–3.7)
Glucose, Bld: 107 mg/dL — ABNORMAL HIGH (ref 65–99)
Potassium: 5 mmol/L (ref 3.5–5.3)
Sodium: 132 mmol/L — ABNORMAL LOW (ref 135–146)
Total Bilirubin: 0.9 mg/dL (ref 0.2–1.2)
Total Protein: 7.1 g/dL (ref 6.1–8.1)

## 2020-07-23 LAB — TSH: TSH: 2.22 mIU/L (ref 0.40–4.50)

## 2020-07-23 LAB — MAGNESIUM: Magnesium: 1.6 mg/dL (ref 1.5–2.5)

## 2020-07-23 LAB — LIPID PANEL
Cholesterol: 235 mg/dL — ABNORMAL HIGH (ref <200)
HDL: 102 mg/dL (ref >=50)
LDL Cholesterol (Calc): 111 mg/dL (calc) — ABNORMAL HIGH
Non-HDL Cholesterol (Calc): 133 mg/dL (calc) — ABNORMAL HIGH (ref <130)
Total CHOL/HDL Ratio: 2.3 (calc) (ref <5.0)
Triglycerides: 111 mg/dL (ref <150)

## 2020-08-04 DIAGNOSIS — K635 Polyp of colon: Secondary | ICD-10-CM | POA: Diagnosis not present

## 2020-08-04 DIAGNOSIS — K5900 Constipation, unspecified: Secondary | ICD-10-CM | POA: Diagnosis not present

## 2020-08-04 DIAGNOSIS — Z86010 Personal history of colonic polyps: Secondary | ICD-10-CM | POA: Diagnosis not present

## 2020-08-23 DIAGNOSIS — E785 Hyperlipidemia, unspecified: Secondary | ICD-10-CM | POA: Diagnosis not present

## 2020-08-23 DIAGNOSIS — E871 Hypo-osmolality and hyponatremia: Secondary | ICD-10-CM | POA: Diagnosis not present

## 2020-08-23 DIAGNOSIS — N182 Chronic kidney disease, stage 2 (mild): Secondary | ICD-10-CM | POA: Diagnosis not present

## 2020-08-23 DIAGNOSIS — N051 Unspecified nephritic syndrome with focal and segmental glomerular lesions: Secondary | ICD-10-CM | POA: Diagnosis not present

## 2020-08-23 DIAGNOSIS — I129 Hypertensive chronic kidney disease with stage 1 through stage 4 chronic kidney disease, or unspecified chronic kidney disease: Secondary | ICD-10-CM | POA: Diagnosis not present

## 2020-08-25 DIAGNOSIS — N182 Chronic kidney disease, stage 2 (mild): Secondary | ICD-10-CM | POA: Diagnosis not present

## 2020-08-25 DIAGNOSIS — N051 Unspecified nephritic syndrome with focal and segmental glomerular lesions: Secondary | ICD-10-CM | POA: Diagnosis not present

## 2020-08-28 ENCOUNTER — Other Ambulatory Visit: Payer: Self-pay | Admitting: Internal Medicine

## 2020-09-15 NOTE — Progress Notes (Signed)
Assessment and Plan:   Meral was seen today for finger injury.  Diagnoses and all orders for this visit:  Essentail Hypertension - Continue Losartan and HCTZ, continue following DASH diet  and monitoring BP at home and if > 130/80 consistently call the office.    Finger injury, left, initial encounter - Continue to isolate finger, tape little and 4th fingers together. Pt declines an Xray.  Pt denies pain.  Will conservatively manage and if pt experiences any changes she is to call the office  Further disposition pending results of labs. Discussed med's effects and SE's.   Over 30 minutes of exam, counseling, chart review, and critical decision making was performed.   Future Appointments  Date Time Provider Department Center  11/03/2020 11:30 AM Lucky Cowboy, MD [facility] None  05/03/2021  2:00 PM [name], NP [facility] None  07/22/2021 11:00 AM [name], NP [facility] None    ------------------------------------------------------------------------------------------------------------------   HPI BP (!) 160/82   Pulse (!) 106   Temp (!) 96.4 F (35.8 C)   Wt 214 lb 12.8 oz (97.4 kg)   SpO2 96%   BMI 35.20 kg/m  75 y.o.female presents for possible broken finger, pt caught her finger when trying to get out of the car 2 weeks ago .  The little finger had pain x 1 hour then no further pain.  Has been isolating it but the finger does not bend .    Pt has hypertension and is currently taking Losartan 100mg  QD and HCTZ 25mg  QD.  She is checking her blood pressurere at [facility] and her Bps have been running 100-110/60's.  Recheck of BP today was 112/68 BP Readings from Last 3 Encounters:  09/16/20 (!) 160/82  07/22/20 110/68  07/15/20 117/61    Past Medical History:  Diagnosis Date   Abdominal aortic atherosclerosis (HCC) 12/26/2016   Noted incidentally on 12/24/2016 CT   Gastric ulcer 11/19/2014   Hepatic steatosis 12/26/2016   Noted on CT 12/24/2016 - moderate  degree without distinct lesions   Hepatitis C    HTN (hypertension)    Hyperlipidemia    Obesity    Pre-diabetes    Scoliosis    Tobacco use    stopped 6 years ago.   Vitamin D deficiency      Allergies  Allergen Reactions   Fentanyl Other (See Comments)    Intolerlance    Current Outpatient Medications on File Prior to Visit  Medication Sig   ALPRAZolam (XANAX) 0.5 MG tablet Take 1/2 - 1 tablet 1 to 2  x /day ONLY if needed for Anxiety Attack or sleep & limit to 5 days /week to avoid Addiction & Dementia   aspirin EC 81 MG tablet Take 81 mg by mouth daily.   atenolol (TENORMIN) 100 MG tablet Take 100 mg by mouth daily.   Cholecalciferol (VITAMIN D3) 50 MCG (2000 UT) capsule Take 2,000 Units by mouth daily.   dapagliflozin propanediol (FARXIGA) 10 MG TABS tablet Take 1 tablet by mouth daily. For kidney protection.   hydrochlorothiazide (HYDRODIURIL) 25 MG tablet Take  1 tablet  Daily  for BP & Fluid Retention /Ankle Swelling (Patient taking differently: Take 25 mg by mouth daily. Take  1 tablet  Daily  for BP & Fluid Retention /Ankle Swelling)   losartan (COZAAR) 100 MG tablet TAKE ONE TABLET BY MOUTH DAILY (Patient taking differently: Take 100 mg by mouth daily.)   nystatin (NYSTATIN) powder Apply daily after a shower as needed (Patient taking differently:  Apply 1 application topically daily as needed (rash). Apply daily after a shower as needed)   Polyethyl Glycol-Propyl Glycol (SYSTANE) 0.4-0.3 % SOLN Apply 1 drop to eye daily as needed (dry eyes).   rosuvastatin (CRESTOR) 40 MG tablet Take  1 tablet  Daily  for Cholesterol   senna (SENOKOT) 8.6 MG tablet Take 1 tablet by mouth every other day.   No current facility-administered medications on file prior to visit.    ROS: all negative except above.   Physical Exam:  BP (!) 160/82   Pulse (!) 106   Temp (!) 96.4 F (35.8 C)   Wt 214 lb 12.8 oz (97.4 kg)   SpO2 96%   BMI 35.20 kg/m   General Appearance: Well  nourished, in no apparent distress. Eyes: PERRLA, EOMs, conjunctiva no swelling or erythema Sinuses: No Frontal/maxillary tenderness ENT/Mouth: Ext aud canals clear, TMs without erythema, bulging. No erythema, swelling, or exudate on post pharynx.  Tonsils not swollen or erythematous. Hearing normal.  Neck: Supple, thyroid normal.  Respiratory: Respiratory effort normal, BS equal bilaterally without rales, rhonchi, wheezing or stridor.  Cardio: RRR with no MRGs. Brisk peripheral pulses without edema.  Abdomen: Soft, + BS.  Non tender, no guarding, rebound, hernias, masses. Lymphatics: Non tender without lymphadenopathy.  Musculoskeletal: Left 5th digit of hand is bruised and swollen, nontender, not displaced. Full ROM, 5/5 strength, normal gait.  Skin: Warm, dry without rashes, lesions. Bruising of left little finger Neuro: Cranial nerves intact. Normal muscle tone, no cerebellar symptoms. Sensation intact.  Psych: Awake and oriented X 3, normal affect, Insight and Judgment appropriate.     Revonda Humphrey, NP 12:15 PM [facility]

## 2020-09-16 ENCOUNTER — Encounter: Payer: Self-pay | Admitting: Nurse Practitioner

## 2020-09-16 ENCOUNTER — Other Ambulatory Visit: Payer: Self-pay

## 2020-09-16 ENCOUNTER — Ambulatory Visit: Payer: Medicare PPO | Admitting: Nurse Practitioner

## 2020-09-16 VITALS — BP 160/82 | HR 106 | Temp 96.4°F | Wt 214.8 lb

## 2020-09-16 DIAGNOSIS — S6992XA Unspecified injury of left wrist, hand and finger(s), initial encounter: Secondary | ICD-10-CM | POA: Diagnosis not present

## 2020-09-16 DIAGNOSIS — I10 Essential (primary) hypertension: Secondary | ICD-10-CM | POA: Diagnosis not present

## 2020-09-16 NOTE — Patient Instructions (Signed)
Finger Fracture, Adult °A finger fracture is a break in any of the finger bones. °What are the causes? °The main cause of finger fractures is injury, such as from sports, a fall, or closing your finger in a drawer or door. °What increases the risk? °The following factors may make you more likely to develop this condition: °Playing sports. °Workplace activities that involve machinery. °Having weak bones (osteoporosis). This condition makes your bones less dense and causes them to break easily. °What are the signs or symptoms? °The main symptoms of a fractured finger are pain, bruising, and swelling shortly after the injury. Other symptoms include: °Stiffness. °Exposed bones (compound fracture or open fracture), in severe cases. °How is this diagnosed? °This condition is diagnosed based on a physical exam, your medical history, and your symptoms. An X-ray will also be done to confirm the diagnosis. °How is this treated? °Treatment for this condition depends on the severity of the fracture. If the bones are still in place, the finger may be splinted to keep the finger still while it heals (immobilization). If several fingers are fractured, you may need a cast. A cast may be applied up to the elbow to keep your fingers and hand from moving. °If the bones are out of place, your health care provider may move them back into place manually or surgically. °You may also need to do physical therapy exercises to improve movement and strength in your finger. °Follow these instructions at home: °If you have a removable splint: ° °Wear the splint as told by your health care provider. Remove it only as told by your health care provider. °Check the skin around the splint every day. Tell your health care provider about any concerns. °Loosen the splint if your fingers tingle, become numb, or turn cold and blue. °Keep the splint clean and dry. °If you have a nonremovable cast: °Do not put pressure on any part of the cast until it is  fully hardened. This may take several hours. °Do not stick anything inside the cast to scratch your skin. Doing that increases your risk of infection. °Check the skin around the cast every day. Tell your health care provider about any concerns. °You may put lotion on dry skin around the edges of the cast. Do not put lotion on the skin underneath the cast. °Keep the cast clean and dry. °Bathing °Do not take baths, swim, or use a hot tub until your health care provider approves. Ask your health care provider if you may take showers. °If your splint or cast is not waterproof: °Do not let it get wet. °Cover it with a watertight covering when you take a bath or shower. °Managing pain, stiffness, and swelling °If directed, put ice on the injured area. To do this: °If you have a removable splint, remove it as told by your health care provider. °Put ice in a plastic bag. °Place a towel between your skin and the bag, or between your cast and the bag. °Leave the ice on for 20 minutes, 2-3 times a day. °Remove the ice if your skin turns bright red. This is very important. If you cannot feel pain, heat, or cold, you have a greater risk of damage to the area. °Move your fingers often to reduce stiffness and swelling. °Raise (elevate) the injured area above the level of your heart while you are sitting or lying down. °Activity °Ask your health care provider if the medicine prescribed to you requires you to avoid driving or using   machinery. °Do physical therapy exercises as told by your health care provider. °Return to your normal activities as told by your health care provider. Ask your health care provider what activities are safe for you. °Ask your health care provider when it is safe to drive if you have a splint or cast on your finger. °General instructions °Do not use any products that contain nicotine or tobacco. These products include cigarettes, chewing tobacco, and vaping devices, such as e-cigarettes. These can delay  bone healing. If you need help quitting, ask your health care provider. °Take over-the-counter and prescription medicines only as told by your health care provider. °Keep all follow-up visits. This is important. °Contact a health care provider if: °Your pain or swelling gets worse, even with treatment. °You have trouble moving your finger. °Get help right away if: °Your finger becomes numb or blue. °Summary °A finger fracture is a break in any of the finger bones. °Injury is the main cause of finger fractures. °Treatment for this condition depends on the severity of the fracture. °This information is not intended to replace advice given to you by your health care provider. Make sure you discuss any questions you have with your health care provider. °Document Revised: 02/10/2020 Document Reviewed: 12/30/2019 °Elsevier Patient Education © 2022 [employer] ° °

## 2020-09-17 ENCOUNTER — Other Ambulatory Visit: Payer: Self-pay | Admitting: Nurse Practitioner

## 2020-09-17 DIAGNOSIS — G4700 Insomnia, unspecified: Secondary | ICD-10-CM

## 2020-09-17 MED ORDER — ALPRAZOLAM 0.5 MG PO TABS: ORAL_TABLET | ORAL | 0 refills | Status: DC

## 2020-09-28 DIAGNOSIS — N182 Chronic kidney disease, stage 2 (mild): Secondary | ICD-10-CM | POA: Diagnosis not present

## 2020-09-29 DIAGNOSIS — N182 Chronic kidney disease, stage 2 (mild): Secondary | ICD-10-CM | POA: Diagnosis not present

## 2020-10-22 DIAGNOSIS — H43813 Vitreous degeneration, bilateral: Secondary | ICD-10-CM | POA: Diagnosis not present

## 2020-10-22 DIAGNOSIS — H02054 Trichiasis without entropian left upper eyelid: Secondary | ICD-10-CM | POA: Diagnosis not present

## 2020-10-22 DIAGNOSIS — D3131 Benign neoplasm of right choroid: Secondary | ICD-10-CM | POA: Diagnosis not present

## 2020-10-22 DIAGNOSIS — H52213 Irregular astigmatism, bilateral: Secondary | ICD-10-CM | POA: Diagnosis not present

## 2020-10-22 DIAGNOSIS — H02423 Myogenic ptosis of bilateral eyelids: Secondary | ICD-10-CM | POA: Diagnosis not present

## 2020-10-22 DIAGNOSIS — H1789 Other corneal scars and opacities: Secondary | ICD-10-CM | POA: Diagnosis not present

## 2020-11-03 ENCOUNTER — Ambulatory Visit: Payer: Medicare PPO | Admitting: Internal Medicine

## 2020-11-03 ENCOUNTER — Telehealth: Payer: Self-pay

## 2020-11-03 NOTE — Progress Notes (Addendum)
     R  E  S  C  H  E  D  U  L  E  D A  T  A  P  P  '  T               T  I  M  E   

## 2020-11-03 NOTE — Telephone Encounter (Signed)
Spoke to patient who informed me that she has been dizzy with a fall starting last Thursday. She reports that she really does not want to go for evaluation. I advised the patient that Dr. Oneta Rack feels that based on her symptoms she should be evaluated at her closest Emergency Department. The patient was advised to call 911 for any worsening, concerning, or new symptoms. The patient voiced verbal understanding.

## 2020-11-04 ENCOUNTER — Inpatient Hospital Stay (HOSPITAL_COMMUNITY)
Admission: EM | Admit: 2020-11-04 | Discharge: 2020-11-07 | DRG: 312 | Disposition: A | Discharge status: Home / Self Care | Payer: Medicare PPO | Attending: Family Medicine | Admitting: Family Medicine

## 2020-11-04 ENCOUNTER — Emergency Department (HOSPITAL_COMMUNITY): Payer: Medicare PPO

## 2020-11-04 ENCOUNTER — Other Ambulatory Visit: Payer: Self-pay

## 2020-11-04 ENCOUNTER — Encounter (HOSPITAL_COMMUNITY): Payer: Self-pay | Admitting: Emergency Medicine

## 2020-11-04 DIAGNOSIS — I4892 Unspecified atrial flutter: Secondary | ICD-10-CM | POA: Diagnosis present

## 2020-11-04 DIAGNOSIS — I10 Essential (primary) hypertension: Secondary | ICD-10-CM | POA: Diagnosis not present

## 2020-11-04 DIAGNOSIS — I480 Paroxysmal atrial fibrillation: Secondary | ICD-10-CM | POA: Diagnosis not present

## 2020-11-04 DIAGNOSIS — R079 Chest pain, unspecified: Secondary | ICD-10-CM

## 2020-11-04 DIAGNOSIS — K5900 Constipation, unspecified: Secondary | ICD-10-CM | POA: Diagnosis present

## 2020-11-04 DIAGNOSIS — Z87891 Personal history of nicotine dependence: Secondary | ICD-10-CM

## 2020-11-04 DIAGNOSIS — E861 Hypovolemia: Secondary | ICD-10-CM | POA: Diagnosis present

## 2020-11-04 DIAGNOSIS — Z7982 Long term (current) use of aspirin: Secondary | ICD-10-CM

## 2020-11-04 DIAGNOSIS — K760 Fatty (change of) liver, not elsewhere classified: Secondary | ICD-10-CM | POA: Diagnosis present

## 2020-11-04 DIAGNOSIS — Y92009 Unspecified place in unspecified non-institutional (private) residence as the place of occurrence of the external cause: Secondary | ICD-10-CM

## 2020-11-04 DIAGNOSIS — E782 Mixed hyperlipidemia: Secondary | ICD-10-CM | POA: Diagnosis present

## 2020-11-04 DIAGNOSIS — E559 Vitamin D deficiency, unspecified: Secondary | ICD-10-CM | POA: Diagnosis present

## 2020-11-04 DIAGNOSIS — W19XXXA Unspecified fall, initial encounter: Secondary | ICD-10-CM

## 2020-11-04 DIAGNOSIS — I4891 Unspecified atrial fibrillation: Secondary | ICD-10-CM | POA: Diagnosis present

## 2020-11-04 DIAGNOSIS — Z8249 Family history of ischemic heart disease and other diseases of the circulatory system: Secondary | ICD-10-CM | POA: Diagnosis not present

## 2020-11-04 DIAGNOSIS — I700 Atherosclerosis of aorta: Secondary | ICD-10-CM | POA: Diagnosis present

## 2020-11-04 DIAGNOSIS — R0781 Pleurodynia: Secondary | ICD-10-CM | POA: Diagnosis not present

## 2020-11-04 DIAGNOSIS — Z83438 Family history of other disorder of lipoprotein metabolism and other lipidemia: Secondary | ICD-10-CM

## 2020-11-04 DIAGNOSIS — E860 Dehydration: Secondary | ICD-10-CM | POA: Diagnosis present

## 2020-11-04 DIAGNOSIS — B1920 Unspecified viral hepatitis C without hepatic coma: Secondary | ICD-10-CM | POA: Diagnosis present

## 2020-11-04 DIAGNOSIS — N1832 Chronic kidney disease, stage 3b: Secondary | ICD-10-CM | POA: Diagnosis present

## 2020-11-04 DIAGNOSIS — N179 Acute kidney failure, unspecified: Secondary | ICD-10-CM | POA: Diagnosis present

## 2020-11-04 DIAGNOSIS — M546 Pain in thoracic spine: Secondary | ICD-10-CM | POA: Diagnosis not present

## 2020-11-04 DIAGNOSIS — I77810 Thoracic aortic ectasia: Secondary | ICD-10-CM | POA: Diagnosis present

## 2020-11-04 DIAGNOSIS — E1122 Type 2 diabetes mellitus with diabetic chronic kidney disease: Secondary | ICD-10-CM | POA: Diagnosis present

## 2020-11-04 DIAGNOSIS — R55 Syncope and collapse: Secondary | ICD-10-CM | POA: Diagnosis present

## 2020-11-04 DIAGNOSIS — R0789 Other chest pain: Secondary | ICD-10-CM | POA: Diagnosis not present

## 2020-11-04 DIAGNOSIS — I951 Orthostatic hypotension: Principal | ICD-10-CM | POA: Diagnosis present

## 2020-11-04 DIAGNOSIS — Z8711 Personal history of peptic ulcer disease: Secondary | ICD-10-CM | POA: Diagnosis not present

## 2020-11-04 DIAGNOSIS — Z79899 Other long term (current) drug therapy: Secondary | ICD-10-CM

## 2020-11-04 DIAGNOSIS — Z888 Allergy status to other drugs, medicaments and biological substances status: Secondary | ICD-10-CM | POA: Diagnosis not present

## 2020-11-04 DIAGNOSIS — E876 Hypokalemia: Secondary | ICD-10-CM | POA: Diagnosis present

## 2020-11-04 DIAGNOSIS — I129 Hypertensive chronic kidney disease with stage 1 through stage 4 chronic kidney disease, or unspecified chronic kidney disease: Secondary | ICD-10-CM | POA: Diagnosis present

## 2020-11-04 DIAGNOSIS — M545 Low back pain, unspecified: Secondary | ICD-10-CM | POA: Diagnosis not present

## 2020-11-04 DIAGNOSIS — E669 Obesity, unspecified: Secondary | ICD-10-CM | POA: Diagnosis present

## 2020-11-04 DIAGNOSIS — Z20822 Contact with and (suspected) exposure to covid-19: Secondary | ICD-10-CM | POA: Diagnosis present

## 2020-11-04 LAB — URINALYSIS, ROUTINE W REFLEX MICROSCOPIC
Bilirubin Urine: NEGATIVE
Glucose, UA: 500 mg/dL — AB
Ketones, ur: NEGATIVE mg/dL
Leukocytes,Ua: NEGATIVE
Nitrite: NEGATIVE
Protein, ur: 100 mg/dL — AB
Specific Gravity, Urine: <1.005 — ABNORMAL LOW (ref 1.005–1.030)
pH: 6.5 (ref 5.0–8.0)

## 2020-11-04 LAB — COMPREHENSIVE METABOLIC PANEL
ALT: 26 U/L (ref 0–44)
AST: 48 U/L — ABNORMAL HIGH (ref 15–41)
Albumin: 4.2 g/dL (ref 3.5–5.0)
Alkaline Phosphatase: 66 U/L (ref 38–126)
Anion gap: 13 (ref 5–15)
BUN: 22 mg/dL (ref 8–23)
CO2: 29 mmol/L (ref 22–32)
Calcium: 10.1 mg/dL (ref 8.9–10.3)
Chloride: 92 mmol/L — ABNORMAL LOW (ref 98–111)
Creatinine, Ser: 1.4 mg/dL — ABNORMAL HIGH (ref 0.44–1.00)
GFR, Estimated: 39 mL/min — ABNORMAL LOW (ref >60)
Glucose, Bld: 102 mg/dL — ABNORMAL HIGH (ref 70–99)
Potassium: 4.7 mmol/L (ref 3.5–5.1)
Sodium: 134 mmol/L — ABNORMAL LOW (ref 135–145)
Total Bilirubin: 2 mg/dL — ABNORMAL HIGH (ref 0.3–1.2)
Total Protein: 8 g/dL (ref 6.5–8.1)

## 2020-11-04 LAB — CBC
HCT: 43.2 % (ref 36.0–46.0)
Hemoglobin: 15 g/dL (ref 12.0–15.0)
MCH: 33.9 pg (ref 26.0–34.0)
MCHC: 34.7 g/dL (ref 30.0–36.0)
MCV: 97.5 fL (ref 80.0–100.0)
Platelets: 269 K/uL (ref 150–400)
RBC: 4.43 MIL/uL (ref 3.87–5.11)
RDW: 12.8 % (ref 11.5–15.5)
WBC: 9.4 K/uL (ref 4.0–10.5)
nRBC: 0 % (ref 0.0–0.2)

## 2020-11-04 LAB — TROPONIN I (HIGH SENSITIVITY)
Troponin I (High Sensitivity): 6 ng/L (ref <18)
Troponin I (High Sensitivity): 5 ng/L (ref <18)

## 2020-11-04 LAB — RESP PANEL BY RT-PCR (FLU A&B, COVID) ARPGX2
Influenza A by PCR: NEGATIVE
Influenza B by PCR: NEGATIVE
SARS Coronavirus 2 by RT PCR: NEGATIVE

## 2020-11-04 LAB — GLUCOSE, CAPILLARY: Glucose-Capillary: 107 mg/dL — ABNORMAL HIGH (ref 70–99)

## 2020-11-04 MED ORDER — ALPRAZOLAM 0.25 MG PO TABS
0.2500 mg | ORAL_TABLET | Freq: Every day | ORAL | Status: DC | PRN
Start: 2020-11-04 — End: 2020-11-07
  Administered 2020-11-04 – 2020-11-06 (×3): 0.5 mg via ORAL
  Filled 2020-11-04 (×3): qty 2

## 2020-11-04 MED ORDER — HYDRALAZINE HCL 20 MG/ML IJ SOLN: 5.0000 mg | Freq: Four times a day (QID) | INTRAMUSCULAR | Status: DC | PRN

## 2020-11-04 MED ORDER — ATENOLOL 25 MG PO TABS
100.0000 mg | ORAL_TABLET | Freq: Every day | ORAL | Status: DC
  Administered 2020-11-05 – 2020-11-07 (×3): 100 mg via ORAL
  Filled 2020-11-04 (×3): qty 4

## 2020-11-04 MED ORDER — SODIUM CHLORIDE 0.9 % IV SOLN: INTRAVENOUS | Status: DC

## 2020-11-04 MED ORDER — ROSUVASTATIN CALCIUM 10 MG PO TABS
40.0000 mg | ORAL_TABLET | Freq: Every day | ORAL | Status: DC
  Administered 2020-11-05 – 2020-11-06 (×2): 40 mg via ORAL
  Filled 2020-11-04 (×3): qty 4

## 2020-11-04 MED ORDER — ONDANSETRON HCL 4 MG/2ML IJ SOLN: 4.0000 mg | Freq: Four times a day (QID) | INTRAMUSCULAR | Status: DC | PRN

## 2020-11-04 MED ORDER — ASPIRIN EC 81 MG PO TBEC
81.0000 mg | DELAYED_RELEASE_TABLET | Freq: Every day | ORAL | Status: DC
  Administered 2020-11-05: 81 mg via ORAL
  Filled 2020-11-04: qty 1

## 2020-11-04 MED ORDER — ENOXAPARIN SODIUM 40 MG/0.4ML IJ SOSY
40.0000 mg | PREFILLED_SYRINGE | INTRAMUSCULAR | Status: DC
  Administered 2020-11-04: 40 mg via SUBCUTANEOUS
  Filled 2020-11-04: qty 0.4

## 2020-11-04 MED ORDER — ACETAMINOPHEN 325 MG PO TABS
650.0000 mg | ORAL_TABLET | Freq: Four times a day (QID) | ORAL | Status: DC | PRN
  Administered 2020-11-05 – 2020-11-06 (×2): 650 mg via ORAL
  Filled 2020-11-04 (×2): qty 2

## 2020-11-04 MED ORDER — INSULIN ASPART 100 UNIT/ML IJ SOLN
0.0000 [IU] | Freq: Three times a day (TID) | INTRAMUSCULAR | Status: DC
Start: 2020-11-05 — End: 2020-11-07

## 2020-11-04 MED ORDER — ACETAMINOPHEN 650 MG RE SUPP: 650.0000 mg | Freq: Four times a day (QID) | RECTAL | Status: DC | PRN

## 2020-11-04 MED ORDER — ONDANSETRON HCL 4 MG PO TABS: 4.0000 mg | ORAL_TABLET | Freq: Four times a day (QID) | ORAL | Status: DC | PRN

## 2020-11-04 MED ORDER — HYDRALAZINE HCL 20 MG/ML IJ SOLN
5.0000 mg | Freq: Four times a day (QID) | INTRAMUSCULAR | Status: DC | PRN
  Administered 2020-11-07 (×2): 5 mg via INTRAVENOUS
  Filled 2020-11-04 (×2): qty 1

## 2020-11-04 MED ORDER — POLYETHYLENE GLYCOL 3350 17 G PO PACK
17.0000 g | PACK | Freq: Every day | ORAL | Status: DC
  Administered 2020-11-04 – 2020-11-06 (×3): 17 g via ORAL
  Filled 2020-11-04 (×3): qty 1

## 2020-11-04 NOTE — ED Notes (Signed)
PureWick placed on patient. 

## 2020-11-04 NOTE — ED Provider Notes (Signed)
[facility]-EMERGENCY DEPT Provider Note   CSN: [id number] Arrival date & time: 11/04/20  1229     History Chief Complaint  Patient presents with   Fall   Back Pain   Near Syncope    Deanna Gomez is a 75 y.o. female.  Deanna Gomez lives alone.  She was constipated about a week ago, and she took a laxative.  She had multiple bowel movements, and when she was standing up, she had a syncopal episode and fell on her left side in the bathroom.  Since that time she has had pain at the inferolateral aspect of her left rib cage.  It has radiated some into her lower thoracic and upper lumbar spine.  Since that time, she has been largely confined to her bed because every time she stands up she becomes lightheaded and feels as if she will pass out.  She denies other symptoms associated with this including chest pain or discomfort, palpitations, shortness of breath, nausea, and diaphoresis.  She has no recorded history of atrial fibrillation and does not recall ever being diagnosed with this.  The history is provided by the patient.  Fall This is a new problem. Episode onset: one week ago. Episode frequency: one episode. The problem has been resolved. Pertinent negatives include no shortness of breath. Nothing aggravates the symptoms. Nothing relieves the symptoms. She has tried nothing for the symptoms. The treatment provided no relief.      Past Medical History:  Diagnosis Date   Abdominal aortic atherosclerosis (HCC) 12/26/2016   Noted incidentally on 12/24/2016 CT   Gastric ulcer 11/19/2014   Hepatic steatosis 12/26/2016   Noted on CT 12/24/2016 - moderate degree without distinct lesions   Hepatitis C    HTN (hypertension)    Hyperlipidemia    Obesity    Pre-diabetes    Scoliosis    Tobacco use    stopped 6 years ago.   Vitamin D deficiency     Patient Active Problem List   Diagnosis Date Noted   Persistent proteinuria 05/07/2020   B12 deficiency  05/04/2020   History of colon polyps 07/17/2019   Osteopenia 07/17/2019   Constipation 01/06/2019   Insomnia 07/04/2018   Diastolic CHF (HCC) 12/11/2017   Hepatic steatosis 12/26/2016   Abdominal aortic atherosclerosis (HCC) 12/26/2016   Morbid obesity (HCC) 10/15/2014   Essential hypertension 03/09/2013   Hyperlipidemia, mixed 03/09/2013   Abnormal glucose 03/09/2013   Vitamin D deficiency 03/09/2013   History of hepatitis C 03/09/2013    Past Surgical History:  Procedure Laterality Date   BACK SURGERY  1986   x 3   BREAST RECONSTRUCTION     x 2   CATARACT EXTRACTION     Bilateral     OB History   No obstetric history on file.     Family History  Problem Relation Age of Onset   Hyperlipidemia Father    Coronary artery disease Brother    Obesity Brother     Social History   Tobacco Use   Smoking status: Former    Packs/day: 1.00    Years: 30.00    Pack years: 30.00    Types: Cigarettes    Quit date: 08/28/2004    Years since quitting: 16.1   Smokeless tobacco: Never  Vaping Use   Vaping Use: Never used  Substance Use Topics   Alcohol use: Yes    Alcohol/week: 14.0 standard drinks    Types: 14 Standard drinks or  equivalent per week    Comment: Drinks 2-3 glasses wine nightly x 20 years   Drug use: No    Home Medications Prior to Admission medications   Medication Sig Start Date End Date Taking? Authorizing Provider  ALPRAZolam Prudy Feeler) 0.5 MG tablet Take    1/2 - 1 tablet    1 to 2  x /day    ONLY    if needed for Anxiety Attack or sleep  &  limit to 5 days /week to avoid Addiction & Dementia 09/17/20   Revonda Humphrey, NP  aspirin EC 81 MG tablet Take 81 mg by mouth daily.    [provider]  atenolol (TENORMIN) 100 MG tablet Take 100 mg by mouth daily.    [provider]  Cholecalciferol (VITAMIN D3) 50 MCG (2000 UT) capsule Take 2,000 Units by mouth daily.    [provider]  dapagliflozin propanediol (FARXIGA) 10 MG TABS tablet  Take 1 tablet by mouth daily. For kidney protection.    [provider]  hydrochlorothiazide (HYDRODIURIL) 25 MG tablet Take  1 tablet  Daily  for BP & Fluid Retention /Ankle Swelling Patient taking differently: Take 25 mg by mouth daily. Take  1 tablet  Daily  for BP & Fluid Retention /Ankle Swelling 05/14/20   Lucky Cowboy, MD  losartan (COZAAR) 100 MG tablet TAKE ONE TABLET BY MOUTH DAILY Patient taking differently: Take 100 mg by mouth daily. 10/08/19   Jodelle Gross, NP  nystatin (NYSTATIN) powder Apply daily after a shower as needed Patient taking differently: Apply 1 application topically daily as needed (rash). Apply daily after a shower as needed 05/03/20   [name], NP  Polyethyl Glycol-Propyl Glycol (SYSTANE) 0.4-0.3 % SOLN Apply 1 drop to eye daily as needed (dry eyes).    [provider]  rosuvastatin (CRESTOR) 40 MG tablet Take  1 tablet  Daily  for Cholesterol 08/28/20   Lucky Cowboy, MD  senna (SENOKOT) 8.6 MG tablet Take 1 tablet by mouth every other day.    [provider]    Allergies    Fentanyl  Review of Systems   Review of Systems  HENT:  Negative for ear pain and sore throat.   Eyes:  Negative for pain and visual disturbance.  Respiratory:  Negative for cough and shortness of breath.   Skin:  Negative for color change and rash.  All other systems reviewed and are negative.  Physical Exam Updated Vital Signs BP 124/71 (BP Location: Right Arm)   Pulse (!) 40   Temp 98.2 F (36.8 C) (Oral)   Resp 16   SpO2 100%   Physical Exam Vitals and nursing note reviewed.  Constitutional:      Appearance: She is well-developed.  HENT:     Head: Normocephalic and atraumatic.     Mouth/Throat:     Mouth: Mucous membranes are moist.  Eyes:     Extraocular Movements: Extraocular movements intact.     Conjunctiva/sclera: Conjunctivae normal.     Pupils: Pupils are equal, round, and reactive to light.  Cardiovascular:     Rate  and Rhythm: Normal rate. Rhythm irregular.     Heart sounds: Normal heart sounds.  Pulmonary:     Effort: Pulmonary effort is normal. No tachypnea.     Breath sounds: Normal breath sounds.  Abdominal:     Palpations: Abdomen is soft.     Tenderness: There is no abdominal tenderness.  Musculoskeletal:     Right  lower leg: No edema.     Left lower leg: No edema.  Skin:    General: Skin is warm and dry.  Neurological:     General: No focal deficit present.     Mental Status: She is alert and oriented to person, place, and time.     Cranial Nerves: No cranial nerve deficit.     Sensory: No sensory deficit.     Motor: No weakness.     Coordination: Coordination normal.  Psychiatric:        Mood and Affect: Mood normal.        Behavior: Behavior normal.    ED Results / Procedures / Treatments   Labs (all labs ordered are listed, but only abnormal results are displayed) Labs Reviewed  COMPREHENSIVE METABOLIC PANEL - Abnormal; Notable for the following components:      Result Value   Sodium 134 (*)    Chloride 92 (*)    Glucose, Bld 102 (*)    Creatinine, Ser 1.40 (*)    AST 48 (*)    Total Bilirubin 2.0 (*)    GFR, Estimated 39 (*)    All other components within normal limits  RESP PANEL BY RT-PCR (FLU A&B, COVID) ARPGX2  CBC  URINALYSIS, ROUTINE W REFLEX MICROSCOPIC  TROPONIN I (HIGH SENSITIVITY)  TROPONIN I (HIGH SENSITIVITY)    EKG EKG Interpretation  Date/Time:  Thursday November 04 2020 13:03:06 EDT Ventricular Rate:  99 PR Interval:    QRS Duration: 98 QT Interval:  376 QTC Calculation: 437 R Axis:   5 Text Interpretation: Atrial fibrillation Abnormal R-wave progression, early transition No history of atrial fibrillation on priors Premature ventricular complexes Confirmed by Pieter PartridgeWright, Teylor Wolven ([id number]) on 11/04/2020 1:22:56 PM  Radiology DG Ribs Unilateral W/Chest Left  Result Date: 11/04/2020 CLINICAL DATA:  Left rib pain after fall EXAM: LEFT RIBS AND CHEST - 3+  VIEW COMPARISON:  01/19/2014 FINDINGS: No fracture or other bone lesions are seen involving the ribs. There is no evidence of pneumothorax or pleural effusion. Both lungs are clear. Heart size and mediastinal contours are within normal limits. IMPRESSION: Negative. Electronically Signed   By: Duanne GuessNicholas  Plundo D.O.   On: 11/04/2020 14:58   DG Thoracic Spine 2 View  Result Date: 11/04/2020 CLINICAL DATA:  Low back pain after fall 1 week ago. EXAM: THORACIC SPINE 2 VIEWS COMPARISON:  None. FINDINGS: There is no evidence of thoracic spine fracture. Alignment is normal. No other significant bone abnormalities are identified. IMPRESSION: No acute abnormality is noted. Electronically Signed   By: Lupita RaiderJames  Green Jr M.D.   On: 11/04/2020 14:59    Procedures Procedures   Medications Ordered in ED Medications - No data to display  ED Course  I have reviewed the triage vital signs and the nursing notes.  Pertinent labs & imaging results that were available during my care of the patient were reviewed by me and considered in my medical decision making (see chart for details).  Clinical Course as of 11/04/20 1535  Thu Nov 04, 2020  1323 I spoke with Dr. [facility]oughMemon [facility]. [AW]    Clinical Course User Index [AW] Koleen DistanceWright, Nikeria Kalman G, MD   MDM Rules/Calculators/A&P                           Deanna CowmanSuzanne B Gomez presents after a syncopal episode and ongoing presyncope that has limited her ability to ambulate for 1 week.  She has minimal  other symptoms with the exception of some left-sided chest pain that occurred during her fall.  ED evaluation here revealed abnormal heart rhythm that might be atrial fibrillation, but she did have ectopy which made interpretation difficult.  Heart rate was recorded in the 40s at times, but I am not sure this is accurate due to the ectopy that was occurring.  Initial work-up was reassuring, but she will be admitted for further evaluation secondary to her symptoms.  I do not think this  represents a neurologic process.  She has a normal neurologic exam, and symptoms seem to be a lightheadedness rather than vertigo. Final Clinical Impression(s) / ED Diagnoses Final diagnoses:  Near syncope  Chest pain, unspecified type  Stage 3b chronic kidney disease (HCC)    Rx / DC Orders ED Discharge Orders     None        Koleen Distance, MD 11/04/20 1642

## 2020-11-04 NOTE — H&P (Signed)
History and Physical    Deanna Gomez NWG:[id number] DOB: 11/08/1945 DOA: 11/04/2020  PCP: Deanna Cowboy, MD  Patient coming from: home  I have personally briefly reviewed patient's old medical records in [facility]  Chief Complaint: syncope/pre-syncope  HPI: Deanna Gomez is a 75 y.o. female with medical history significant of hypertension, chronic kidney disease stage II, hyperlipidemia, prediabetes, reports that she was in her usual state of health when approximately a week ago she did take a laxative for constipation.  He subsequently had multiple bowel movements.  When standing up after using the commode, patient felt increasingly lightheaded and had a syncopal episode, falling on her left side in the bathroom.  She was eventually able to get up.  She reports that since her fall, she has been increasingly dizzy while standing and is unable to ambulate.  She does have some pain in her left chest from her fall.  She did not have any other shortness of breath, nausea, diaphoresis or palpitations.  She denies any double vision, blurry vision, unilateral weakness or numbness.  Since her symptoms are persisting, she came to the ER for evaluation.  ED Course: She was noted to have a mildly elevated creatinine from prior levels of 1.4.  CBC otherwise unremarkable.  Urinalysis did not show any pyuria.  EKG was read as possible atrial fibrillation, although on closer review appears to be more of a sinus with PACs.  Her cardiac enzymes were negative x2.  She has been referred for admission.  Review of Systems: Review of Systems  Constitutional:  Negative for chills and fever.  HENT:  Negative for congestion and sore throat.   Eyes:  Negative for blurred vision and double vision.  Respiratory:  Negative for cough and shortness of breath.   Cardiovascular:  Negative for chest pain, palpitations and leg swelling.  Gastrointestinal:  Positive for constipation and diarrhea. Negative for  abdominal pain, nausea and vomiting.  Genitourinary:  Negative for dysuria.  Musculoskeletal:  Positive for falls.  Neurological:  Positive for dizziness and weakness. Negative for sensory change, speech change and focal weakness.     Past Medical History:  Diagnosis Date   Abdominal aortic atherosclerosis (HCC) 12/26/2016   Noted incidentally on 12/24/2016 CT   Gastric ulcer 11/19/2014   Hepatic steatosis 12/26/2016   Noted on CT 12/24/2016 - moderate degree without distinct lesions   Hepatitis C    HTN (hypertension)    Hyperlipidemia    Obesity    Pre-diabetes    Scoliosis    Tobacco use    stopped 6 years ago.   Vitamin D deficiency     Past Surgical History:  Procedure Laterality Date   BACK SURGERY  1986   x 3   BREAST RECONSTRUCTION     x 2   CATARACT EXTRACTION     Bilateral    Social History:  reports that she quit smoking about 16 years ago. Her smoking use included cigarettes. She has a 30.00 pack-year smoking history. She has never used smokeless tobacco. She reports current alcohol use of about 14.0 standard drinks per week. She reports that she does not use drugs.  Allergies  Allergen Reactions   Fentanyl Other (See Comments)    Intolerlance    Family History  Problem Relation Age of Onset   Hyperlipidemia Father    Coronary artery disease Brother    Obesity Brother     Prior to Admission medications   Medication Sig Start Date  End Date Taking? Authorizing Provider  ALPRAZolam (XANAX) 0.5 MG tablet Take    1/2 - 1 tablet    1 to 2  x /day    ONLY    if needed for Anxiety Attack or sleep  &  limit to 5 days /week to avoid Addiction & Dementia Patient taking differently: Take 0.25-0.5 mg by mouth daily as needed for sleep or anxiety. Limit to 5 days /week to avoid Addiction & Dementia 09/17/20  Yes Deanna Gomez, Deanna W, NP  aspirin EC 81 MG tablet Take 81 mg by mouth daily.   Yes [provider]  atenolol (TENORMIN) 100 MG tablet Take 100 mg by mouth  daily.   Yes [provider]  Cholecalciferol (VITAMIN D3) 50 MCG (2000 UT) capsule Take 2,000 Units by mouth daily.   Yes [provider]  dapagliflozin propanediol (FARXIGA) 10 MG TABS tablet Take 1 tablet by mouth daily. For kidney protection.   Yes [provider]  hydrochlorothiazide (HYDRODIURIL) 25 MG tablet Take  1 tablet  Daily  for BP & Fluid Retention /Ankle Swelling Patient taking differently: Take 25 mg by mouth daily. Take  1 tablet  Daily  for BP & Fluid Retention /Ankle Swelling 05/14/20  Yes Deanna CowboyMcKeown, William, MD  losartan (COZAAR) 100 MG tablet TAKE ONE TABLET BY MOUTH DAILY Patient taking differently: Take 100 mg by mouth daily. 10/08/19  Yes Deanna Gomez, Deanna M, NP  nystatin (NYSTATIN) powder Apply daily after a shower as needed Patient taking differently: Apply 1 application topically daily as needed (rash). Apply daily after a shower as needed 05/03/20  Yes Corbett, Deanna SheldonAshley, NP  Polyethyl Glycol-Propyl Glycol (SYSTANE) 0.4-0.3 % SOLN Apply 1 drop to eye 3 (three) times daily as needed (dry eyes).   Yes [provider]  rosuvastatin (CRESTOR) 40 MG tablet Take  1 tablet  Daily  for Cholesterol 08/28/20  Yes Deanna CowboyMcKeown, William, MD    Physical Exam: Vitals:   11/04/20 1345 11/04/20 1500 11/04/20 1527 11/04/20 1630  BP: (!) 141/94 (!) 144/96 (!) 162/94 (!) 139/92  Pulse: 94  89 90  Resp: (!) 23 (!) 24 (!) 26 20  Temp:    97.6 F (36.4 C)  TempSrc:    Oral  SpO2: 96% 98% 98% 100%  Weight:    95.1 kg  Height:    5' 5.5" (1.664 Gomez)    Constitutional: NAD, calm, comfortable Eyes: PERRL, lids and conjunctivae normal ENMT: Mucous membranes are moist. Posterior pharynx clear of any exudate or lesions.Normal dentition.  Neck: normal, supple, no masses, no thyromegaly Respiratory: clear to auscultation bilaterally, no wheezing, no crackles. Normal respiratory effort. No accessory muscle use.  Cardiovascular: Regular rate and rhythm with PACs, no  murmurs / rubs / gallops. No extremity edema. 2+ pedal pulses. No carotid bruits.  Abdomen: no tenderness, no masses palpated. No hepatosplenomegaly. Bowel sounds positive.  Musculoskeletal: no clubbing / cyanosis. No joint deformity upper and lower extremities. Good ROM, no contractures. Normal muscle tone.  Skin: no rashes, lesions, ulcers. No induration Neurologic: CN 2-12 grossly intact. Sensation intact, DTR normal. Strength 5/5 in all 4.  Psychiatric: Normal judgment and insight. Alert and oriented x 3. Normal mood.    Labs on Admission: I have personally reviewed following labs and imaging studies  CBC: Recent Labs  Lab 11/04/20 1317  WBC 9.4  HGB 15.0  HCT 43.2  MCV 97.5  PLT 269   Basic Metabolic Panel: Recent Labs  Lab 11/04/20 1317  NA 134*  K 4.7  CL 92*  CO2 29  GLUCOSE 102*  BUN 22  CREATININE 1.40*  CALCIUM 10.1   GFR: Estimated Creatinine Clearance: 38.8 mL/min (A) (by C-G formula based on SCr of 1.4 mg/dL (H)). Liver Function Tests: Recent Labs  Lab 11/04/20 1317  AST 48*  ALT 26  ALKPHOS 66  BILITOT 2.0*  PROT 8.0  ALBUMIN 4.2   No results for input(s): LIPASE, AMYLASE in the last 168 hours. No results for input(s): AMMONIA in the last 168 hours. Coagulation Profile: No results for input(s): INR, PROTIME in the last 168 hours. Cardiac Enzymes: No results for input(s): CKTOTAL, CKMB, CKMBINDEX, TROPONINI in the last 168 hours. BNP (last 3 results) No results for input(s): PROBNP in the last 8760 hours. HbA1C: No results for input(s): HGBA1C in the last 72 hours. CBG: No results for input(s): GLUCAP in the last 168 hours. Lipid Profile: No results for input(s): CHOL, HDL, LDLCALC, TRIG, CHOLHDL, LDLDIRECT in the last 72 hours. Thyroid Function Tests: No results for input(s): TSH, T4TOTAL, FREET4, T3FREE, THYROIDAB in the last 72 hours. Anemia Panel: No results for input(s): VITAMINB12, FOLATE, FERRITIN, TIBC, IRON, RETICCTPCT in the last  72 hours. Urine analysis:    Component Value Date/Time   COLORURINE YELLOW 11/04/2020 1621   APPEARANCEUR CLOUDY (A) 11/04/2020 1621   LABSPEC <1.005 (L) 11/04/2020 1621   PHURINE 6.5 11/04/2020 1621   GLUCOSEU 500 (A) 11/04/2020 1621   HGBUR SMALL (A) 11/04/2020 1621   BILIRUBINUR NEGATIVE 11/04/2020 1621   KETONESUR NEGATIVE 11/04/2020 1621   PROTEINUR 100 (A) 11/04/2020 1621   NITRITE NEGATIVE 11/04/2020 1621   LEUKOCYTESUR NEGATIVE 11/04/2020 1621    Radiological Exams on Admission: DG Ribs Unilateral Gomez/Chest Left  Result Date: 11/04/2020 CLINICAL DATA:  Left rib pain after fall EXAM: LEFT RIBS AND CHEST - 3+ VIEW COMPARISON:  01/19/2014 FINDINGS: No fracture or other bone lesions are seen involving the ribs. There is no evidence of pneumothorax or pleural effusion. Both lungs are clear. Heart size and mediastinal contours are within normal limits. IMPRESSION: Negative. Electronically Signed   By: Duanne Guess D.O.   On: 11/04/2020 14:58   DG Thoracic Spine 2 View  Result Date: 11/04/2020 CLINICAL DATA:  Low back pain after fall 1 week ago. EXAM: THORACIC SPINE 2 VIEWS COMPARISON:  None. FINDINGS: There is no evidence of thoracic spine fracture. Alignment is normal. No other significant bone abnormalities are identified. IMPRESSION: No acute abnormality is noted. Electronically Signed   By: Lupita Raider Gomez.D.   On: 11/04/2020 14:59    EKG: Independently reviewed. Appears to be sinus rhythm with frequent pac  Assessment/Plan Active Problems:   Essential hypertension   Hyperlipidemia, mixed   Syncope   AKI (acute kidney injury) (HCC)   Fall at home, initial encounter     Syncope -Patient had an episode of syncope earlier this week and has had recurrent dizziness upon standing since then. -Orthostatics checked on admission were not significantly abnormal -I suspect that her symptoms may be related to some degree of dehydration since her creatinine is elevated and she was  having frequent stools -We will provide gentle hydration overnight -Cardiac enzymes have been negative -Check echocardiogram  Abnormal EKG -Noted to be possible atrial fibrillation, but on closer review appears to be sinus rhythm with PACs -Checking echocardiogram -We will repeat EKG in a.Gomez.  Hypertension -Continue on atenolol -Holding losartan and hydrochlorothiazide due to elevated creatinine  Hyperlipidemia -Continue statin  Fall -We will  get physical therapy evaluation  Prediabetes -Hold Farxiga -Check A1c -SSI  DVT prophylaxis: lovenox  Code Status: full code  Family Communication: discussed with patient  Disposition Plan: probable discharge home when work up complete  Consults called:   Admission status: observation, telemetry   Erick Blinks MD [facility]   If 7PM-7AM, please contact night-coverage [email or web address]   11/04/2020, 5:51 PM

## 2020-11-04 NOTE — ED Triage Notes (Addendum)
Patient reports x1 week ago syncopal episode and fall in bathroom in the middle of the night. Since that time, patient c/o multiple near syncopal episodes upon standing and back pain. Patient HR in triage noted to range from 37 to 91.

## 2020-11-04 NOTE — ED Notes (Signed)
Pt transported to xray 

## 2020-11-05 ENCOUNTER — Observation Stay (HOSPITAL_BASED_OUTPATIENT_CLINIC_OR_DEPARTMENT_OTHER): Payer: Medicare PPO

## 2020-11-05 DIAGNOSIS — N179 Acute kidney failure, unspecified: Secondary | ICD-10-CM | POA: Diagnosis not present

## 2020-11-05 DIAGNOSIS — I10 Essential (primary) hypertension: Secondary | ICD-10-CM | POA: Diagnosis not present

## 2020-11-05 DIAGNOSIS — R55 Syncope and collapse: Secondary | ICD-10-CM | POA: Diagnosis not present

## 2020-11-05 DIAGNOSIS — E782 Mixed hyperlipidemia: Secondary | ICD-10-CM

## 2020-11-05 DIAGNOSIS — N1832 Chronic kidney disease, stage 3b: Secondary | ICD-10-CM

## 2020-11-05 LAB — CBC
HCT: 37.8 % (ref 36.0–46.0)
Hemoglobin: 13.1 g/dL (ref 12.0–15.0)
MCH: 33.8 pg (ref 26.0–34.0)
MCHC: 34.7 g/dL (ref 30.0–36.0)
MCV: 97.4 fL (ref 80.0–100.0)
Platelets: 239 10*3/uL (ref 150–400)
RBC: 3.88 MIL/uL (ref 3.87–5.11)
RDW: 12.6 % (ref 11.5–15.5)
WBC: 8.1 10*3/uL (ref 4.0–10.5)
nRBC: 0 % (ref 0.0–0.2)

## 2020-11-05 LAB — COMPREHENSIVE METABOLIC PANEL
ALT: 17 U/L (ref 0–44)
AST: 22 U/L (ref 15–41)
Albumin: 3.6 g/dL (ref 3.5–5.0)
Alkaline Phosphatase: 54 U/L (ref 38–126)
Anion gap: 13 (ref 5–15)
BUN: 24 mg/dL — ABNORMAL HIGH (ref 8–23)
CO2: 26 mmol/L (ref 22–32)
Calcium: 9.4 mg/dL (ref 8.9–10.3)
Chloride: 94 mmol/L — ABNORMAL LOW (ref 98–111)
Creatinine, Ser: 1.34 mg/dL — ABNORMAL HIGH (ref 0.44–1.00)
GFR, Estimated: 41 mL/min — ABNORMAL LOW (ref >60)
Glucose, Bld: 92 mg/dL (ref 70–99)
Potassium: 2.5 mmol/L — CL (ref 3.5–5.1)
Sodium: 133 mmol/L — ABNORMAL LOW (ref 135–145)
Total Bilirubin: 0.9 mg/dL (ref 0.3–1.2)
Total Protein: 6.6 g/dL (ref 6.5–8.1)

## 2020-11-05 LAB — HEMOGLOBIN A1C
Hgb A1c MFr Bld: 5.7 % — ABNORMAL HIGH (ref 4.8–5.6)
Mean Plasma Glucose: 116.89 mg/dL

## 2020-11-05 LAB — ECHOCARDIOGRAM COMPLETE
AR max vel: 3.46 cm2
AV Area VTI: 4.23 cm2
AV Area mean vel: 3.75 cm2
AV Mean grad: 5 mmHg
AV Peak grad: 8.9 mmHg
Ao pk vel: 1.49 m/s
Area-P 1/2: 3.73 cm2
Height: 65.5 in
S' Lateral: 2.7 cm
Single Plane A4C EF: 55.3 %
Weight: 3355.22 oz

## 2020-11-05 LAB — GLUCOSE, CAPILLARY
Glucose-Capillary: 105 mg/dL — ABNORMAL HIGH (ref 70–99)
Glucose-Capillary: 108 mg/dL — ABNORMAL HIGH (ref 70–99)
Glucose-Capillary: 114 mg/dL — ABNORMAL HIGH (ref 70–99)
Glucose-Capillary: 124 mg/dL — ABNORMAL HIGH (ref 70–99)

## 2020-11-05 LAB — TSH: TSH: 2.487 u[IU]/mL (ref 0.350–4.500)

## 2020-11-05 LAB — MAGNESIUM: Magnesium: 1.8 mg/dL (ref 1.7–2.4)

## 2020-11-05 MED ORDER — POTASSIUM CHLORIDE 10 MEQ/100ML IV SOLN
10.0000 meq | INTRAVENOUS | Status: AC
  Administered 2020-11-05 (×4): 10 meq via INTRAVENOUS
  Filled 2020-11-05 (×4): qty 100

## 2020-11-05 MED ORDER — SENNA 8.6 MG PO TABS
1.0000 | ORAL_TABLET | Freq: Every day | ORAL | Status: DC | PRN
  Administered 2020-11-06: 8.6 mg via ORAL
  Filled 2020-11-05: qty 1

## 2020-11-05 MED ORDER — APIXABAN 5 MG PO TABS
5.0000 mg | ORAL_TABLET | Freq: Two times a day (BID) | ORAL | Status: DC
  Administered 2020-11-05 – 2020-11-07 (×4): 5 mg via ORAL
  Filled 2020-11-05 (×4): qty 1

## 2020-11-05 MED ORDER — MAGNESIUM SULFATE IN D5W 1-5 GM/100ML-% IV SOLN
1.0000 g | Freq: Once | INTRAVENOUS | Status: AC
  Administered 2020-11-05: 1 g via INTRAVENOUS
  Filled 2020-11-05: qty 100

## 2020-11-05 NOTE — Discharge Instructions (Signed)

## 2020-11-05 NOTE — TOC Initial Note (Signed)
Transition of Care [facility]) - Initial/Assessment Note    Patient Details  Name: Deanna Gomez MRN: [id number] Date of Birth: [birth year]  Transition of Care [facility]) CM/SW Contact:    Ida Rogue, LCSW Phone Number: 11/05/2020, 3:15 PM  Clinical Narrative:  Patient is in need of RW per PT.  Order seen and appreciated.  Contacted Danielle with [facility] who will arrange for delivery of same to patient's room. TOC will continue to follow during the course of hospitalization.                  Expected Discharge Plan: Home/Self Care Barriers to Discharge: No Barriers Identified   Patient Goals and CMS Choice        Expected Discharge Plan and Services Expected Discharge Plan: Home/Self Care                                              Prior Living Arrangements/Services                       Activities of Daily Living Home Assistive Devices/Equipment: Eyeglasses ADL Screening (condition at time of admission) Patient's cognitive ability adequate to safely complete daily activities?: Yes Is the patient deaf or have difficulty hearing?: No Does the patient have difficulty seeing, even when wearing glasses/contacts?: No Does the patient have difficulty concentrating, remembering, or making decisions?: No Patient able to express need for assistance with ADLs?: Yes Does the patient have difficulty dressing or bathing?: Yes Independently performs ADLs?: No Communication: Independent Dressing (OT): Needs assistance Is this a change from baseline?: Change from baseline, expected to last >3 days Grooming: Needs assistance Is this a change from baseline?: Change from baseline, expected to last >3 days Feeding: Needs assistance Is this a change from baseline?: Change from baseline, expected to last >3 days Bathing: Needs assistance Is this a change from baseline?: Change from baseline, expected to last >3 days Toileting: Needs assistance Is this a change from  baseline?: Change from baseline, expected to last >3days In/Out Bed: Needs assistance Is this a change from baseline?: Change from baseline, expected to last >3 days Walks in Home: Needs assistance Is this a change from baseline?: Change from baseline, expected to last >3 days Does the patient have difficulty walking or climbing stairs?: Yes Weakness of Legs: Both Weakness of Arms/Hands: None  Permission Sought/Granted                  Emotional Assessment              Admission diagnosis:  Syncope [R55] Near syncope [R55] Chest pain, unspecified type [R07.9] Stage 3b chronic kidney disease (HCC) [N18.32] Patient Active Problem List   Diagnosis Date Noted   Syncope 11/04/2020   AKI (acute kidney injury) (HCC) 11/04/2020   Fall at home, initial encounter 11/04/2020   Persistent proteinuria 05/07/2020   B12 deficiency 05/04/2020   History of colon polyps 07/17/2019   Osteopenia 07/17/2019   Constipation 01/06/2019   Insomnia 07/04/2018   Diastolic CHF (HCC) 12/11/2017   Hepatic steatosis 12/26/2016   Abdominal aortic atherosclerosis (HCC) 12/26/2016   Morbid obesity (HCC) 10/15/2014   Essential hypertension 03/09/2013   Hyperlipidemia, mixed 03/09/2013   Abnormal glucose 03/09/2013   Vitamin D deficiency 03/09/2013   History of hepatitis C 03/09/2013   PCP:  Lucky Cowboy,  MD Pharmacy:   [facility] [id number] - [address], [place] - [address] [place] [address] Phone: [phone] Fax: [phone]     Social Determinants of Health (SDOH) Interventions    Readmission Risk Interventions No flowsheet data found.

## 2020-11-05 NOTE — Evaluation (Addendum)
Physical Therapy Evaluation Patient Details Name: Deanna Gomez MRN: [id number] DOB: 09-24-1945 Today's Date: 11/05/2020   History of Present Illness  75 y.o. female  reports that she was in her usual state of health when approximately 10 days ago she did take a laxative for constipation.  She subsequently had multiple bowel movements.  When standing up after using the commode, patient felt increasingly lightheaded and had a syncopal episode. Dx of dehydration, hypokalemia.  Pt with medical history significant of hypertension, chronic kidney disease stage II, hyperlipidemia, prediabetes.  Clinical Impression  Noted potassium is 2.5 today, pt was asymptomatic throughout PT session. She independently performed bed mobility and transfers, she ambulated 160' with a RW without loss of balance, she denied dizziness with position changes. HR 90s with ambulation. No further PT indicated as she is mobilizing independently. I do recommend pt have supervision when walking here in the hospital due to recent syncopal event.     Follow Up Recommendations No PT follow up; supervision for mobility during acute stay    Equipment Recommendations  Rolling walker with 5" wheels    Recommendations for Other Services       Precautions / Restrictions Precautions Precautions: Fall Precaution Comments: syncopal episode a week and a half ago, no other falls in past 1 year Restrictions Weight Bearing Restrictions: No      Mobility  Bed Mobility Overal bed mobility: Independent                  Transfers Overall transfer level: Independent                  Ambulation/Gait Ambulation/Gait assistance: Modified independent (Device/Increase time) Gait Distance (Feet): 160 Feet Assistive device: Rolling walker (2 wheeled) Gait Pattern/deviations: WFL(Within Functional Limits) Gait velocity: WNL   General Gait Details: steady with RW, no loss of balance, HR 90s, denied  dizziness  Stairs            Wheelchair Mobility    Modified Rankin (Stroke Patients Only)       Balance Overall balance assessment: Modified Independent                                           Pertinent Vitals/Pain Pain Assessment: No/denies pain    Home Living Family/patient expects to be discharged to:: Private residence Living Arrangements: Alone Available Help at Discharge: Available PRN/intermittently;Family   Home Access: Stairs to enter   Secretary/administrator of Steps: 2 Home Layout: Two level Home Equipment: None      Prior Function Level of Independence: Independent         Comments: independent with stairs, and ADLs, drives     Hand Dominance        Extremity/Trunk Assessment   Upper Extremity Assessment Upper Extremity Assessment: Overall WFL for tasks assessed    Lower Extremity Assessment Lower Extremity Assessment: RLE deficits/detail;LLE deficits/detail;Overall WFL for tasks assessed RLE Deficits / Details: reports onset of numbness on bottom of B feet started about 2 weeks ago RLE Sensation: decreased light touch LLE Sensation: decreased light touch    Cervical / Trunk Assessment Cervical / Trunk Assessment: Other exceptions Cervical / Trunk Exceptions: h/o scoliosis  Communication   Communication: No difficulties  Cognition Arousal/Alertness: Awake/alert Behavior During Therapy: WFL for tasks assessed/performed Overall Cognitive Status: Within Functional Limits for tasks assessed  General Comments      Exercises     Assessment/Plan    PT Assessment Patent does not need any further PT services  PT Problem List         PT Treatment Interventions      PT Goals (Current goals can be found in the Care Plan section)  Acute Rehab PT Goals PT Goal Formulation: All assessment and education complete, DC therapy    Frequency     Barriers to  discharge        Co-evaluation               AM-PAC PT "6 Clicks" Mobility  Outcome Measure Help needed turning from your back to your side while in a flat bed without using bedrails?: None Help needed moving from lying on your back to sitting on the side of a flat bed without using bedrails?: None Help needed moving to and from a bed to a chair (including a wheelchair)?: None Help needed standing up from a chair using your arms (e.g., wheelchair or bedside chair)?: None Help needed to walk in hospital room?: None Help needed climbing 3-5 steps with a railing? : None 6 Click Score: 24    End of Session Equipment Utilized During Treatment: Gait belt Activity Tolerance: Patient tolerated treatment well Patient left: in chair;with call bell/phone within reach;with family/visitor present;with chair alarm set Nurse Communication: Mobility status      Time: 5974-1638 PT Time Calculation (min) (ACUTE ONLY): 25 min   Charges:   PT Evaluation $PT Eval Low Complexity: 1 Low PT Treatments $Gait Training: 8-22 mins       Ralene Bathe Kistler PT 11/05/2020  Acute Rehabilitation Services Pager [phone] Office [phone]

## 2020-11-05 NOTE — Progress Notes (Signed)
Critical Lab: Potassium 2.5  MD Paged. Awaiting new orders.

## 2020-11-05 NOTE — Progress Notes (Signed)
ANTICOAGULATION CONSULT NOTE - Initial Consult  Pharmacy Consult for apixaban Indication: atrial fibrillation  Allergies  Allergen Reactions   Fentanyl Other (See Comments)    Intolerlance    Patient Measurements: Height: 5' 5.5" (166.4 cm) Weight: 95.1 kg (209 lb 11.2 oz) IBW/kg (Calculated) : 58.15  Vital Signs: Temp: 98.3 F (36.8 C) (09/02 1451) Temp Source: Oral (09/02 1451) BP: 143/82 (09/02 1451) Pulse Rate: 97 (09/02 1451)  Labs: Recent Labs    11/04/20 1317 11/04/20 1513 11/05/20 0532  HGB 15.0  --  13.1  HCT 43.2  --  37.8  PLT 269  --  239  CREATININE 1.40*  --  1.34*  TROPONINIHS 6 5  --     Estimated Creatinine Clearance: 40.5 mL/min (A) (by C-G formula based on SCr of 1.34 mg/dL (H)).   Medical History: Past Medical History:  Diagnosis Date   Abdominal aortic atherosclerosis (HCC) 12/26/2016   Noted incidentally on 12/24/2016 CT   Gastric ulcer 11/19/2014   Hepatic steatosis 12/26/2016   Noted on CT 12/24/2016 - moderate degree without distinct lesions   Hepatitis C    HTN (hypertension)    Hyperlipidemia    Obesity    Pre-diabetes    Scoliosis    Tobacco use    stopped 6 years ago.   Vitamin D deficiency     Medications:  No listed anticoagulation medications prior to admission  Assessment: 75 yo female who was admitted for syncope likely from multiple bowel movements, dehydration with new onset atrial fibrillation and CHA2DS2VASc score is 5.  Body weight greater than 60 kg and serum creatinine less than 1.5.  Goal of Therapy:  Prevent stroke and systemic embolism Monitor platelets by anticoagulation protocol: Yes   Plan:  Apixaban 5 mg po BID Monitor CBC, s/s bleeding   Lynden Ang, PharmD, BCPS 11/05/2020,6:12 PM

## 2020-11-05 NOTE — Progress Notes (Signed)
[facility]  PROGRESS NOTE  [name] OHY:[id number] DOB: [birth year] DOA: 11/04/2020 PCP: Lucky Cowboy, MD   Brief HPI:   75 year old female with medical history of hypertension, chronic kidney disease stage II, hyperlipidemia, prediabetes who has been taking laxative for constipation.  Patient states that she had multiple bowel movements.  When standing up from commode she felt increasingly lightheaded and had a syncopal episode.  She fell in the bathroom.  She was eventually able to get up.  And has been feeling dizzy.  In the ED she was found to have positive orthostatic hypotension.    Subjective   Patient seen and examined, denies dizziness at this time.  No chest pain or shortness of breath.     Assessment/Plan:    Syncope -Likely from multiple bowel movements, dehydration -Orthostatics on admission were abnormal -Echocardiogram obtained yesterday shows EF 60%, grade 1 diastolic dysfunction. -Continue normal saline at 100 mill per hour -Patient is on multiple and hypertensive medications, will continue to hold losartan, HCTZ at this time  Hypertension -Patient on atenolol, HCTZ, losartan at home. -I do not think she needs all these medications, blood pressure is fairly well controlled on atenolol -Continue to hold HCTZ and losartan. -We will monitor  Hypokalemia -Patient has severe hypokalemia with potassium of 2.5 -Serum magnesium was checked was 1.8 -We will give magnesium sulfate 1 g IV x1 -Replace potassium with IV KCl 10 mEq x 5 -Follow BMP in am  Diabetes mellitus type 2 -Continue sliding scale insulin NovoLog -CBG well controlled  New onset atrial fibrillation -EKG shows atrial fibrillation - CHA2DS2VASc  score is 5. Will need anticoagulation - will start Eliquis per pharmacy -We will discontinue aspirin as it was started many years ago by patient's PCP  Fall -We will obtain PT evaluation  Hyperlipidemia -Continue  rosuvastatin  Prediabetes -Farxiga on hold -Continue sliding scale insulin NovoLog  Scheduled medications:    aspirin EC  81 mg Oral Daily   atenolol  100 mg Oral Daily   enoxaparin (LOVENOX) injection  40 mg Subcutaneous Q24H   insulin aspart  0-9 Units Subcutaneous TID WC   polyethylene glycol  17 g Oral Daily   rosuvastatin  40 mg Oral Daily         Data Reviewed:   CBG:  Recent Labs  Lab 11/04/20 2108 11/05/20 0804 11/05/20 1125 11/05/20 1629  GLUCAP 107* 105* 108* 114*    SpO2: 98 %    Vitals:   11/04/20 2023 11/05/20 0020 11/05/20 0429 11/05/20 1451  BP: (!) 144/87 120/66 137/83 (!) 143/82  Pulse: 76 69 68 97  Resp: 20 20 20 16   Temp: 97.9 F (36.6 C) 98.7 F (37.1 C) 98.6 F (37 C) 98.3 F (36.8 C)  TempSrc: Oral Oral Oral Oral  SpO2: 100% 96% 97% 98%  Weight:      Height:         Intake/Output Summary (Last 24 hours) at 11/05/2020 1720 Last data filed at 11/05/2020 1710 Gross per 24 hour  Intake 2417.62 ml  Output 530 ml  Net 1887.62 ml    08/31 1901 - 09/02 0700 In: -  Out: 530 [Urine:530]  Filed Weights   11/04/20 1630  Weight: 95.1 kg    CBC:  Recent Labs  Lab 11/04/20 1317 11/05/20 0532  WBC 9.4 8.1  HGB 15.0 13.1  HCT 43.2 37.8  PLT 269 239  MCV 97.5 97.4  MCH 33.9 33.8  MCHC 34.7 34.7  RDW 12.8 12.6  Complete metabolic panel:  Recent Labs  Lab 11/04/20 1317 11/05/20 0532 11/05/20 0956  NA 134* 133*  --   K 4.7 2.5*  --   CL 92* 94*  --   CO2 29 26  --   GLUCOSE 102* 92  --   BUN 22 24*  --   CREATININE 1.40* 1.34*  --   CALCIUM 10.1 9.4  --   AST 48* 22  --   ALT 26 17  --   ALKPHOS 66 54  --   BILITOT 2.0* 0.9  --   ALBUMIN 4.2 3.6  --   MG  --   --  1.8  TSH  --  2.487  --   HGBA1C  --  5.7*  --     No results for input(s): LIPASE, AMYLASE in the last 168 hours.  Recent Labs  Lab 11/04/20 1558  SARSCOV2NAA NEGATIVE     ------------------------------------------------------------------------------------------------------------------ No results for input(s): CHOL, HDL, LDLCALC, TRIG, CHOLHDL, LDLDIRECT in the last 72 hours.  Lab Results  Component Value Date   HGBA1C 5.7 (H) 11/05/2020   ------------------------------------------------------------------------------------------------------------------ Recent Labs    11/05/20 0532  TSH 2.487   ------------------------------------------------------------------------------------------------------------------ No results for input(s): VITAMINB12, FOLATE, FERRITIN, TIBC, IRON, RETICCTPCT in the last 72 hours.  Coagulation profile No results for input(s): INR, PROTIME in the last 168 hours. No results for input(s): DDIMER in the last 72 hours.  Cardiac Enzymes No results for input(s): CKTOTAL, CKMB, CKMBINDEX, TROPONINI in the last 168 hours.  ------------------------------------------------------------------------------------------------------------------ No results found for: BNP   Antibiotics: Anti-infectives (From admission, onward)    None        Radiology Reports  DG Ribs Unilateral W/Chest Left  Result Date: 11/04/2020 CLINICAL DATA:  Left rib pain after fall EXAM: LEFT RIBS AND CHEST - 3+ VIEW COMPARISON:  01/19/2014 FINDINGS: No fracture or other bone lesions are seen involving the ribs. There is no evidence of pneumothorax or pleural effusion. Both lungs are clear. Heart size and mediastinal contours are within normal limits. IMPRESSION: Negative. Electronically Signed   By: Duanne Guess D.O.   On: 11/04/2020 14:58   DG Thoracic Spine 2 View  Result Date: 11/04/2020 CLINICAL DATA:  Low back pain after fall 1 week ago. EXAM: THORACIC SPINE 2 VIEWS COMPARISON:  None. FINDINGS: There is no evidence of thoracic spine fracture. Alignment is normal. No other significant bone abnormalities are identified. IMPRESSION: No acute abnormality is  noted. Electronically Signed   By: Lupita Raider M.D.   On: 11/04/2020 14:59   ECHOCARDIOGRAM COMPLETE  Result Date: 11/05/2020    ECHOCARDIOGRAM REPORT   Patient Name:   Deanna Gomez Date of Exam: 11/05/2020 Medical Rec #:  [id number]           Height:       65.5 in Accession #:    [id number]          Weight:       209.7 lb Date of Birth:  April 17, 1945           BSA:          2.030 m Patient Age:    75 years            BP:           136/70 mmHg Patient Gender: F                   HR:  74 bpm. Exam Location:  Inpatient Procedure: 2D Echo, Cardiac Doppler and Color Doppler Indications:    R55 Syncope  History:        Patient has prior history of Echocardiogram examinations, most                 recent 11/28/2019. Signs/Symptoms:Syncope; Risk                 Factors:Hypertension.  Sonographer:    MM Referring Phys: [id number] JEHANZEB MEMON IMPRESSIONS  1. Left ventricular ejection fraction, by estimation, is 60%. The left ventricle has normal function. The left ventricle has no regional wall motion abnormalities. There is severe concentric left ventricular hypertrophy. Left ventricular diastolic parameters are consistent with Grade I diastolic dysfunction (impaired relaxation).  2. Right ventricular systolic function is normal. The right ventricular size is normal. There is normal pulmonary artery systolic pressure.  3. The mitral valve is grossly normal. No evidence of mitral valve regurgitation. No evidence of mitral stenosis.  4. The aortic valve is tricuspid. There is moderate calcification of the aortic valve. There is mild thickening of the aortic valve. Aortic valve regurgitation is not visualized.  5. Aaortic valve sclerosis is present, with no evidence of aortic valve stenosis.  6. Aortic dilatation noted. There is mild dilatation of the ascending aorta, measuring 42 mm.  7. The inferior vena cava is normal in size with greater than 50% respiratory variability, suggesting right atrial pressure of 3  mmHg. Comparison(s): A prior study was performed on 11/28/2019. Slight increase in ascending aortic size. FINDINGS  Left Ventricle: Left ventricular ejection fraction, by estimation, is 60%. The left ventricle has normal function. The left ventricle has no regional wall motion abnormalities. The left ventricular internal cavity size was small. There is severe concentric left ventricular hypertrophy. Left ventricular diastolic parameters are consistent with Grade I diastolic dysfunction (impaired relaxation). Right Ventricle: The right ventricular size is normal. No increase in right ventricular wall thickness. Right ventricular systolic function is normal. There is normal pulmonary artery systolic pressure. The tricuspid regurgitant velocity is 1.26 m/s, and  with an assumed right atrial pressure of 3 mmHg, the estimated right ventricular systolic pressure is 9.4 mmHg. Left Atrium: Left atrial size was normal in size. Right Atrium: Right atrial size was normal in size. Pericardium: There is no evidence of pericardial effusion. Mitral Valve: The mitral valve is grossly normal. Mild to moderate mitral annular calcification. No evidence of mitral valve regurgitation. No evidence of mitral valve stenosis. Tricuspid Valve: The tricuspid valve is grossly normal. Tricuspid valve regurgitation is not demonstrated. Aortic Valve: The aortic valve is tricuspid. There is moderate calcification of the aortic valve. There is mild thickening of the aortic valve. Aortic valve regurgitation is not visualized. Mild aortic valve sclerosis is present, with no evidence of aortic valve stenosis. Aortic valve mean gradient measures 5.0 mmHg. Aortic valve peak gradient measures 8.9 mmHg. Aortic valve area, by VTI measures 4.23 cm. Pulmonic Valve: The pulmonic valve was normal in structure. Pulmonic valve regurgitation is trivial. Aorta: Aortic dilatation noted. There is mild dilatation of the ascending aorta, measuring 42 mm. Venous: The  inferior vena cava is normal in size with greater than 50% respiratory variability, suggesting right atrial pressure of 3 mmHg. IAS/Shunts: The atrial septum is grossly normal.  LEFT VENTRICLE PLAX 2D LVIDd:         3.70 cm     Diastology LVIDs:         2.70 cm  LV e' medial:  7.29 cm/s LV PW:         1.50 cm     LV e' lateral: 9.46 cm/s LV IVS:        1.50 cm LVOT diam:     2.30 cm LV SV:         132 LV SV Index:   65 LVOT Area:     4.15 cm  LV Volumes (MOD) LV vol d, MOD A4C: 50.1 ml LV vol s, MOD A4C: 22.4 ml LV SV MOD A4C:     50.1 ml RIGHT VENTRICLE             IVC RV S prime:     12.90 cm/s  IVC diam: 1.30 cm TAPSE (M-mode): 2.8 cm LEFT ATRIUM             Index       RIGHT ATRIUM           Index LA diam:        2.70 cm 1.33 cm/m  RA Area:     11.30 cm LA Vol (A2C):   37.7 ml 18.57 ml/m RA Volume:   22.50 ml  11.08 ml/m LA Vol (A4C):   23.5 ml 11.58 ml/m LA Biplane Vol: 29.9 ml 14.73 ml/m  AORTIC VALVE AV Area (Vmax):    3.46 cm AV Area (Vmean):   3.75 cm AV Area (VTI):     4.23 cm AV Vmax:           149.00 cm/s AV Vmean:          105.000 cm/s AV VTI:            0.311 m AV Peak Grad:      8.9 mmHg AV Mean Grad:      5.0 mmHg LVOT Vmax:         124.00 cm/s LVOT Vmean:        94.700 cm/s LVOT VTI:          0.317 m LVOT/AV VTI ratio: 1.02  AORTA Ao Root diam: 3.10 cm Ao Asc diam:  4.20 cm MITRAL VALVE               TRICUSPID VALVE MV Area (PHT): 3.73 cm    TR Peak grad:   6.4 mmHg MV A velocity: 87.50 cm/s  TR Vmax:        126.00 cm/s                             SHUNTS                            Systemic VTI:  0.32 m                            Systemic Diam: 2.30 cm Riley Lam MD Electronically signed by Riley Lam MD Signature Date/Time: 11/05/2020/1:08:27 PM    Final       DVT prophylaxis: Lovenox  Code Status: Full code  Family Communication: No family at bedside   Consultants:   Procedures:     Objective    Physical Examination:   General-appears in no  acute distress Heart-S1-S2, regular, no murmur auscultated Lungs-clear to auscultation bilaterally, no wheezing or crackles auscultated Abdomen-soft, nontender, no organomegaly Extremities-no edema in the lower extremities Neuro-alert, oriented x3, no focal deficit noted  Status is: Inpatient  Dispo: The patient is from: Home              Anticipated d/c is to: Home              Anticipated d/c date is: 11/06/2020              Patient currently not stable for discharge  Barrier to discharge-replacement of electrolytes, severe hypokalemia  COVID-19 Labs  No results for input(s): DDIMER, FERRITIN, LDH, CRP in the last 72 hours.  Lab Results  Component Value Date   SARSCOV2NAA NEGATIVE 11/04/2020    Microbiology  Recent Results (from the past 240 hour(s))  Resp Panel by RT-PCR (Flu A&B, Covid) Nasopharyngeal Swab     Status: None   Collection Time: 11/04/20  3:58 PM   Specimen: Nasopharyngeal Swab; Nasopharyngeal(NP) swabs in vial transport medium  Result Value Ref Range Status   SARS Coronavirus 2 by RT PCR NEGATIVE NEGATIVE Final    Comment: (NOTE) SARS-CoV-2 target nucleic acids are NOT DETECTED.  The SARS-CoV-2 RNA is generally detectable in upper respiratory specimens during the acute phase of infection. The lowest concentration of SARS-CoV-2 viral copies this assay can detect is 138 copies/mL. A negative result does not preclude SARS-Cov-2 infection and should not be used as the sole basis for treatment or other patient management decisions. A negative result may occur with  improper specimen collection/handling, submission of specimen other than nasopharyngeal swab, presence of viral mutation(s) within the areas targeted by this assay, and inadequate number of viral copies(<138 copies/mL). A negative result must be combined with clinical observations, patient history, and epidemiological information. The expected result is Negative.  Fact Sheet for Patients:   BloggerCourse.com  Fact Sheet for Healthcare Providers:  SeriousBroker.it  This test is no t yet approved or cleared by the [place] FDA and  has been authorized for detection and/or diagnosis of SARS-CoV-2 by FDA under an Emergency Use Authorization (EUA). This EUA will remain  in effect (meaning this test can be used) for the duration of the COVID-19 declaration under Section 564(b)(1) of the Act, 21 U.S.C.section 360bbb-3(b)(1), unless the authorization is terminated  or revoked sooner.       Influenza A by PCR NEGATIVE NEGATIVE Final   Influenza B by PCR NEGATIVE NEGATIVE Final    Comment: (NOTE) The Xpert Xpress SARS-CoV-2/FLU/RSV plus assay is intended as an aid in the diagnosis of influenza from Nasopharyngeal swab specimens and should not be used as a sole basis for treatment. Nasal washings and aspirates are unacceptable for Xpert Xpress SARS-CoV-2/FLU/RSV testing.  Fact Sheet for Patients: BloggerCourse.com  Fact Sheet for Healthcare Providers: SeriousBroker.it  This test is not yet approved or cleared by the [place] FDA and has been authorized for detection and/or diagnosis of SARS-CoV-2 by FDA under an Emergency Use Authorization (EUA). This EUA will remain in effect (meaning this test can be used) for the duration of the COVID-19 declaration under Section 564(b)(1) of the Act, 21 U.S.C. section 360bbb-3(b)(1), unless the authorization is terminated or revoked.  Performed at [facility], [address], [place], [place] [address]              Meredeth Ide   [facility] If 7PM-7AM, please contact night-coverage at [email or web address], Office  [phone]   11/05/2020, 5:20 PM  LOS: 0 days

## 2020-11-06 DIAGNOSIS — W19XXXA Unspecified fall, initial encounter: Secondary | ICD-10-CM | POA: Diagnosis not present

## 2020-11-06 DIAGNOSIS — I480 Paroxysmal atrial fibrillation: Secondary | ICD-10-CM

## 2020-11-06 DIAGNOSIS — N179 Acute kidney failure, unspecified: Secondary | ICD-10-CM | POA: Diagnosis not present

## 2020-11-06 DIAGNOSIS — N1832 Chronic kidney disease, stage 3b: Secondary | ICD-10-CM | POA: Diagnosis not present

## 2020-11-06 DIAGNOSIS — I10 Essential (primary) hypertension: Secondary | ICD-10-CM | POA: Diagnosis not present

## 2020-11-06 LAB — GLUCOSE, CAPILLARY
Glucose-Capillary: 97 mg/dL (ref 70–99)
Glucose-Capillary: 95 mg/dL (ref 70–99)
Glucose-Capillary: 102 mg/dL — ABNORMAL HIGH (ref 70–99)
Glucose-Capillary: 99 mg/dL (ref 70–99)

## 2020-11-06 LAB — BASIC METABOLIC PANEL
Anion gap: 7 (ref 5–15)
Anion gap: 10 (ref 5–15)
BUN: 18 mg/dL (ref 8–23)
BUN: 23 mg/dL (ref 8–23)
CO2: 31 mmol/L (ref 22–32)
CO2: 25 mmol/L (ref 22–32)
Calcium: 9.6 mg/dL (ref 8.9–10.3)
Calcium: 9.6 mg/dL (ref 8.9–10.3)
Chloride: 101 mmol/L (ref 98–111)
Chloride: 104 mmol/L (ref 98–111)
Creatinine, Ser: 1.1 mg/dL — ABNORMAL HIGH (ref 0.44–1.00)
Creatinine, Ser: 1.02 mg/dL — ABNORMAL HIGH (ref 0.44–1.00)
GFR, Estimated: 52 mL/min — ABNORMAL LOW (ref >60)
GFR, Estimated: 57 mL/min — ABNORMAL LOW (ref >60)
Glucose, Bld: 102 mg/dL — ABNORMAL HIGH (ref 70–99)
Glucose, Bld: 96 mg/dL (ref 70–99)
Potassium: 2.8 mmol/L — ABNORMAL LOW (ref 3.5–5.1)
Potassium: 3.8 mmol/L (ref 3.5–5.1)
Sodium: 139 mmol/L (ref 135–145)
Sodium: 139 mmol/L (ref 135–145)

## 2020-11-06 LAB — POTASSIUM: Potassium: 4.1 mmol/L (ref 3.5–5.1)

## 2020-11-06 MED ORDER — POTASSIUM CHLORIDE CRYS ER 10 MEQ PO TBCR
40.0000 meq | EXTENDED_RELEASE_TABLET | ORAL | Status: AC
  Administered 2020-11-06 (×2): 40 meq via ORAL
  Filled 2020-11-06 (×2): qty 4

## 2020-11-06 MED ORDER — POTASSIUM CHLORIDE CRYS ER 10 MEQ PO TBCR
40.0000 meq | EXTENDED_RELEASE_TABLET | Freq: Once | ORAL | Status: AC
  Administered 2020-11-06: 40 meq via ORAL
  Filled 2020-11-06: qty 4

## 2020-11-06 NOTE — Progress Notes (Signed)
[facility]  PROGRESS NOTE  Deanna Gomez JOI:[id number] DOB: 30-Sep-1945 DOA: 11/04/2020 PCP: Deanna Pinto, MD   Brief HPI:   75 year old female with medical history of hypertension, chronic kidney disease stage II, hyperlipidemia, prediabetes who has been taking laxative for constipation.  Patient states that she had multiple bowel movements.  When standing up from commode she felt increasingly lightheaded and had a syncopal episode.  She fell in the bathroom.  She was eventually able to get up.  And has been feeling dizzy.  In the ED she was found to have positive orthostatic hypotension.    Subjective   Patient seen and examined, wants to go home.  Potassium was 2.8 this morning.   Assessment/Plan:    Syncope -Likely from multiple bowel movements, dehydration -Orthostatics on admission were abnormal -Echocardiogram obtained yesterday shows EF 15%, grade 1 diastolic dysfunction. -Continue normal saline at 100 mill per hour -Patient is on multiple and hypertensive medications, will continue to hold losartan, HCTZ at this time  Hypertension -Patient on atenolol, HCTZ, losartan at home. -I do not think she needs all these medications, blood pressure is fairly well controlled on atenolol -Continue to hold HCTZ and losartan. -We will monitor  Hypokalemia -Patient developed severe hypokalemia with potassium 2.5 yesterday -She received IV KCl 10 mg x 5 doses yesterday -This morning potassium is 2.8, K. Dur 40 mg p.o. x2 doses were given every 4 hours -Repeat potassium sample is hemolyzed, I discussed with patient that we will give additional dose of K-Dur 40 mEq p.o. x1 at 7 PM and recheck be met at 10 PM tonight.  Diabetes mellitus type 2 -Continue sliding scale insulin NovoLog -CBG well controlled  New onset atrial fibrillation -EKG shows atrial fibrillation - CHA2DS2VASc  score is 5. Will need anticoagulation - will start Eliquis per pharmacy -We will  discontinue aspirin as it was started many years ago by patient's PCP  Fall -PT evaluation was obtained -Rolling walker with 5 inch wheels recommended  Hyperlipidemia -Continue rosuvastatin  Prediabetes -Farxiga on hold -Continue sliding scale insulin NovoLog  Scheduled medications:    apixaban  5 mg Oral BID   atenolol  100 mg Oral Daily   insulin aspart  0-9 Units Subcutaneous TID WC   potassium chloride  40 mEq Oral Once   rosuvastatin  40 mg Oral Daily         Data Reviewed:   CBG:  Recent Labs  Lab 11/05/20 1629 11/05/20 2015 11/06/20 0758 11/06/20 1128 11/06/20 1554  GLUCAP 114* 124* 97 95 102*    SpO2: 98 %    Vitals:   11/05/20 1451 11/05/20 2012 11/06/20 0522 11/06/20 1350  BP: (!) 143/82 (!) 149/75 (!) 155/85 (!) 156/73  Pulse: 97 (!) 54 (!) 51 (!) 52  Resp: _0 Temp: 98.3 F (36.8 C) 98.6 F (37 C) 97.7 F (36.5 C) 98.4 F (36.9 C)  TempSrc: Oral Oral Oral Oral  SpO2: 98% 99% 99% 98%  Weight:      Height:         Intake/Output Summary (Last 24 hours) at 11/06/2020 1731 Last data filed at 11/06/2020 1300 Gross per 24 hour  Intake 720 ml  Output --  Net 720 ml    09/01 1901 - 09/03 0700 In: 2897.6 [P.O.:1662; I.V.:1114.9] Out: 530 [Urine:530]  Filed Weights   11/04/20 1630  Weight: 95.1 kg    CBC:  Recent Labs  Lab 11/04/20 1317 11/05/20 0532  WBC 9.4  8.1  HGB 15.0 13.1  HCT 43.2 37.8  PLT 269 239  MCV 97.5 97.4  MCH 33.9 33.8  MCHC 34.7 34.7  RDW 12.8 12.6    Complete metabolic panel:  Recent Labs  Lab 11/04/20 1317 11/05/20 0532 11/05/20 0956 11/06/20 0824  NA 134* 133*  --  139  K 4.7 2.5*  --  2.8*  CL 92* 94*  --  101  CO2 29 26  --  31  GLUCOSE 102* 92  --  102*  BUN 22 24*  --  18  CREATININE 1.40* 1.34*  --  1.10*  CALCIUM 10.1 9.4  --  9.6  AST 48* 22  --   --   ALT 26 17  --   --   ALKPHOS 66 54  --   --   BILITOT 2.0* 0.9  --   --   ALBUMIN 4.2 3.6  --   --   MG  --   --  1.8  --    TSH  --  2.487  --   --   HGBA1C  --  5.7*  --   --     No results for input(s): LIPASE, AMYLASE in the last 168 hours.  Recent Labs  Lab 11/04/20 1558  [place]    ------------------------------------------------------------------------------------------------------------------ No results for input(s): CHOL, HDL, LDLCALC, TRIG, CHOLHDL, LDLDIRECT in the last 72 hours.  Lab Results  Component Value Date   HGBA1C 5.7 (H) 11/05/2020   ------------------------------------------------------------------------------------------------------------------ Recent Labs    11/05/20 0532  TSH 2.487   ------------------------------------------------------------------------------------------------------------------ No results for input(s): VITAMINB12, FOLATE, FERRITIN, TIBC, IRON, RETICCTPCT in the last 72 hours.  Coagulation profile No results for input(s): INR, PROTIME in the last 168 hours. No results for input(s): DDIMER in the last 72 hours.  Cardiac Enzymes No results for input(s): CKTOTAL, CKMB, CKMBINDEX, TROPONINI in the last 168 hours.  ------------------------------------------------------------------------------------------------------------------ No results found for: BNP   Antibiotics: Anti-infectives (From admission, onward)    None        Radiology Reports  ECHOCARDIOGRAM COMPLETE  Result Date: 11/05/2020    ECHOCARDIOGRAM REPORT   Patient Name:   Deanna Gomez Date of Exam: 11/05/2020 Medical Rec #:  [id number]           Height:       65.5 in Accession #:    [id number]          Weight:       209.7 lb Date of Birth:  11/12/1945           BSA:          2.030 m Patient Age:    66 years            BP:           136/70 mmHg Patient Gender: F                   HR:           74 bpm. Exam Location:  Inpatient Procedure: 2D Echo, Cardiac Doppler and Color Doppler Indications:    R55 Syncope  History:        Patient has prior history of Echocardiogram  examinations, most                 recent 11/28/2019. Signs/Symptoms:Syncope; Risk                 Factors:Hypertension.  Sonographer:    MM Referring Phys: [id number]  JEHANZEB MEMON IMPRESSIONS  1. Left ventricular ejection fraction, by estimation, is 60%. The left ventricle has normal function. The left ventricle has no regional wall motion abnormalities. There is severe concentric left ventricular hypertrophy. Left ventricular diastolic parameters are consistent with Grade I diastolic dysfunction (impaired relaxation).  2. Right ventricular systolic function is normal. The right ventricular size is normal. There is normal pulmonary artery systolic pressure.  3. The mitral valve is grossly normal. No evidence of mitral valve regurgitation. No evidence of mitral stenosis.  4. The aortic valve is tricuspid. There is moderate calcification of the aortic valve. There is mild thickening of the aortic valve. Aortic valve regurgitation is not visualized.  5. Aaortic valve sclerosis is present, with no evidence of aortic valve stenosis.  6. Aortic dilatation noted. There is mild dilatation of the ascending aorta, measuring 42 mm.  7. The inferior vena cava is normal in size with greater than 50% respiratory variability, suggesting right atrial pressure of 3 mmHg. Comparison(s): A prior study was performed on 11/28/2019. Slight increase in ascending aortic size. FINDINGS  Left Ventricle: Left ventricular ejection fraction, by estimation, is 60%. The left ventricle has normal function. The left ventricle has no regional wall motion abnormalities. The left ventricular internal cavity size was small. There is severe concentric left ventricular hypertrophy. Left ventricular diastolic parameters are consistent with Grade I diastolic dysfunction (impaired relaxation). Right Ventricle: The right ventricular size is normal. No increase in right ventricular wall thickness. Right ventricular systolic function is normal. There is normal  pulmonary artery systolic pressure. The tricuspid regurgitant velocity is 1.26 m/s, and  with an assumed right atrial pressure of 3 mmHg, the estimated right ventricular systolic pressure is 9.4 mmHg. Left Atrium: Left atrial size was normal in size. Right Atrium: Right atrial size was normal in size. Pericardium: There is no evidence of pericardial effusion. Mitral Valve: The mitral valve is grossly normal. Mild to moderate mitral annular calcification. No evidence of mitral valve regurgitation. No evidence of mitral valve stenosis. Tricuspid Valve: The tricuspid valve is grossly normal. Tricuspid valve regurgitation is not demonstrated. Aortic Valve: The aortic valve is tricuspid. There is moderate calcification of the aortic valve. There is mild thickening of the aortic valve. Aortic valve regurgitation is not visualized. Mild aortic valve sclerosis is present, with no evidence of aortic valve stenosis. Aortic valve mean gradient measures 5.0 mmHg. Aortic valve peak gradient measures 8.9 mmHg. Aortic valve area, by VTI measures 4.23 cm. Pulmonic Valve: The pulmonic valve was normal in structure. Pulmonic valve regurgitation is trivial. Aorta: Aortic dilatation noted. There is mild dilatation of the ascending aorta, measuring 42 mm. Venous: The inferior vena cava is normal in size with greater than 50% respiratory variability, suggesting right atrial pressure of 3 mmHg. IAS/Shunts: The atrial septum is grossly normal.  LEFT VENTRICLE PLAX 2D LVIDd:         3.70 cm     Diastology LVIDs:         2.70 cm     LV e' medial:  7.29 cm/s LV PW:         1.50 cm     LV e' lateral: 9.46 cm/s LV IVS:        1.50 cm LVOT diam:     2.30 cm LV SV:         132 LV SV Index:   65 LVOT Area:     4.15 cm  LV Volumes (MOD) LV vol d, MOD A4C: 50.1  ml LV vol s, MOD A4C: 22.4 ml LV SV MOD A4C:     50.1 ml RIGHT VENTRICLE             IVC RV S prime:     12.90 cm/s  IVC diam: 1.30 cm TAPSE (M-mode): 2.8 cm LEFT ATRIUM             Index        RIGHT ATRIUM           Index LA diam:        2.70 cm 1.33 cm/m  RA Area:     11.30 cm LA Vol (A2C):   37.7 ml 18.57 ml/m RA Volume:   22.50 ml  11.08 ml/m LA Vol (A4C):   23.5 ml 11.58 ml/m LA Biplane Vol: 29.9 ml 14.73 ml/m  AORTIC VALVE AV Area (Vmax):    3.46 cm AV Area (Vmean):   3.75 cm AV Area (VTI):     4.23 cm AV Vmax:           149.00 cm/s AV Vmean:          105.000 cm/s AV VTI:            0.311 m AV Peak Grad:      8.9 mmHg AV Mean Grad:      5.0 mmHg LVOT Vmax:         124.00 cm/s LVOT Vmean:        94.700 cm/s LVOT VTI:          0.317 m LVOT/AV VTI ratio: 1.02  AORTA Ao Root diam: 3.10 cm Ao Asc diam:  4.20 cm MITRAL VALVE               TRICUSPID VALVE MV Area (PHT): 3.73 cm    TR Peak grad:   6.4 mmHg MV A velocity: 87.50 cm/s  TR Vmax:        126.00 cm/s                             SHUNTS                            Systemic VTI:  0.32 m                            Systemic Diam: 2.30 cm Rudean Haskell MD Electronically signed by Rudean Haskell MD Signature Date/Time: 11/05/2020/1:08:27 PM    Final       DVT prophylaxis: Lovenox  Code Status: Full code  Family Communication: No family at bedside   Consultants:   Procedures:     Objective    Physical Examination:   General-appears in no acute distress Heart-S1-S2, regular, no murmur auscultated Lungs-clear to auscultation bilaterally, no wheezing or crackles auscultated Abdomen-soft, nontender, no organomegaly Extremities-no edema in the lower extremities Neuro-alert, oriented x3, no focal deficit noted  Status is: Inpatient  Dispo: The patient is from: Home              Anticipated d/c is to: Home              Anticipated d/c date is: 11/07/2020              Patient currently not stable for discharge  Barrier to discharge-replacement of electrolytes, severe hypokalemia  COVID-19 Labs  No results for input(s):  DDIMER, FERRITIN, LDH, CRP in the last 72 hours.  Lab Results  Component Value  Date   [place] NEGATIVE 11/04/2020    Microbiology  Recent Results (from the past 240 hour(s))  Resp Panel by RT-PCR (Flu A&B, Covid) Nasopharyngeal Swab     Status: None   Collection Time: 11/04/20  3:58 PM   Specimen: Nasopharyngeal Swab; Nasopharyngeal(NP) swabs in vial transport medium  Result Value Ref Range Status   SARS Coronavirus 2 by RT PCR NEGATIVE NEGATIVE Final    Comment: (NOTE) SARS-CoV-2 target nucleic acids are NOT DETECTED.  The SARS-CoV-2 RNA is generally detectable in upper respiratory specimens during the acute phase of infection. The lowest concentration of SARS-CoV-2 viral copies this assay can detect is 138 copies/mL. A negative result does not preclude SARS-Cov-2 infection and should not be used as the sole basis for treatment or other patient management decisions. A negative result may occur with  improper specimen collection/handling, submission of specimen other than nasopharyngeal swab, presence of viral mutation(s) within the areas targeted by this assay, and inadequate number of viral copies(<138 copies/mL). A negative result must be combined with clinical observations, patient history, and epidemiological information. The expected result is Negative.  Fact Sheet for Patients:  EntrepreneurPulse.com.au  Fact Sheet for Healthcare Providers:  IncredibleEmployment.be  This test is no t yet approved or cleared by the [place] FDA and  has been authorized for detection and/or diagnosis of SARS-CoV-2 by FDA under an Emergency Use Authorization (EUA). This EUA will remain  in effect (meaning this test can be used) for the duration of the COVID-19 declaration under Section 564(b)(1) of the Act, 21 U.S.C.section 360bbb-3(b)(1), unless the authorization is terminated  or revoked sooner.       Influenza A by PCR NEGATIVE NEGATIVE Final   Influenza B by PCR NEGATIVE NEGATIVE Final    Comment: (NOTE) The  Xpert Xpress SARS-CoV-2/FLU/RSV plus assay is intended as an aid in the diagnosis of influenza from Nasopharyngeal swab specimens and should not be used as a sole basis for treatment. Nasal washings and aspirates are unacceptable for Xpert Xpress SARS-CoV-2/FLU/RSV testing.  Fact Sheet for Patients: EntrepreneurPulse.com.au  Fact Sheet for Healthcare Providers: IncredibleEmployment.be  This test is not yet approved or cleared by the [place] FDA and has been authorized for detection and/or diagnosis of SARS-CoV-2 by FDA under an Emergency Use Authorization (EUA). This EUA will remain in effect (meaning this test can be used) for the duration of the COVID-19 declaration under Section 564(b)(1) of the Act, 21 U.S.C. section 360bbb-3(b)(1), unless the authorization is terminated or revoked.  Performed at [facility], [place] [address], [place], [place] [address]              Oswald Hillock   [facility] If 7PM-7AM, please contact night-coverage at [email or web address], Office  [phone]   11/06/2020, 5:31 PM  LOS: 0 days

## 2020-11-06 NOTE — Consult Note (Signed)
Cardiology Consultation:   Patient ID: Deanna Gomez MRN: [id number]; DOB: May 23, 1945  Admit date: 11/04/2020 Date of Consult: 11/06/2020  PCP:  Lucky Cowboy, MD   [facility] Providers Cardiologist:  Verne Carrow, MD        Patient Profile:   Deanna Gomez is a 75 y.o. female with a hx of HTN, DM2, HLP, CKD 2 who is being seen 11/06/2020 for the evaluation of syncope and newly diagnosed atrial fibrillation  at the request of Dr. Sharl Ma.  History of Present Illness:   Deanna Gomez was admitted 11/04/2020 with syncope and fall, after marked increase in bowel movements due to laxative use. Mild worsening of renal parameters c/w hypovolemia, severe hypokalemia, atrial fibrillation with borderline ventricular rate present on arrival.  She was completely unaware of arrhythmia on arrival.  She is chronically on atenolol for hypertension.  Ventricular rate control was fair with a rate of 99 bpm.  She converted to sinus rhythm at approximately 1800 hrs. yesterday.  She has not really noticed a difference.  She has never had palpitations or irregular heartbeat at home that she is aware of.  She denies problems with shortness of breath or angina either at rest or with activity.  Denies orthopnea, PND, lower extremity edema, claudication or any focal neurological events.  She has not had bleeding problems or falls other than the one that led to the admission.  She was scheduled for an elective follow-up colonoscopy but canceled it due to the cost.  She is planning to move to [facility].  Not hypotensive here (although dizzy at home). BP is running high, 150s/100 range.  Echo shows normal LVEF, "severe" LVH (mild-moderate by measurement) and normal atrial sizes. Previous echo 2021 with evidence of mild diastolic dysfunction (and LA mildly dilated per measurement). Multiple studies over the years show stable mild AI, mildly dilated aortic root. Aortic atherosclerosis noted  incidentally on abdominal CT 2018.  Normal ECG stress test 2013.  Remote smoker (30 pack years). On statin, LDL is 111, but HDL outstanding at 102. Excellent A1c 5.7% on Farxiga monotherapy. Normal TSH this admission.   Past Medical History:  Diagnosis Date   Abdominal aortic atherosclerosis (HCC) 12/26/2016   Noted incidentally on 12/24/2016 CT   Gastric ulcer 11/19/2014   Hepatic steatosis 12/26/2016   Noted on CT 12/24/2016 - moderate degree without distinct lesions   Hepatitis C    HTN (hypertension)    Hyperlipidemia    Obesity    Pre-diabetes    Scoliosis    Tobacco use    stopped 6 years ago.   Vitamin D deficiency     Past Surgical History:  Procedure Laterality Date   BACK SURGERY  1986   x 3   BREAST RECONSTRUCTION     x 2   CATARACT EXTRACTION     Bilateral     Home Medications:  Prior to Admission medications   Medication Sig Start Date End Date Taking? Authorizing Provider  ALPRAZolam (XANAX) 0.5 MG tablet Take    1/2 - 1 tablet    1 to 2  x /day    ONLY    if needed for Anxiety Attack or sleep  &  limit to 5 days /week to avoid Addiction & Dementia Patient taking differently: Take 0.25-0.5 mg by mouth daily as needed for sleep or anxiety. Limit to 5 days /week to avoid Addiction & Dementia 09/17/20  Yes Revonda Humphrey, NP  aspirin EC 81 MG tablet  Take 81 mg by mouth daily.   Yes [provider]  atenolol (TENORMIN) 100 MG tablet Take 100 mg by mouth daily.   Yes [provider]  Cholecalciferol (VITAMIN D3) 50 MCG (2000 UT) capsule Take 2,000 Units by mouth daily.   Yes [provider]  dapagliflozin propanediol (FARXIGA) 10 MG TABS tablet Take 1 tablet by mouth daily. For kidney protection.   Yes [provider]  hydrochlorothiazide (HYDRODIURIL) 25 MG tablet Take  1 tablet  Daily  for BP & Fluid Retention /Ankle Swelling Patient taking differently: Take 25 mg by mouth daily. Take  1 tablet  Daily  for BP & Fluid Retention  /Ankle Swelling 05/14/20  Yes Lucky Cowboy, MD  losartan (COZAAR) 100 MG tablet TAKE ONE TABLET BY MOUTH DAILY Patient taking differently: Take 100 mg by mouth daily. 10/08/19  Yes Jodelle Gross, NP  nystatin (NYSTATIN) powder Apply daily after a shower as needed Patient taking differently: Apply 1 application topically daily as needed (rash). Apply daily after a shower as needed 05/03/20  Yes Corbett, Morrie Sheldon, NP  Polyethyl Glycol-Propyl Glycol (SYSTANE) 0.4-0.3 % SOLN Apply 1 drop to eye 3 (three) times daily as needed (dry eyes).   Yes [provider]  rosuvastatin (CRESTOR) 40 MG tablet Take  1 tablet  Daily  for Cholesterol 08/28/20  Yes Lucky Cowboy, MD    Inpatient Medications: Scheduled Meds:  apixaban  5 mg Oral BID   atenolol  100 mg Oral Daily   insulin aspart  0-9 Units Subcutaneous TID WC   polyethylene glycol  17 g Oral Daily   rosuvastatin  40 mg Oral Daily   Continuous Infusions:  sodium chloride 100 mL/hr at 11/05/20 1347   PRN Meds: acetaminophen **OR** acetaminophen, ALPRAZolam, hydrALAZINE, ondansetron **OR** ondansetron (ZOFRAN) IV, senna  Allergies:    Allergies  Allergen Reactions   Fentanyl Other (See Comments)    Intolerlance    Social History:   Social History   Socioeconomic History   Marital status: Married    Spouse name: Not on file   Number of children: 1   Years of education: Not on file   Highest education level: Not on file  Occupational History   Occupation: Retired Teacher, early years/pre  Tobacco Use   Smoking status: Former    Packs/day: 1.00    Years: 30.00    Pack years: 30.00    Types: Cigarettes    Quit date: 08/28/2004    Years since quitting: 16.2   Smokeless tobacco: Never  Vaping Use   Vaping Use: Never used  Substance and Sexual Activity   Alcohol use: Yes    Alcohol/week: 14.0 standard drinks    Types: 14 Standard drinks or equivalent per week    Comment: Drinks 2-3 glasses wine nightly x 20 years    Drug use: No   Sexual activity: Not Currently    Birth control/protection: Post-menopausal  Other Topics Concern   Not on file  Social History Narrative   Lives with husband in a two story home.  Has 1 child and 4 stepchildren. Retired Teacher, early years/pre.  Education: Masters.     Social Determinants of Health   Financial Resource Strain: Not on file  Food Insecurity: Not on file  Transportation Needs: Not on file  Physical Activity: Not on file  Stress: Not on file  Social Connections: Not on file  Intimate Partner Violence: Not on file    Family History:    Family  History  Problem Relation Age of Onset   Hyperlipidemia Father    Coronary artery disease Brother    Obesity Brother      ROS:  Please see the history of present illness.   All other ROS reviewed and negative.     Physical Exam/Data:   Vitals:   11/05/20 0429 11/05/20 1451 11/05/20 2012 11/06/20 0522  BP: 137/83 (!) 143/82 (!) 149/75 (!) 155/85  Pulse: 68 97 (!) 54 (!) 51  Resp: Temp: 98.6 F (37 C) 98.3 F (36.8 C) 98.6 F (37 C) 97.7 F (36.5 C)  TempSrc: Oral Oral Oral Oral  SpO2: 97% 98% 99% 99%  Weight:      Height:        Intake/Output Summary (Last 24 hours) at 11/06/2020 0841 Last data filed at 11/05/2020 1710 Gross per 24 hour  Intake 2181.62 ml  Output --  Net 2181.62 ml   Last 3 Weights 11/04/2020 09/16/2020 07/22/2020  Weight (lbs) 209 lb 11.2 oz 214 lb 12.8 oz 212 lb  Weight (kg) 95.12 kg 97.433 kg 96.163 kg     Body mass index is 34.37 kg/m.  General:  Well nourished, well developed, in no acute distress, mildly obese HEENT: normal Lymph: no adenopathy Neck: no JVD Endocrine:  No thryomegaly Vascular: No carotid bruits; FA pulses 2+ bilaterally without bruits  Cardiac:  normal S1, S2; RRR; no murmur  Lungs:  clear to auscultation bilaterally, no wheezing, rhonchi or rales  Abd: soft, nontender, no hepatomegaly  Ext: no edema Musculoskeletal:  No deformities, BUE  and BLE strength normal and equal Skin: warm and dry  Neuro:  CNs 2-12 intact, no focal abnormalities noted Psych:  Normal affect   EKG:  The EKG was personally reviewed and demonstrates:  atrial fibrillation w ventr rate 99 Telemetry:  Telemetry was personally reviewed and demonstrates: Atrial fibrillation with controlled rate terminated at 1800 hrs. on 11/05/2020.  Has been in sinus rhythm since then.  Relevant CV Studies: Echocardiogram 11/05/2020    1. Left ventricular ejection fraction, by estimation, is 60%. The left  ventricle has normal function. The left ventricle has no regional wall  motion abnormalities. There is severe concentric left ventricular  hypertrophy. Left ventricular diastolic  parameters are consistent with Grade I diastolic dysfunction (impaired  relaxation).   2. Right ventricular systolic function is normal. The right ventricular  size is normal. There is normal pulmonary artery systolic pressure.   3. The mitral valve is grossly normal. No evidence of mitral valve  regurgitation. No evidence of mitral stenosis.   4. The aortic valve is tricuspid. There is moderate calcification of the  aortic valve. There is mild thickening of the aortic valve. Aortic valve  regurgitation is not visualized.   5. Aaortic valve sclerosis is present, with no evidence of aortic valve  stenosis.   6. Aortic dilatation noted. There is mild dilatation of the ascending  aorta, measuring 42 mm.   7. The inferior vena cava is normal in size with greater than 50%  respiratory variability, suggesting right atrial pressure of 3 mmHg.   Comparison(s): A prior study was performed on 11/28/2019. Slight increase  in ascending aortic size.   Laboratory Data:  High Sensitivity Troponin:   Recent Labs  Lab 11/04/20 1317 11/04/20 1513  TROPONINIHS 6 5     Chemistry Recent Labs  Lab 11/04/20 1317 11/05/20 0532  NA 134* 133*  K 4.7 2.5*  CL 92* 94*  CO2 29 26  GLUCOSE 102* 92   BUN 22 24*  CREATININE 1.40* 1.34*  CALCIUM 10.1 9.4  GFRNONAA 39* 41*  ANIONGAP 13 13    Recent Labs  Lab 11/04/20 1317 11/05/20 0532  PROT 8.0 6.6  ALBUMIN 4.2 3.6  AST 48* 22  ALT 26 17  ALKPHOS 66 54  BILITOT 2.0* 0.9   Hematology Recent Labs  Lab 11/04/20 1317 11/05/20 0532  WBC 9.4 8.1  RBC 4.43 3.88  HGB 15.0 13.1  HCT 43.2 37.8  MCV 97.5 97.4  MCH 33.9 33.8  MCHC 34.7 34.7  RDW 12.8 12.6  PLT 269 239   BNPNo results for input(s): BNP, PROBNP in the last 168 hours.  DDimer No results for input(s): DDIMER in the last 168 hours.   Radiology/Studies:  DG Ribs Unilateral W/Chest Left  Result Date: 11/04/2020 CLINICAL DATA:  Left rib pain after fall EXAM: LEFT RIBS AND CHEST - 3+ VIEW COMPARISON:  01/19/2014 FINDINGS: No fracture or other bone lesions are seen involving the ribs. There is no evidence of pneumothorax or pleural effusion. Both lungs are clear. Heart size and mediastinal contours are within normal limits. IMPRESSION: Negative. Electronically Signed   By: Duanne Guess D.O.   On: 11/04/2020 14:58   DG Thoracic Spine 2 View  Result Date: 11/04/2020 CLINICAL DATA:  Low back pain after fall 1 week ago. EXAM: THORACIC SPINE 2 VIEWS COMPARISON:  None. FINDINGS: There is no evidence of thoracic spine fracture. Alignment is normal. No other significant bone abnormalities are identified. IMPRESSION: No acute abnormality is noted. Electronically Signed   By: Lupita Raider M.D.   On: 11/04/2020 14:59   ECHOCARDIOGRAM COMPLETE  Result Date: 11/05/2020    ECHOCARDIOGRAM REPORT   Patient Name:   Deanna Gomez Date of Exam: 11/05/2020 Medical Rec #:  [id number]           Height:       65.5 in Accession #:    [id number]          Weight:       209.7 lb Date of Birth:  [birth year]           BSA:          2.030 m Patient Age:    75 years            BP:           136/70 mmHg Patient Gender: F                   HR:           74 bpm. Exam Location:  Inpatient Procedure:  2D Echo, Cardiac Doppler and Color Doppler Indications:    R55 Syncope  History:        Patient has prior history of Echocardiogram examinations, most                 recent 11/28/2019. Signs/Symptoms:Syncope; Risk                 Factors:Hypertension.  Sonographer:    MM Referring Phys: [id number] JEHANZEB MEMON IMPRESSIONS  1. Left ventricular ejection fraction, by estimation, is 60%. The left ventricle has normal function. The left ventricle has no regional wall motion abnormalities. There is severe concentric left ventricular hypertrophy. Left ventricular diastolic parameters are consistent with Grade I diastolic dysfunction (impaired relaxation).  2. Right ventricular systolic function is normal. The right ventricular size is normal. There is normal  pulmonary artery systolic pressure.  3. The mitral valve is grossly normal. No evidence of mitral valve regurgitation. No evidence of mitral stenosis.  4. The aortic valve is tricuspid. There is moderate calcification of the aortic valve. There is mild thickening of the aortic valve. Aortic valve regurgitation is not visualized.  5. Aaortic valve sclerosis is present, with no evidence of aortic valve stenosis.  6. Aortic dilatation noted. There is mild dilatation of the ascending aorta, measuring 42 mm.  7. The inferior vena cava is normal in size with greater than 50% respiratory variability, suggesting right atrial pressure of 3 mmHg. Comparison(s): A prior study was performed on 11/28/2019. Slight increase in ascending aortic size. FINDINGS  Left Ventricle: Left ventricular ejection fraction, by estimation, is 60%. The left ventricle has normal function. The left ventricle has no regional wall motion abnormalities. The left ventricular internal cavity size was small. There is severe concentric left ventricular hypertrophy. Left ventricular diastolic parameters are consistent with Grade I diastolic dysfunction (impaired relaxation). Right Ventricle: The right ventricular  size is normal. No increase in right ventricular wall thickness. Right ventricular systolic function is normal. There is normal pulmonary artery systolic pressure. The tricuspid regurgitant velocity is 1.26 m/s, and  with an assumed right atrial pressure of 3 mmHg, the estimated right ventricular systolic pressure is 9.4 mmHg. Left Atrium: Left atrial size was normal in size. Right Atrium: Right atrial size was normal in size. Pericardium: There is no evidence of pericardial effusion. Mitral Valve: The mitral valve is grossly normal. Mild to moderate mitral annular calcification. No evidence of mitral valve regurgitation. No evidence of mitral valve stenosis. Tricuspid Valve: The tricuspid valve is grossly normal. Tricuspid valve regurgitation is not demonstrated. Aortic Valve: The aortic valve is tricuspid. There is moderate calcification of the aortic valve. There is mild thickening of the aortic valve. Aortic valve regurgitation is not visualized. Mild aortic valve sclerosis is present, with no evidence of aortic valve stenosis. Aortic valve mean gradient measures 5.0 mmHg. Aortic valve peak gradient measures 8.9 mmHg. Aortic valve area, by VTI measures 4.23 cm. Pulmonic Valve: The pulmonic valve was normal in structure. Pulmonic valve regurgitation is trivial. Aorta: Aortic dilatation noted. There is mild dilatation of the ascending aorta, measuring 42 mm. Venous: The inferior vena cava is normal in size with greater than 50% respiratory variability, suggesting right atrial pressure of 3 mmHg. IAS/Shunts: The atrial septum is grossly normal.  LEFT VENTRICLE PLAX 2D LVIDd:         3.70 cm     Diastology LVIDs:         2.70 cm     LV e' medial:  7.29 cm/s LV PW:         1.50 cm     LV e' lateral: 9.46 cm/s LV IVS:        1.50 cm LVOT diam:     2.30 cm LV SV:         132 LV SV Index:   65 LVOT Area:     4.15 cm  LV Volumes (MOD) LV vol d, MOD A4C: 50.1 ml LV vol s, MOD A4C: 22.4 ml LV SV MOD A4C:     50.1 ml  RIGHT VENTRICLE             IVC RV S prime:     12.90 cm/s  IVC diam: 1.30 cm TAPSE (M-mode): 2.8 cm LEFT ATRIUM  Index       RIGHT ATRIUM           Index LA diam:        2.70 cm 1.33 cm/m  RA Area:     11.30 cm LA Vol (A2C):   37.7 ml 18.57 ml/m RA Volume:   22.50 ml  11.08 ml/m LA Vol (A4C):   23.5 ml 11.58 ml/m LA Biplane Vol: 29.9 ml 14.73 ml/m  AORTIC VALVE AV Area (Vmax):    3.46 cm AV Area (Vmean):   3.75 cm AV Area (VTI):     4.23 cm AV Vmax:           149.00 cm/s AV Vmean:          105.000 cm/s AV VTI:            0.311 m AV Peak Grad:      8.9 mmHg AV Mean Grad:      5.0 mmHg LVOT Vmax:         124.00 cm/s LVOT Vmean:        94.700 cm/s LVOT VTI:          0.317 m LVOT/AV VTI ratio: 1.02  AORTA Ao Root diam: 3.10 cm Ao Asc diam:  4.20 cm MITRAL VALVE               TRICUSPID VALVE MV Area (PHT): 3.73 cm    TR Peak grad:   6.4 mmHg MV A velocity: 87.50 cm/s  TR Vmax:        126.00 cm/s                             SHUNTS                            Systemic VTI:  0.32 m                            Systemic Diam: 2.30 cm Riley Lam MD Electronically signed by Riley Lam MD Signature Date/Time: 11/05/2020/1:08:27 PM    Final      Assessment and Plan:   AFib: in setting of major electrolyte abnormality. On Apixaban, CHADSVasc 6 (age 83, gender, DM, HTN, aortic atherosclerosis = PAD).  Arrhythmia spontaneously resolved. Mild LVH and at most mild LA dilation. Good chance she will remain in NSR term, but also high embolic risk so would recommend continued treatment with Eliquis unless we have clear evidence of arrhythmia absence over a long period of time. HTN:  resume home meds except HCTZ until K corrected. DM: excellent control.  HLP: LDL high despite max dose rosuvastatin. Question compliance (LDL was better in 2021). Zetia could be added. Target LDL<70 preferably, but may not get there even w combination Rx. HDL is great. AI: does not appear to be hemodynamically  important.  [facility] will sign off.   Medication Recommendations: Resume home medications other than aspirin. Hold HCTZ (depending on labs today).   - Stop Aspirin - Start Eliquis 5 mg twice daily.   - Do not resume hydrochlorothiazide until hypokalemia has resolved.   - recommend a daily dose of potassium chloride 20 mEq to avoid recurrent problems with hypokalemia. Other recommendations (labs, testing, etc): Repeat basic metabolic panel in a couple of weeks Follow up as an outpatient: We will make arrangements for follow-up with Dr. Clifton James  in 1-2 months.    Risk Assessment/Risk Scores:          CHA2DS2-VASc Score = 6   This indicates a 9.7% annual risk of stroke. The patient's score is based upon: CHF History: No HTN History: Yes Diabetes History: Yes Stroke History: No Vascular Disease History: Yes Age Score: 2 Gender Score: 1        For questions or updates, please contact [facility] Please consult [email or web address] for contact info under    Signed, Thurmon Fair, MD  11/06/2020 8:41 AM

## 2020-11-07 DIAGNOSIS — Z20822 Contact with and (suspected) exposure to covid-19: Secondary | ICD-10-CM | POA: Diagnosis present

## 2020-11-07 DIAGNOSIS — E559 Vitamin D deficiency, unspecified: Secondary | ICD-10-CM | POA: Diagnosis present

## 2020-11-07 DIAGNOSIS — Z87891 Personal history of nicotine dependence: Secondary | ICD-10-CM | POA: Diagnosis not present

## 2020-11-07 DIAGNOSIS — Z8711 Personal history of peptic ulcer disease: Secondary | ICD-10-CM | POA: Diagnosis not present

## 2020-11-07 DIAGNOSIS — Z79899 Other long term (current) drug therapy: Secondary | ICD-10-CM | POA: Diagnosis not present

## 2020-11-07 DIAGNOSIS — I951 Orthostatic hypotension: Secondary | ICD-10-CM | POA: Diagnosis present

## 2020-11-07 DIAGNOSIS — E782 Mixed hyperlipidemia: Secondary | ICD-10-CM | POA: Diagnosis present

## 2020-11-07 DIAGNOSIS — I700 Atherosclerosis of aorta: Secondary | ICD-10-CM | POA: Diagnosis present

## 2020-11-07 DIAGNOSIS — Z7982 Long term (current) use of aspirin: Secondary | ICD-10-CM | POA: Diagnosis not present

## 2020-11-07 DIAGNOSIS — K5900 Constipation, unspecified: Secondary | ICD-10-CM | POA: Diagnosis present

## 2020-11-07 DIAGNOSIS — E860 Dehydration: Secondary | ICD-10-CM | POA: Diagnosis present

## 2020-11-07 DIAGNOSIS — W19XXXA Unspecified fall, initial encounter: Secondary | ICD-10-CM | POA: Diagnosis present

## 2020-11-07 DIAGNOSIS — I4891 Unspecified atrial fibrillation: Secondary | ICD-10-CM | POA: Diagnosis present

## 2020-11-07 DIAGNOSIS — N179 Acute kidney failure, unspecified: Secondary | ICD-10-CM | POA: Diagnosis present

## 2020-11-07 DIAGNOSIS — I129 Hypertensive chronic kidney disease with stage 1 through stage 4 chronic kidney disease, or unspecified chronic kidney disease: Secondary | ICD-10-CM | POA: Diagnosis present

## 2020-11-07 DIAGNOSIS — Z888 Allergy status to other drugs, medicaments and biological substances status: Secondary | ICD-10-CM | POA: Diagnosis not present

## 2020-11-07 DIAGNOSIS — I77810 Thoracic aortic ectasia: Secondary | ICD-10-CM | POA: Diagnosis present

## 2020-11-07 DIAGNOSIS — E669 Obesity, unspecified: Secondary | ICD-10-CM | POA: Diagnosis present

## 2020-11-07 DIAGNOSIS — N1832 Chronic kidney disease, stage 3b: Secondary | ICD-10-CM | POA: Diagnosis present

## 2020-11-07 DIAGNOSIS — Z83438 Family history of other disorder of lipoprotein metabolism and other lipidemia: Secondary | ICD-10-CM | POA: Diagnosis not present

## 2020-11-07 DIAGNOSIS — Z8249 Family history of ischemic heart disease and other diseases of the circulatory system: Secondary | ICD-10-CM | POA: Diagnosis not present

## 2020-11-07 DIAGNOSIS — Y92009 Unspecified place in unspecified non-institutional (private) residence as the place of occurrence of the external cause: Secondary | ICD-10-CM | POA: Diagnosis not present

## 2020-11-07 DIAGNOSIS — E1122 Type 2 diabetes mellitus with diabetic chronic kidney disease: Secondary | ICD-10-CM | POA: Diagnosis present

## 2020-11-07 DIAGNOSIS — R55 Syncope and collapse: Secondary | ICD-10-CM | POA: Diagnosis present

## 2020-11-07 DIAGNOSIS — B1920 Unspecified viral hepatitis C without hepatic coma: Secondary | ICD-10-CM | POA: Diagnosis present

## 2020-11-07 DIAGNOSIS — I4892 Unspecified atrial flutter: Secondary | ICD-10-CM | POA: Diagnosis present

## 2020-11-07 DIAGNOSIS — K760 Fatty (change of) liver, not elsewhere classified: Secondary | ICD-10-CM | POA: Diagnosis present

## 2020-11-07 LAB — GLUCOSE, CAPILLARY
Glucose-Capillary: 78 mg/dL (ref 70–99)
Glucose-Capillary: 106 mg/dL — ABNORMAL HIGH (ref 70–99)

## 2020-11-07 LAB — COMPREHENSIVE METABOLIC PANEL
ALT: 15 U/L (ref 0–44)
AST: 19 U/L (ref 15–41)
Albumin: 3.5 g/dL (ref 3.5–5.0)
Alkaline Phosphatase: 56 U/L (ref 38–126)
Anion gap: 8 (ref 5–15)
BUN: 19 mg/dL (ref 8–23)
CO2: 28 mmol/L (ref 22–32)
Calcium: 9.5 mg/dL (ref 8.9–10.3)
Chloride: 101 mmol/L (ref 98–111)
Creatinine, Ser: 1.04 mg/dL — ABNORMAL HIGH (ref 0.44–1.00)
GFR, Estimated: 55 mL/min — ABNORMAL LOW (ref >60)
Glucose, Bld: 91 mg/dL (ref 70–99)
Potassium: 3.3 mmol/L — ABNORMAL LOW (ref 3.5–5.1)
Sodium: 137 mmol/L (ref 135–145)
Total Bilirubin: 0.6 mg/dL (ref 0.3–1.2)
Total Protein: 6.5 g/dL (ref 6.5–8.1)

## 2020-11-07 MED ORDER — HYDROCHLOROTHIAZIDE 12.5 MG PO CAPS: 12.5000 mg | ORAL_CAPSULE | Freq: Every day | ORAL | 11 refills | Status: DC

## 2020-11-07 MED ORDER — LOSARTAN POTASSIUM 50 MG PO TABS
50.0000 mg | ORAL_TABLET | Freq: Every day | ORAL | Status: DC
  Administered 2020-11-07: 50 mg via ORAL
  Filled 2020-11-07: qty 1

## 2020-11-07 MED ORDER — HYDROCHLOROTHIAZIDE 12.5 MG PO CAPS
12.5000 mg | ORAL_CAPSULE | Freq: Every day | ORAL | Status: DC
  Administered 2020-11-07: 12.5 mg via ORAL
  Filled 2020-11-07: qty 1

## 2020-11-07 MED ORDER — APIXABAN 5 MG PO TABS: 5.0000 mg | ORAL_TABLET | Freq: Two times a day (BID) | ORAL | 3 refills | Status: DC

## 2020-11-07 MED ORDER — LOSARTAN POTASSIUM 50 MG PO TABS: 50.0000 mg | ORAL_TABLET | Freq: Every day | ORAL | 3 refills | Status: DC

## 2020-11-07 MED ORDER — POTASSIUM CHLORIDE CRYS ER 10 MEQ PO TBCR
40.0000 meq | EXTENDED_RELEASE_TABLET | Freq: Once | ORAL | Status: AC
  Administered 2020-11-07: 40 meq via ORAL
  Filled 2020-11-07: qty 4

## 2020-11-07 MED ORDER — SENNA 8.6 MG PO TABS: 1.0000 | ORAL_TABLET | Freq: Every day | ORAL | 0 refills | Status: DC | PRN

## 2020-11-07 NOTE — Discharge Summary (Addendum)
Physician Discharge Summary  Deanna Gomez ZOX:[id number] DOB: 08-14-1945 DOA: 11/04/2020  PCP: Lucky Cowboy, MD  Admit date: 11/04/2020 Discharge date: 11/07/2020  Time spent: 60 minutes  Recommendations for Outpatient Follow-up:  Follow-up PCP in 2 weeks Follow-up cardiology as outpatient in 4 weeks   Discharge Diagnoses:  Active Problems:   Essential hypertension   Hyperlipidemia, mixed   Syncope   AKI (acute kidney injury) (HCC)   Fall at home, initial encounter   Discharge Condition: Stable  Diet recommendation: Heart healthy diet  Filed Weights   11/04/20 1630  Weight: 95.1 kg    History of present illness:   75 year old female with medical history of hypertension, chronic kidney disease stage II, hyperlipidemia, prediabetes who has been taking laxative for constipation.  Patient states that she had multiple bowel movements.  When standing up from commode she felt increasingly lightheaded and had a syncopal episode.  She fell in the bathroom.  She was eventually able to get up.  And has been feeling dizzy.  In the ED she was found to have positive orthostatic hypotension  Hospital Course:  Syncope -Resolved, likely from polypharmacy, patient has been on multiple antihypertensive medications -Echocardiogram showed EF 60%, grade 1 diastolic dysfunction -Syncope has resolved.  Home medications have been adjusted  New onset atrial fibrillation -Patient's EKG showed atrial fibrillation, telemetry also showed intermittent episodes of atrial flutter/fibrillation -CHA2DS2VASc score of 5 -Patient was started on Eliquis for anticoagulation -Heart rate was controlled on atenolol -Cardiology was consulted, they recommended follow-up as outpatient for event monitor/Holter -We will continue with anticoagulation as outpatient -We will discontinue aspirin which patient was taking at home and continue with Eliquis to reduce the risk of bleeding.  Patient does not have any  clear indication for aspirin.  Acute kidney injury -Likely medication induced with hypotension -Improved with IV fluids -Creatinine is down to 1.04, at baseline  Hypokalemia -Patient developed severe hypokalemia in the hospital with potassium 2.5, -Potassium replaced, today potassium is 3.3 -We will give additional dose of K-Lor 40 mg p.o. x1 before discharge -Dose of HCTZ has been cut down from 25 mg to 12.5 mg to be taken as outpatient  Hypertension -Patient takes atenolol 100 mg daily, HCTZ 25 mg daily, losartan 100 mg daily at home -She presented with syncope so these medications were held except for atenolol which was continued -We will cut down the dose of HCTZ to 12.5 mg daily and losartan to 50 mg daily.  Continue atenolol 100 mg p.o. daily.  Diabetes mellitus type 2 -CBG well controlled -Continue Farxiga  Fall -PT evaluation obtained -Rolling walker with 5 inch wheels recommended  Hyperlipidemia -Continue rosuvastatin  Procedures: Echocardiogram  Consultations: Cardiology  Discharge Exam: Vitals:   11/07/20 0508 11/07/20 1040  BP: (!) 199/86 (!) 164/80  Pulse: (!) 57 (!) 57  Resp: 13   Temp: 97.8 F (36.6 C)   SpO2: 96% 100%    General: Appears in no acute distress Cardiovascular: S1-S2, regular Respiratory: Clear to auscultation bilaterally  Discharge Instructions   Discharge Instructions     Diet - low sodium heart healthy   Complete by: As directed    Increase activity slowly   Complete by: As directed       Allergies as of 11/07/2020       Reactions   Fentanyl Other (See Comments)   Intolerlance        Medication List     STOP taking these medications    aspirin EC  81 MG tablet   hydrochlorothiazide 25 MG tablet Commonly known as: HYDRODIURIL Replaced by: hydrochlorothiazide 12.5 MG capsule       TAKE these medications    ALPRAZolam 0.5 MG tablet Commonly known as: XANAX Take    1/2 - 1 tablet    1 to 2  x /day     ONLY    if needed for Anxiety Attack or sleep  &  limit to 5 days /week to avoid Addiction & Dementia What changed:  how much to take how to take this when to take this reasons to take this additional instructions   apixaban 5 MG Tabs tablet Commonly known as: ELIQUIS Take 1 tablet (5 mg total) by mouth 2 (two) times daily.   atenolol 100 MG tablet Commonly known as: TENORMIN Take 100 mg by mouth daily.   dapagliflozin propanediol 10 MG Tabs tablet Commonly known as: FARXIGA Take 1 tablet by mouth daily. For kidney protection.   hydrochlorothiazide 12.5 MG capsule Commonly known as: Microzide Take 1 capsule (12.5 mg total) by mouth daily. Replaces: hydrochlorothiazide 25 MG tablet   losartan 50 MG tablet Commonly known as: COZAAR Take 1 tablet (50 mg total) by mouth daily. What changed:  medication strength how much to take   nystatin powder Commonly known as: nystatin Apply daily after a shower as needed What changed:  how much to take how to take this when to take this reasons to take this   rosuvastatin 40 MG tablet Commonly known as: CRESTOR Take  1 tablet  Daily  for Cholesterol   senna 8.6 MG Tabs tablet Commonly known as: SENOKOT Take 1 tablet (8.6 mg total) by mouth daily as needed for mild constipation.   Systane 0.4-0.3 % Soln Generic drug: Polyethyl Glycol-Propyl Glycol Apply 1 drop to eye 3 (three) times daily as needed (dry eyes).   Vitamin D3 50 MCG (2000 UT) capsule Take 2,000 Units by mouth daily.               Durable Medical Equipment  (From admission, onward)           Start     Ordered   11/07/20 1057  For home use only DME Walker rolling  Once       Question Answer Comment  Walker: With 5 Inch Wheels   Patient needs a walker to treat with the following condition Syncope      11/07/20 1056   11/05/20 1210  For home use only DME Walker rolling  Once       Question Answer Comment  Walker: With 5 Inch Wheels   Patient  needs a walker to treat with the following condition Orthostatic hypotension      11/05/20 1209           Allergies  Allergen Reactions   Fentanyl Other (See Comments)    Intolerlance    Follow-up Information     Dyann Kief, PA-C Follow up on 12/08/2020.   Specialty: Cardiology Why: Please arrive 15 minutes early for your 9:45am post-hospital cardiology appointment Contact information: [address] [place] [address] [phone]         Lucky Cowboy, MD Follow up in 2 week(s).   Specialty: Internal Medicine Contact information: [address] [place] [address] [phone]         [name], MD .   Specialty: Cardiology Contact information: [address] [place] [address] [phone]  The results of significant diagnostics from this hospitalization (including imaging, microbiology, ancillary and laboratory) are listed below for reference.    Significant Diagnostic Studies: DG Ribs Unilateral W/Chest Left  Result Date: 11/04/2020 CLINICAL DATA:  Left rib pain after fall EXAM: LEFT RIBS AND CHEST - 3+ VIEW COMPARISON:  01/19/2014 FINDINGS: No fracture or other bone lesions are seen involving the ribs. There is no evidence of pneumothorax or pleural effusion. Both lungs are clear. Heart size and mediastinal contours are within normal limits. IMPRESSION: Negative. Electronically Signed   By: Duanne GuessNicholas  Plundo D.O.   On: 11/04/2020 14:58   DG Thoracic Spine 2 View  Result Date: 11/04/2020 CLINICAL DATA:  Low back pain after fall 1 week ago. EXAM: THORACIC SPINE 2 VIEWS COMPARISON:  None. FINDINGS: There is no evidence of thoracic spine fracture. Alignment is normal. No other significant bone abnormalities are identified. IMPRESSION: No acute abnormality is noted. Electronically Signed   By: Lupita RaiderJames  Green Jr M.D.   On: 11/04/2020 14:59   ECHOCARDIOGRAM  COMPLETE  Result Date: 11/05/2020    ECHOCARDIOGRAM REPORT   Patient Name:   Isaias CowmanSUZANNE B Nunziato Date of Exam: 11/05/2020 Medical Rec #:  [id number]           Height:       65.5 in Accession #:    [id number]          Weight:       209.7 lb Date of Birth:  1946-01-07           BSA:          2.030 m Patient Age:    75 years            BP:           136/70 mmHg Patient Gender: F                   HR:           74 bpm. Exam Location:  Inpatient Procedure: 2D Echo, Cardiac Doppler and Color Doppler Indications:    R55 Syncope  History:        Patient has prior history of Echocardiogram examinations, most                 recent 11/28/2019. Signs/Symptoms:Syncope; Risk                 Factors:Hypertension.  Sonographer:    MM Referring Phys: [id number] JEHANZEB MEMON IMPRESSIONS  1. Left ventricular ejection fraction, by estimation, is 60%. The left ventricle has normal function. The left ventricle has no regional wall motion abnormalities. There is severe concentric left ventricular hypertrophy. Left ventricular diastolic parameters are consistent with Grade I diastolic dysfunction (impaired relaxation).  2. Right ventricular systolic function is normal. The right ventricular size is normal. There is normal pulmonary artery systolic pressure.  3. The mitral valve is grossly normal. No evidence of mitral valve regurgitation. No evidence of mitral stenosis.  4. The aortic valve is tricuspid. There is moderate calcification of the aortic valve. There is mild thickening of the aortic valve. Aortic valve regurgitation is not visualized.  5. Aaortic valve sclerosis is present, with no evidence of aortic valve stenosis.  6. Aortic dilatation noted. There is mild dilatation of the ascending aorta, measuring 42 mm.  7. The inferior vena cava is normal in size with greater than 50% respiratory variability, suggesting right atrial pressure of 3 mmHg. Comparison(s): A prior study was performed on 11/28/2019. Slight  increase in ascending aortic  size. FINDINGS  Left Ventricle: Left ventricular ejection fraction, by estimation, is 60%. The left ventricle has normal function. The left ventricle has no regional wall motion abnormalities. The left ventricular internal cavity size was small. There is severe concentric left ventricular hypertrophy. Left ventricular diastolic parameters are consistent with Grade I diastolic dysfunction (impaired relaxation). Right Ventricle: The right ventricular size is normal. No increase in right ventricular wall thickness. Right ventricular systolic function is normal. There is normal pulmonary artery systolic pressure. The tricuspid regurgitant velocity is 1.26 m/s, and  with an assumed right atrial pressure of 3 mmHg, the estimated right ventricular systolic pressure is 9.4 mmHg. Left Atrium: Left atrial size was normal in size. Right Atrium: Right atrial size was normal in size. Pericardium: There is no evidence of pericardial effusion. Mitral Valve: The mitral valve is grossly normal. Mild to moderate mitral annular calcification. No evidence of mitral valve regurgitation. No evidence of mitral valve stenosis. Tricuspid Valve: The tricuspid valve is grossly normal. Tricuspid valve regurgitation is not demonstrated. Aortic Valve: The aortic valve is tricuspid. There is moderate calcification of the aortic valve. There is mild thickening of the aortic valve. Aortic valve regurgitation is not visualized. Mild aortic valve sclerosis is present, with no evidence of aortic valve stenosis. Aortic valve mean gradient measures 5.0 mmHg. Aortic valve peak gradient measures 8.9 mmHg. Aortic valve area, by VTI measures 4.23 cm. Pulmonic Valve: The pulmonic valve was normal in structure. Pulmonic valve regurgitation is trivial. Aorta: Aortic dilatation noted. There is mild dilatation of the ascending aorta, measuring 42 mm. Venous: The inferior vena cava is normal in size with greater than 50% respiratory variability, suggesting right  atrial pressure of 3 mmHg. IAS/Shunts: The atrial septum is grossly normal.  LEFT VENTRICLE PLAX 2D LVIDd:         3.70 cm     Diastology LVIDs:         2.70 cm     LV e' medial:  7.29 cm/s LV PW:         1.50 cm     LV e' lateral: 9.46 cm/s LV IVS:        1.50 cm LVOT diam:     2.30 cm LV SV:         132 LV SV Index:   65 LVOT Area:     4.15 cm  LV Volumes (MOD) LV vol d, MOD A4C: 50.1 ml LV vol s, MOD A4C: 22.4 ml LV SV MOD A4C:     50.1 ml RIGHT VENTRICLE             IVC RV S prime:     12.90 cm/s  IVC diam: 1.30 cm TAPSE (M-mode): 2.8 cm LEFT ATRIUM             Index       RIGHT ATRIUM           Index LA diam:        2.70 cm 1.33 cm/m  RA Area:     11.30 cm LA Vol (A2C):   37.7 ml 18.57 ml/m RA Volume:   22.50 ml  11.08 ml/m LA Vol (A4C):   23.5 ml 11.58 ml/m LA Biplane Vol: 29.9 ml 14.73 ml/m  AORTIC VALVE AV Area (Vmax):    3.46 cm AV Area (Vmean):   3.75 cm AV Area (VTI):     4.23 cm AV Vmax:  149.00 cm/s AV Vmean:          105.000 cm/s AV VTI:            0.311 m AV Peak Grad:      8.9 mmHg AV Mean Grad:      5.0 mmHg LVOT Vmax:         124.00 cm/s LVOT Vmean:        94.700 cm/s LVOT VTI:          0.317 m LVOT/AV VTI ratio: 1.02  AORTA Ao Root diam: 3.10 cm Ao Asc diam:  4.20 cm MITRAL VALVE               TRICUSPID VALVE MV Area (PHT): 3.73 cm    TR Peak grad:   6.4 mmHg MV A velocity: 87.50 cm/s  TR Vmax:        126.00 cm/s                             SHUNTS                            Systemic VTI:  0.32 m                            Systemic Diam: 2.30 cm Riley Lam MD Electronically signed by Riley Lam MD Signature Date/Time: 11/05/2020/1:08:27 PM    Final     Microbiology: Recent Results (from the past 240 hour(s))  Resp Panel by RT-PCR (Flu A&B, Covid) Nasopharyngeal Swab     Status: None   Collection Time: 11/04/20  3:58 PM   Specimen: Nasopharyngeal Swab; Nasopharyngeal(NP) swabs in vial transport medium  Result Value Ref Range Status   SARS Coronavirus 2  by RT PCR NEGATIVE NEGATIVE Final    Comment: (NOTE) SARS-CoV-2 target nucleic acids are NOT DETECTED.  The SARS-CoV-2 RNA is generally detectable in upper respiratory specimens during the acute phase of infection. The lowest concentration of SARS-CoV-2 viral copies this assay can detect is 138 copies/mL. A negative result does not preclude SARS-Cov-2 infection and should not be used as the sole basis for treatment or other patient management decisions. A negative result may occur with  improper specimen collection/handling, submission of specimen other than nasopharyngeal swab, presence of viral mutation(s) within the areas targeted by this assay, and inadequate number of viral copies(<138 copies/mL). A negative result must be combined with clinical observations, patient history, and epidemiological information. The expected result is Negative.  Fact Sheet for Patients:  BloggerCourse.com  Fact Sheet for Healthcare Providers:  SeriousBroker.it  This test is no t yet approved or cleared by the [place] FDA and  has been authorized for detection and/or diagnosis of SARS-CoV-2 by FDA under an Emergency Use Authorization (EUA). This EUA will remain  in effect (meaning this test can be used) for the duration of the COVID-19 declaration under Section 564(b)(1) of the Act, 21 U.S.C.section 360bbb-3(b)(1), unless the authorization is terminated  or revoked sooner.       Influenza A by PCR NEGATIVE NEGATIVE Final   Influenza B by PCR NEGATIVE NEGATIVE Final    Comment: (NOTE) The Xpert Xpress SARS-CoV-2/FLU/RSV plus assay is intended as an aid in the diagnosis of influenza from Nasopharyngeal swab specimens and should not be used as a sole basis for treatment. Nasal washings and aspirates are unacceptable for  Xpert Xpress SARS-CoV-2/FLU/RSV testing.  Fact Sheet for Patients: BloggerCourse.com  Fact  Sheet for Healthcare Providers: SeriousBroker.it  This test is not yet approved or cleared by the [place] FDA and has been authorized for detection and/or diagnosis of SARS-CoV-2 by FDA under an Emergency Use Authorization (EUA). This EUA will remain in effect (meaning this test can be used) for the duration of the COVID-19 declaration under Section 564(b)(1) of the Act, 21 U.S.C. section 360bbb-3(b)(1), unless the authorization is terminated or revoked.  Performed at [facility], [address], [place], [place] [address]      Labs: Basic Metabolic Panel: Recent Labs  Lab 11/04/20 1317 11/05/20 0532 11/05/20 0956 11/06/20 0824 11/06/20 1757 11/06/20 2251 11/07/20 0722  NA 134* 133*  --  139  --  139 137  K 4.7 2.5*  --  2.8* 4.1 3.8 3.3*  CL 92* 94*  --  101  --  104 101  CO2 29 26  --  31  --  25 28  GLUCOSE 102* 92  --  102*  --  96 91  BUN 22 24*  --  18  --  23 19  CREATININE 1.40* 1.34*  --  1.10*  --  1.02* 1.04*  CALCIUM 10.1 9.4  --  9.6  --  9.6 9.5  MG  --   --  1.8  --   --   --   --    Liver Function Tests: Recent Labs  Lab 11/04/20 1317 11/05/20 0532 11/07/20 0722  AST 48* 22 19  ALT 26 17 15   ALKPHOS 66 54 56  BILITOT 2.0* 0.9 0.6  PROT 8.0 6.6 6.5  ALBUMIN 4.2 3.6 3.5   No results for input(s): LIPASE, AMYLASE in the last 168 hours. No results for input(s): AMMONIA in the last 168 hours. CBC: Recent Labs  Lab 11/04/20 1317 11/05/20 0532  WBC 9.4 8.1  HGB 15.0 13.1  HCT 43.2 37.8  MCV 97.5 97.4  PLT 269 239   Cardiac Enzymes: No results for input(s): CKTOTAL, CKMB, CKMBINDEX, TROPONINI in the last 168 hours. BNP: BNP (last 3 results) No results for input(s): BNP in the last 8760 hours.  ProBNP (last 3 results) No results for input(s): PROBNP in the last 8760 hours.  CBG: Recent Labs  Lab 11/06/20 0758 11/06/20 1128 11/06/20 1554 11/06/20 2027 11/07/20 0748  GLUCAP 97 95  102* 99 78       Signed:  01/07/21 MD.  [facility] 11/07/2020, 11:34 AM

## 2020-11-07 NOTE — TOC Transition Note (Signed)
Transition of Care [facility]) - CM/SW Discharge Note   Patient Details  Name: Deanna Gomez MRN: [id number] Date of Birth: 15-Feb-1946  Transition of [facility]) CM/SW Contact:  Larrie Kass, LCSW Phone Number: 11/07/2020, 12:10 PM   Clinical Narrative:     CSW spoke with pt , who stated she received her walker yesterday and no other needs at this time. TOC CSW Sign off   Final next level of care: Home/Self Care Barriers to Discharge: No Barriers Identified   Patient Goals and CMS Choice        Discharge Placement                       Discharge Plan and Services                  DME Agency: [employer]                  Social Determinants of Health (SDOH) Interventions     Readmission Risk Interventions No flowsheet data found.

## 2020-11-07 NOTE — Progress Notes (Signed)
Patient will be discharging later this morning with a friend. Belongings were returned. Education on medications will be provided.

## 2020-11-15 ENCOUNTER — Other Ambulatory Visit: Payer: Self-pay

## 2020-11-15 MED ORDER — ATENOLOL 100 MG PO TABS: 100.0000 mg | ORAL_TABLET | Freq: Every day | ORAL | 2 refills | Status: DC

## 2020-11-15 NOTE — Progress Notes (Signed)
Future Appointments  Date Time Provider Department Center  11/16/2020 11:30 AM Lucky Cowboy, MD [facility] None  12/08/2020  9:45 AM Dyann Kief, PA-C [facility]  05/03/2021  2:00 PM [name], NP [facility] None  07/22/2021 11:00 AM [name], NP [facility] None    [facility]     This very nice 75 y.o. [facility] was admitted to the hospital on  11/04/2020  and patient was discharged from the hospital 9 days ago on 11/07/2020.  The patient now presents for follow up for transition from recent hospitalization.  The day after discharge  our clinical staff contacted the patient to assure stability and schedule a follow up appointment. The discharge summary, medications and diagnostic test results were reviewed before meeting with the patient. The patient was admitted for:   Vasovagal syncope Fall at home, initial encounter Essential hypertension Hyperlipidemia, mixed AKI (acute kidney injury)  Hypokalemia Medication management      Patient was admitted thru the ER after a syncopal episode prompting a fall. Apparently patient had become dehydrated attributed to her diuretic and also consequent of self induced purge of constipation. EKG found new onset A Fib  (CHADs2-VASc  of 5)  and her atenolol was resumed and she was started on Eliquis.  Hypokalemia during hospitalization was corrected as she was re-hydrated.       Hospitalization discharge instructions and medications are reconciled with the patient.      Patient is also followed with Hypertension, Hyperlipidemia, Pre-Diabetes and Vitamin D Deficiency.      Patient is treated for HTN (1996) & BP has been controlled at home. Today's BP: is at goal - 118/72. Patient has had no complaints of any cardiac type chest pain, palpitations, dyspnea/orthopnea/PND, dizziness, claudication, or dependent edema.     Hyperlipidemia is controlled with diet & meds. Patient denies myalgias or other med SE's. Last  Lipids were not at goal:  Lab Results  Component Value Date   CHOL 235 (H) 07/22/2020   HDL 102 07/22/2020   LDLCALC 111 (H) 07/22/2020   TRIG 111 07/22/2020   CHOLHDL 2.3 07/22/2020      Also, the patient has history of PreDiabetes (A1c 5.8% /2016 ) w/CKD3a  (GFR 57) and has had no symptoms of reactive hypoglycemia, diabetic polys, paresthesias or visual blurring.  Last A1c was near goal:  Lab Results  Component Value Date   HGBA1C 5.7 (H) 11/05/2020      Further, the patient also has history of Vitamin D Deficiency  ("12" /2008) and supplements vitamin D without any suspected side-effects. Last vitamin D was  low  (goal is betw 70-100):  Lab Results  Component Value Date   VD25OH 42 05/03/2020   Current Outpatient Medications on File Prior to Visit  Medication Sig   ALPRAZolam (XANAX) 0.5 MG tablet Take    1/2 - 1 tablet    1 to 2  x /day    ONLY    if needed for Anxiety Attack or sleep  &  limit to 5 days /week to avoid Addiction & Dementia (Patient taking differently: Take 0.25-0.5 mg by mouth daily as needed for sleep or anxiety. Limit to 5 days /week to avoid Addiction & Dementia)   apixaban (ELIQUIS) 5 MG TABS tablet Take 1 tablet (5 mg total) by mouth 2 (two) times daily.   atenolol (TENORMIN) 100 MG tablet Take 1 tablet (100 mg total) by mouth daily.   Cholecalciferol (VITAMIN D3) 50 MCG (2000 UT) capsule  Take 2,000 Units by mouth daily.   dapagliflozin propanediol (FARXIGA) 10 MG TABS tablet Take 1 tablet by mouth daily. For kidney protection.   hydrochlorothiazide (MICROZIDE) 12.5 MG capsule Take 1 capsule (12.5 mg total) by mouth daily.   losartan (COZAAR) 50 MG tablet Take 1 tablet (50 mg total) by mouth daily.   nystatin (NYSTATIN) powder Apply daily after a shower as needed (Patient taking differently: Apply 1 application topically daily as needed (rash). Apply daily after a shower as needed)   Polyethyl Glycol-Propyl Glycol (SYSTANE) 0.4-0.3 % SOLN Apply 1 drop to eye  3 (three) times daily as needed (dry eyes).   rosuvastatin (CRESTOR) 40 MG tablet Take  1 tablet  Daily  for Cholesterol   senna (SENOKOT) 8.6 MG TABS tablet Take 1 tablet (8.6 mg total) by mouth daily as needed for mild constipation.    Allergies  Allergen Reactions   Fentanyl Other (See Comments)    Intolerlance   PMHx:   Past Medical History:  Diagnosis Date   Abdominal aortic atherosclerosis (HCC) 12/26/2016   Noted incidentally on 12/24/2016 CT   Gastric ulcer 11/19/2014   Hepatic steatosis 12/26/2016   Noted on CT 12/24/2016 - moderate degree without distinct lesions   Hepatitis C    HTN (hypertension)    Hyperlipidemia    Obesity    Pre-diabetes    Scoliosis    Tobacco use    stopped 6 years ago.   Vitamin D deficiency    Immunization History  Administered Date(s) Administered   DTaP 03/06/2006   Influenza Split 11/20/2013, 12/24/2014   Influenza Whole 12/19/2012   Influenza, High Dose  11/08/2018, 12/04/2019   PFIZER SARS-COV-2 Vacci 03/27/2019, 04/17/2019, 12/15/2019   Pneumococcal -13 05/13/2014   Pneumococcal --23 03/06/2008, 10/25/2016   Typhoid Live 03/06/2004   Zoster, Live 06/11/2007   Past Surgical History:  Procedure Laterality Date   BACK SURGERY  1986   x 3   BREAST RECONSTRUCTION     x 2   CATARACT EXTRACTION     Bilateral   FHx:    Reviewed / unchanged  SHx:    Reviewed / unchanged  Systems Review:  Constitutional: Denies fever, chills, wt changes, headaches, insomnia, fatigue, night sweats, change in appetite. Eyes: Denies redness, blurred vision, diplopia, discharge, itchy, watery eyes.  ENT: Denies discharge, congestion, post nasal drip, epistaxis, sore throat, earache, hearing loss, dental pain, tinnitus, vertigo, sinus pain, snoring.  CV: Denies chest pain, palpitations, irregular heartbeat, syncope, dyspnea, diaphoresis, orthopnea, PND, claudication or edema. Respiratory: denies cough, dyspnea, DOE, pleurisy, hoarseness, laryngitis,  wheezing.  Gastrointestinal: Denies dysphagia, odynophagia, heartburn, reflux, water brash, abdominal pain or cramps, nausea, vomiting, bloating, diarrhea, constipation, hematemesis, melena, hematochezia  or hemorrhoids. Genitourinary: Denies dysuria, frequency, urgency, nocturia, hesitancy, discharge, hematuria or flank pain. Musculoskeletal: Denies arthralgias, myalgias, stiffness, jt. swelling, pain, limping or strain/sprain.  Skin: Denies pruritus, rash, hives, warts, acne, eczema or change in skin lesion(s). Neuro: No weakness, tremor, incoordination, spasms, paresthesia or pain. Psychiatric: Denies confusion, memory loss or sensory loss. Endo: Denies change in weight, skin or hair change.  Heme/Lymph: No excessive bleeding, bruising or enlarged lymph nodes.  Physical Exam  BP 118/72   Pulse 64   Temp 97.9 F (36.6 C)   Resp 17   Ht 5' 5.5" (1.664 m)   Wt 212 lb (96.2 kg)   SpO2 96%   BMI 34.74 kg/m   Appears well nourished, well groomed  and in no distress.  Eyes: PERRLA,  EOMs, conjunctiva no swelling or erythema. Sinuses: No frontal/maxillary tenderness ENT/Mouth: EAC's clear, TM's nl w/o erythema, bulging. Nares clear w/o erythema, swelling, exudates. Oropharynx clear without erythema or exudates. Oral hygiene is good. Tongue normal, non obstructing. Hearing intact.  Neck: Supple. Thyroid nl. Car 2+/2+ without bruits, nodes or JVD. Chest: Respirations nl with BS clear & equal w/o rales, rhonchi, wheezing or stridor.  Cor: Heart sounds normal w/ regular rate and rhythm without sig. murmurs, gallops, clicks or rubs. Peripheral pulses normal and equal  without edema.  Abdomen: Soft & bowel sounds normal. Non-tender w/o guarding, rebound, hernias, masses or organomegaly.  Lymphatics: Unremarkable.  Musculoskeletal: Full ROM all peripheral extremities, joint stability, 5/5 strength and normal gait.  Skin: Warm, dry without exposed rashes, lesions or ecchymosis apparent.  Neuro:  Cranial nerves intact, reflexes equal bilaterally. Sensory-motor testing grossly intact. Tendon reflexes grossly intact.  Pysch: Alert & oriented x 3.  Insight and judgement nl & appropriate. No ideations.  Assessment and Plan:  1. Vasovagal syncope  - CBC with Differential/Platelet - COMPLETE METABOLIC PANEL WITH GFR - Magnesium  2. Fall at home, initial encounter   3. Essential hypertension  - Continue medication, monitor blood pressure at home.  - Continue DASH diet.  Reminder to go to the ER if any CP,  SOB, nausea, dizziness, severe HA, changes vision/speech. - CBC with Differential/Platelet - COMPLETE METABOLIC PANEL WITH GFR - Magnesium  4. Hyperlipidemia, mixed  - Continue diet/meds, exercise,& lifestyle modifications.  - Continue monitor periodic cholesterol/liver & renal functions     5. AKI (acute kidney injury) (HCC)  - COMPLETE METABOLIC PANEL WITH GFR  6. Hypokalemia  - COMPLETE METABOLIC PANEL WITH GFR - Magnesium  7. Medication management  - CBC with Differential/Platelet - COMPLETE METABOLIC PANEL WITH GFR - Magnesium         Discussed  regular exercise, BP monitoring, weight control to achieve/maintain BMI less than 25 and discussed meds and SE's. Recommended labs to assess and monitor clinical status with further disposition pending results of labs. Over 30 minutes of exam, counseling, chart review was performed.   Marinus Maw, MD

## 2020-11-16 ENCOUNTER — Other Ambulatory Visit: Payer: Self-pay

## 2020-11-16 ENCOUNTER — Encounter: Payer: Self-pay | Admitting: Internal Medicine

## 2020-11-16 ENCOUNTER — Ambulatory Visit (INDEPENDENT_AMBULATORY_CARE_PROVIDER_SITE_OTHER): Payer: Medicare PPO | Admitting: Internal Medicine

## 2020-11-16 VITALS — BP 118/72 | HR 64 | Temp 97.9°F | Resp 17 | Ht 65.5 in | Wt 212.0 lb

## 2020-11-16 DIAGNOSIS — Z79899 Other long term (current) drug therapy: Secondary | ICD-10-CM

## 2020-11-16 DIAGNOSIS — E782 Mixed hyperlipidemia: Secondary | ICD-10-CM

## 2020-11-16 DIAGNOSIS — I10 Essential (primary) hypertension: Secondary | ICD-10-CM

## 2020-11-16 DIAGNOSIS — R55 Syncope and collapse: Secondary | ICD-10-CM | POA: Diagnosis not present

## 2020-11-16 DIAGNOSIS — E876 Hypokalemia: Secondary | ICD-10-CM

## 2020-11-16 DIAGNOSIS — N179 Acute kidney failure, unspecified: Secondary | ICD-10-CM

## 2020-11-16 DIAGNOSIS — Y92009 Unspecified place in unspecified non-institutional (private) residence as the place of occurrence of the external cause: Secondary | ICD-10-CM

## 2020-11-16 DIAGNOSIS — W19XXXA Unspecified fall, initial encounter: Secondary | ICD-10-CM

## 2020-11-16 NOTE — Patient Instructions (Signed)
Atrial Fibrillation  Atrial fibrillation is a type of irregular or rapid heartbeat (arrhythmia). In atrial fibrillation, the top part of the heart (atria) beats in an irregular pattern. This makes the heart unable to pump bloodnormally and effectively. The goal of treatment is to prevent blood clots from forming, control your heart rate, or restore your heartbeat to a normal rhythm. If this condition is not treated, it can cause serious problems, such as a weakened heart muscle (cardiomyopathy) or a stroke. What are the causes? This condition is often caused by medical conditions that damage the heart's electrical system. These include: High blood pressure (hypertension). This is the most common cause. Certain heart problems or conditions, such as heart failure, coronary artery disease, heart valve problems, or heart surgery. Diabetes. Overactive thyroid (hyperthyroidism). Obesity. Chronic kidney disease. In some cases, the cause of this condition is not known. What increases the risk? This condition is more likely to develop in: Older people. People who smoke. Athletes who do endurance exercise. People who have a family history of atrial fibrillation. Men. People who use drugs. People who drink a lot of alcohol. People who have lung conditions, such as emphysema, pneumonia, or COPD. People who have obstructive sleep apnea. What are the signs or symptoms? Symptoms of this condition include: A feeling that your heart is racing or beating irregularly. Discomfort or pain in your chest. Shortness of breath. Sudden light-headedness or weakness. Tiring easily during exercise or activity. Fatigue. Syncope (fainting). Sweating. In some cases, there are no symptoms. How is this diagnosed? Your health care provider may detect atrial fibrillation when taking your pulse. If detected, this condition may be diagnosed with: An electrocardiogram (ECG) to check electrical signals of the  heart. An ambulatory cardiac monitor to record your heart's activity for a few days. A transthoracic echocardiogram (TTE) to create pictures of your heart. A transesophageal echocardiogram (TEE) to create even closer pictures of your heart. A stress test to check your blood supply while you exercise. Imaging tests, such as a CT scan or chest X-ray. Blood tests. How is this treated? Treatment depends on underlying conditions and how you feel when you experience atrial fibrillation. This condition may be treated with: Medicines to prevent blood clots or to treat heart rate or heart rhythm problems. Electrical cardioversion to reset the heart's rhythm. A pacemaker to correct abnormal heart rhythm. Ablation to remove the heart tissue that sends abnormal signals. Left atrial appendage closure to seal the area where blood clots can form. In some cases, underlying conditions will be treated. Follow these instructions at home: Medicines Take over-the counter and prescription medicines only as told by your health care provider. Do not take any new medicines without talking to your health care provider. If you are taking blood thinners: Talk with your health care provider before you take any medicines that contain aspirin or NSAIDs, such as ibuprofen. These medicines increase your risk for dangerous bleeding. Take your medicine exactly as told, at the same time every day. Avoid activities that could cause injury or bruising, and follow instructions about how to prevent falls. Wear a medical alert bracelet or carry a card that lists what medicines you take. Lifestyle     Do not use any products that contain nicotine or tobacco, such as cigarettes, e-cigarettes, and chewing tobacco. If you need help quitting, ask your health care provider. Eat heart-healthy foods. Talk with a dietitian to make an eating plan that is right for you. Exercise regularly as told by   your health care provider. Do not  drink alcohol. Lose weight if you are overweight. Do not use drugs, including cannabis. General instructions If you have obstructive sleep apnea, manage your condition as told by your health care provider. Do not use diet pills unless your health care provider approves. Diet pills can make heart problems worse. Keep all follow-up visits as told by your health care provider. This is important. Contact a health care provider if you: Notice a change in the rate, rhythm, or strength of your heartbeat. Are taking a blood thinner and you notice more bruising. Tire more easily when you exercise or do heavy work. Have a sudden change in weight. Get help right away if you have:  Chest pain, abdominal pain, sweating, or weakness. Trouble breathing. Side effects of blood thinners, such as blood in your vomit, stool, or urine, or bleeding that cannot stop. Any symptoms of a stroke. "BE FAST" is an easy way to remember the main warning signs of a stroke: B - Balance. Signs are dizziness, sudden trouble walking, or loss of balance. E - Eyes. Signs are trouble seeing or a sudden change in vision. F - Face. Signs are sudden weakness or numbness of the face, or the face or eyelid drooping on one side. A - Arms. Signs are weakness or numbness in an arm. This happens suddenly and usually on one side of the body. S - Speech. Signs are sudden trouble speaking, slurred speech, or trouble understanding what people say. T - Time. Time to call emergency services. Write down what time symptoms started. Other signs of a stroke, such as: A sudden, severe headache with no known cause. Nausea or vomiting. Seizure. These symptoms may represent a serious problem that is an emergency. Do not wait to see if the symptoms will go away. Get medical help right away. Call your local emergency services (911 in the [place] Do not drive yourself to the hospital. Summary Atrial fibrillation is a type of irregular or rapid  heartbeat (arrhythmia). Symptoms include a feeling that your heart is beating fast or irregularly. You may be given medicines to prevent blood clots or to treat heart rate or heart rhythm problems. Get help right away if you have signs or symptoms of a stroke. Get help right away if you cannot catch your breath or have chest pain or pressure. This information is not intended to replace advice given to you by your health care provider. Make sure you discuss any questions you have with your healthcare provider. Document Revised: 08/14/2018 Document Reviewed: 08/14/2018 Elsevier Patient Education  2022 [employer]  

## 2020-11-17 ENCOUNTER — Other Ambulatory Visit: Payer: Self-pay | Admitting: Internal Medicine

## 2020-11-17 DIAGNOSIS — G4700 Insomnia, unspecified: Secondary | ICD-10-CM

## 2020-11-17 LAB — CBC WITH DIFFERENTIAL/PLATELET
Absolute Monocytes: 630 cells/uL (ref 200–950)
Basophils Absolute: 63 cells/uL (ref 0–200)
Basophils Relative: 0.6 %
Eosinophils Absolute: 42 cells/uL (ref 15–500)
Eosinophils Relative: 0.4 %
HCT: 42.3 % (ref 35.0–45.0)
Hemoglobin: 14.6 g/dL (ref 11.7–15.5)
Lymphs Abs: 1565 cells/uL (ref 850–3900)
MCH: 33.9 pg — ABNORMAL HIGH (ref 27.0–33.0)
MCHC: 34.5 g/dL (ref 32.0–36.0)
MCV: 98.1 fL (ref 80.0–100.0)
MPV: 10.2 fL (ref 7.5–12.5)
Monocytes Relative: 6 %
Neutro Abs: 8201 cells/uL — ABNORMAL HIGH (ref 1500–7800)
Neutrophils Relative %: 78.1 %
Platelets: 309 10*3/uL (ref 140–400)
RBC: 4.31 10*6/uL (ref 3.80–5.10)
RDW: 12.1 % (ref 11.0–15.0)
Total Lymphocyte: 14.9 %
WBC: 10.5 10*3/uL (ref 3.8–10.8)

## 2020-11-17 LAB — COMPLETE METABOLIC PANEL WITH GFR
AG Ratio: 1.7 (calc) (ref 1.0–2.5)
ALT: 16 U/L (ref 6–29)
AST: 24 U/L (ref 10–35)
Albumin: 4.7 g/dL (ref 3.6–5.1)
Alkaline phosphatase (APISO): 95 U/L (ref 37–153)
BUN/Creatinine Ratio: 15 (calc) (ref 6–22)
BUN: 15 mg/dL (ref 7–25)
CO2: 29 mmol/L (ref 20–32)
Calcium: 10.3 mg/dL (ref 8.6–10.4)
Chloride: 93 mmol/L — ABNORMAL LOW (ref 98–110)
Creat: 1.02 mg/dL — ABNORMAL HIGH (ref 0.60–1.00)
Globulin: 2.7 g/dL (calc) (ref 1.9–3.7)
Glucose, Bld: 93 mg/dL (ref 65–99)
Potassium: 4.5 mmol/L (ref 3.5–5.3)
Sodium: 136 mmol/L (ref 135–146)
Total Bilirubin: 0.5 mg/dL (ref 0.2–1.2)
Total Protein: 7.4 g/dL (ref 6.1–8.1)
eGFR: 57 mL/min/{1.73_m2} — ABNORMAL LOW (ref >=60)

## 2020-11-17 LAB — MAGNESIUM: Magnesium: 1.9 mg/dL (ref 1.5–2.5)

## 2020-11-17 MED ORDER — ALPRAZOLAM 0.5 MG PO TABS: ORAL_TABLET | ORAL | 0 refills | Status: DC

## 2020-11-17 NOTE — Progress Notes (Signed)
============================================================ -   Test results slightly outside the reference range are not unusual. If there is anything important, I will review this with you,  otherwise it is considered normal test values.  If you have further questions,  please do not hesitate to contact me at the office or via My Chart.  ============================================================ ============================================================  -  CBC - Glucose - Kidneys - Electrolytes - Liver &  Magnesium   - all  Normal / OK  ============================================================ ============================================================

## 2020-11-26 DIAGNOSIS — L821 Other seborrheic keratosis: Secondary | ICD-10-CM | POA: Diagnosis not present

## 2020-11-26 DIAGNOSIS — L570 Actinic keratosis: Secondary | ICD-10-CM | POA: Diagnosis not present

## 2020-12-01 NOTE — Progress Notes (Signed)
Cardiology Office Note    Date:  12/08/2020   ID:  Deanna Gomez, [name] 1945/06/25, MRN [id number]   PCP:  Lucky Cowboy, MD   [facility]  Cardiologist:  Verne Carrow, MD   Advanced Practice Provider:  No care team member to display Electrophysiologist:  None   [id number]   Chief Complaint  Patient presents with   Hospitalization Follow-up     History of Present Illness:  Deanna Gomez is a 75 y.o. female with a hx of HTN, DM2, HLP, CKD 2.   Patient was admitted 11/04/2020 with syncope and fall after marked increase in bowel movements due to laxative use and found to be in atrial fibrillation with borderline ventricular rate.  She is chronically on atenolol for hypertension.  She was asymptomatic with the A. fib.  2D echo normal LVEF with severe LVH mild to moderate by measurement.  Mild worsening of renal parameters with hypovolemia, severe hypokalemia.  A. fib in the setting of major electrolyte abnormality but CHA2DS2-VASc was 6 so she was placed on Eliquis.  HCTZ was stopped until potassium was corrected.  Patient comes in f/u. Feels good, back to normal. Denies palpitations, dyspnea, dizziness, chest pain, edema. Getting ready to move to [facility].   Past Medical History:  Diagnosis Date   Abdominal aortic atherosclerosis (HCC) 12/26/2016   Noted incidentally on 12/24/2016 CT   Gastric ulcer 11/19/2014   Hepatic steatosis 12/26/2016   Noted on CT 12/24/2016 - moderate degree without distinct lesions   Hepatitis C    HTN (hypertension)    Hyperlipidemia    Obesity    Pre-diabetes    Scoliosis    Tobacco use    stopped 6 years ago.   Vitamin D deficiency     Past Surgical History:  Procedure Laterality Date   BACK SURGERY  1986   x 3   BREAST RECONSTRUCTION     x 2   CATARACT EXTRACTION     Bilateral    Current Medications: Current Meds  Medication Sig   ALPRAZolam (XANAX) 0.5 MG tablet Take    1/2 - 1  tablet    1 to 2  x /day    ONLY    if needed for Anxiety Attack or sleep  &  limit to 5 days /week to avoid Addiction & Dementia   apixaban (ELIQUIS) 5 MG TABS tablet Take 1 tablet (5 mg total) by mouth 2 (two) times daily.   atenolol (TENORMIN) 100 MG tablet Take 1 tablet (100 mg total) by mouth daily.   Cholecalciferol (VITAMIN D3) 50 MCG (2000 UT) capsule Take 2,000 Units by mouth daily.   dapagliflozin propanediol (FARXIGA) 10 MG TABS tablet Take 1 tablet by mouth daily. For kidney protection.   hydrochlorothiazide (MICROZIDE) 12.5 MG capsule Take 1 capsule (12.5 mg total) by mouth daily.   losartan (COZAAR) 50 MG tablet Take 1 tablet (50 mg total) by mouth daily.   nystatin (NYSTATIN) powder Apply daily after a shower as needed   Polyethyl Glycol-Propyl Glycol (SYSTANE) 0.4-0.3 % SOLN Apply 1 drop to eye 3 (three) times daily as needed (dry eyes).   rosuvastatin (CRESTOR) 40 MG tablet Take  1 tablet  Daily  for Cholesterol   senna (SENOKOT) 8.6 MG TABS tablet Take 1 tablet (8.6 mg total) by mouth daily as needed for mild constipation.     Allergies:   Fentanyl   Social History   Socioeconomic History  Marital status: Married    Spouse name: Not on file   Number of children: 1   Years of education: Not on file   Highest education level: Not on file  Occupational History   Occupation: Retired Teacher, early years/pre  Tobacco Use   Smoking status: Former    Packs/day: 1.00    Years: 30.00    Pack years: 30.00    Types: Cigarettes    Quit date: 08/28/2004    Years since quitting: 16.2   Smokeless tobacco: Never  Vaping Use   Vaping Use: Never used  Substance and Sexual Activity   Alcohol use: Yes    Alcohol/week: 14.0 standard drinks    Types: 14 Standard drinks or equivalent per week    Comment: Drinks 2-3 glasses wine nightly x 20 years   Drug use: No   Sexual activity: Not Currently    Birth control/protection: Post-menopausal  Other Topics Concern   Not on file   Social History Narrative   Lives with husband in a two story home.  Has 1 child and 4 stepchildren. Retired Teacher, early years/pre.  Education: Masters.     Social Determinants of Health   Financial Resource Strain: Not on file  Food Insecurity: Not on file  Transportation Needs: Not on file  Physical Activity: Not on file  Stress: Not on file  Social Connections: Not on file     Family History:  The patient's  family history includes Coronary artery disease in her brother; Hyperlipidemia in her father; Obesity in her brother.   ROS:   Please see the history of present illness.    ROS All other systems reviewed and are negative.   PHYSICAL EXAM:   VS:  BP 130/70 (BP Location: Left Arm, Patient Position: Sitting, Cuff Size: Normal)   Pulse 84   Ht 5' (1.524 m)   Wt 218 lb (98.9 kg)   SpO2 98%   BMI 42.58 kg/m   Physical Exam  GEN: Well nourished, well developed, in no acute distress  Neck: no JVD, carotid bruits, or masses Cardiac:RRR; no murmurs, rubs, or gallops  Respiratory:  clear to auscultation bilaterally, normal work of breathing GI: soft, nontender, nondistended, + BS Ext: without cyanosis, clubbing, or edema, Good distal pulses bilaterally Neuro:  Alert and Oriented x 3 Psych: euthymic mood, full affect  Wt Readings from Last 3 Encounters:  12/08/20 218 lb (98.9 kg)  11/16/20 212 lb (96.2 kg)  11/04/20 209 lb 11.2 oz (95.1 kg)      Studies/Labs Reviewed:   EKG:  EKG is not ordered today.    Recent Labs: 11/05/2020: TSH 2.487 11/16/2020: ALT 16; BUN 15; Creat 1.02; Hemoglobin 14.6; Magnesium 1.9; Platelets 309; Potassium 4.5; Sodium 136   Lipid Panel    Component Value Date/Time   CHOL 235 (H) 07/22/2020 1145   TRIG 111 07/22/2020 1145   HDL 102 07/22/2020 1145   CHOLHDL 2.3 07/22/2020 1145   VLDL 23 07/06/2016 1200   LDLCALC 111 (H) 07/22/2020 1145    Additional studies/ records that were reviewed today include:  Echocardiogram 11/05/2020      1. Left ventricular ejection fraction, by estimation, is 60%. The left  ventricle has normal function. The left ventricle has no regional wall  motion abnormalities. There is severe concentric left ventricular  hypertrophy. Left ventricular diastolic  parameters are consistent with Grade I diastolic dysfunction (impaired  relaxation).   2. Right ventricular systolic function is normal. The right ventricular  size  is normal. There is normal pulmonary artery systolic pressure.   3. The mitral valve is grossly normal. No evidence of mitral valve  regurgitation. No evidence of mitral stenosis.   4. The aortic valve is tricuspid. There is moderate calcification of the  aortic valve. There is mild thickening of the aortic valve. Aortic valve  regurgitation is not visualized.   5. Aaortic valve sclerosis is present, with no evidence of aortic valve  stenosis.   6. Aortic dilatation noted. There is mild dilatation of the ascending  aorta, measuring 42 mm.   7. The inferior vena cava is normal in size with greater than 50%  respiratory variability, suggesting right atrial pressure of 3 mmHg.   Comparison(s): A prior study was performed on 11/28/2019. Slight increase  in ascending aortic size.      Risk Assessment/Calculations:    CHA2DS2-VASc Score = 6   This indicates a 9.7% annual risk of stroke. The patient's score is based upon: CHF History: 0 HTN History: 1 Diabetes History: 1 Stroke History: 0 Vascular Disease History: 1 Age Score: 2 Gender Score: 1        ASSESSMENT:    1. Paroxysmal atrial fibrillation (HCC)   2. Essential hypertension   3. Hyperlipidemia, unspecified hyperlipidemia type   4. Nonrheumatic aortic valve insufficiency      PLAN:  In order of problems listed above: PAF in the setting of major electrolyte abnormality converted to normal sinus rhythm spontaneously CHA2DS2-VASc equals 6 placed on Eliquis.  It was felt she had a good chance of remaining in  normal sinus rhythm but with high embolic risk would recommend Eliquis unless clear evidence of arrhythmia absence over a long period of time. Heart rate regular today and no complaints. No bleeding problems on eliquis.   Hypertension-BP controlled  Hyperlipidemia on rosuvastatin  LDL 111 07/22/20  Aortic insufficiency does not appear to be significant   Shared Decision Making/Informed Consent        Medication Adjustments/Labs and Tests Ordered: Current medicines are reviewed at length with the patient today.  Concerns regarding medicines are outlined above.  Medication changes, Labs and Tests ordered today are listed in the Patient Instructions below. Patient Instructions  Medication Instructions:  Your physician recommends that you continue on your current medications as directed. Please refer to the Current Medication list given to you today.  *If you need a refill on your cardiac medications before your next appointment, please call your pharmacy*   Lab Work: None If you have labs (blood work) drawn today and your tests are completely normal, you will receive your results only by: MyChart Message (if you have MyChart) OR A paper copy in the mail If you have any lab test that is abnormal or we need to change your treatment, we will call you to review the results.   Follow-Up: At [facility], you and your health needs are our priority.  As part of our continuing mission to provide you with exceptional heart care, we have created designated Provider Care Teams.  These Care Teams include your primary Cardiologist (physician) and Advanced Practice Providers (APPs -  Physician Assistants and Nurse Practitioners) who all work together to provide you with the care you need, when you need it.  We recommend signing up for the patient portal called "MyChart".  Sign up information is provided on this After Visit Summary.  MyChart is used to connect with patients for Virtual Visits  (Telemedicine).  Patients are able to view  lab/test results, encounter notes, upcoming appointments, etc.  Non-urgent messages can be sent to your provider as well.   To learn more about what you can do with MyChart, go to [email or web address].    Your next appointment:   6 month(s)  The format for your next appointment:   In Person  Provider:   You may see Verne Carrow, MD or one of the following Advanced Practice Providers on your designated Care Team:   Ronie Spies, PA-C Jacolyn Reedy, PA-C     Signed, Jacolyn Reedy, PA-C  12/08/2020 10:11 AM    [facility] [address] [place], [place], [place]  [address] Phone: [phone]; Fax: [phone]

## 2020-12-08 ENCOUNTER — Encounter: Payer: Self-pay | Admitting: Physician Assistant

## 2020-12-08 ENCOUNTER — Ambulatory Visit: Payer: Medicare PPO | Admitting: Physician Assistant

## 2020-12-08 ENCOUNTER — Other Ambulatory Visit: Payer: Self-pay

## 2020-12-08 VITALS — BP 130/70 | HR 84 | Ht 60.0 in | Wt 218.0 lb

## 2020-12-08 DIAGNOSIS — I10 Essential (primary) hypertension: Secondary | ICD-10-CM

## 2020-12-08 DIAGNOSIS — E785 Hyperlipidemia, unspecified: Secondary | ICD-10-CM

## 2020-12-08 DIAGNOSIS — I480 Paroxysmal atrial fibrillation: Secondary | ICD-10-CM

## 2020-12-08 DIAGNOSIS — I351 Nonrheumatic aortic (valve) insufficiency: Secondary | ICD-10-CM

## 2020-12-08 NOTE — Patient Instructions (Signed)
Medication Instructions:  Your physician recommends that you continue on your current medications as directed. Please refer to the Current Medication list given to you today.  *If you need a refill on your cardiac medications before your next appointment, please call your pharmacy*   Lab Work: None If you have labs (blood work) drawn today and your tests are completely normal, you will receive your results only by: MyChart Message (if you have MyChart) OR A paper copy in the mail If you have any lab test that is abnormal or we need to change your treatment, we will call you to review the results.   Follow-Up: At [facility], you and your health needs are our priority.  As part of our continuing mission to provide you with exceptional heart care, we have created designated Provider Care Teams.  These Care Teams include your primary Cardiologist (physician) and Advanced Practice Providers (APPs -  Physician Assistants and Nurse Practitioners) who all work together to provide you with the care you need, when you need it.  We recommend signing up for the patient portal called "MyChart".  Sign up information is provided on this After Visit Summary.  MyChart is used to connect with patients for Virtual Visits (Telemedicine).  Patients are able to view lab/test results, encounter notes, upcoming appointments, etc.  Non-urgent messages can be sent to your provider as well.   To learn more about what you can do with MyChart, go to [email or web address].    Your next appointment:   6 month(s)  The format for your next appointment:   In Person  Provider:   You may see Christopher McAlhany, MD or one of the following Advanced Practice Providers on your designated Care Team:   Dayna Dunn, PA-C Michele Lenze, PA-C   

## 2021-01-21 ENCOUNTER — Other Ambulatory Visit: Payer: Self-pay | Admitting: Internal Medicine

## 2021-01-21 DIAGNOSIS — G4700 Insomnia, unspecified: Secondary | ICD-10-CM

## 2021-01-21 MED ORDER — ALPRAZOLAM 0.5 MG PO TABS: ORAL_TABLET | ORAL | 0 refills | Status: DC

## 2021-02-23 ENCOUNTER — Other Ambulatory Visit: Payer: Self-pay | Admitting: Internal Medicine

## 2021-03-14 ENCOUNTER — Other Ambulatory Visit: Payer: Self-pay | Admitting: Internal Medicine

## 2021-03-14 DIAGNOSIS — G4700 Insomnia, unspecified: Secondary | ICD-10-CM

## 2021-03-14 MED ORDER — ALPRAZOLAM 0.5 MG PO TABS: ORAL_TABLET | ORAL | 0 refills | Status: DC

## 2021-05-02 NOTE — Progress Notes (Unsigned)
Patient ID: Deanna Gomez, female   DOB: 13-Oct-1945, 76 y.o.   MRN: [id number]   CPE  Assessment:   CPE  1 year  Atherosclerosis of aorta Per CT 2018 Control blood pressure, cholesterol, glucose, increase exercise.   Diastolic CHF Grade 2 dysfunction, Weight stable/down, appears euvolemic Followed by cardiology  Essential hypertension - continue medications, DASH diet, exercise and monitor at home. Call if greater than 130/80.   Hepatitis C virus infection without hepatic coma, unspecified chronicity S/p treatment, in remission; monitor LFTs at routine OVs  Hepatic steatosis Weight loss advised, low processed carb diet, avoid alcohol/tylenol, will monitor LFTs  Hyperlipidemia -continue medications, check lipids, decrease fatty foods, increase activity.   Other abnormal glucose (hx of prediabetes) Discussed general issues about diabetes pathophysiology and management., Educational material distributed., Suggested low cholesterol diet., Encouraged aerobic exercise., Discussed foot care., Reminded to get yearly retinal exam.  Morbid obesity -  Long discussion about weight loss, diet, and exercise Recommended diet heavy in fruits and veggies and low in animal meats, cheeses, and dairy products, appropriate calorie intake Patient will work on getting back on metabolism regulation diet and start working Discussed appropriate weight for height and initial goal (200lb) Follow up at next visit  Vitamin D deficiency At goal at recent check; continue to recommend supplementation for goal of 60-100 Defer vitamin D level  Chronic midline back pain Hx of severe scoliosis with fusion 10+ years ago, sees a provider in [place].  Insomnia Uses xanax PRN good sleep hygiene discussed, increase day time activity, try melatonin or benadryl   History of colon polyps Follow up was due August 2021, placed referral to Dr. Watt Climes per patient preference  Osteopenia Last improved,  get next DEXA 12/2021, continue Vit D and Ca, weight bearing exercises  R ear cerumen impaction - stop using Qtips, irrigation used in the office without complications, use OTC drops/oil at home to prevent reoccurence  Left breast intertrigo Previously well controlled by nystatin but ran out; nystatin refilled Reviewed hygiene; dry under breast well after showering, cotton undergarments Follow up if not improving   No orders of the defined types were placed in this encounter.    Future Appointments  Date Time Provider [place]  05/03/2021  3:00 PM Magda Bernheim, NP [facility] None  07/22/2021 11:00 AM Liane Comber, NP [facility] None  05/03/2022  3:00 PM Magda Bernheim, NP [facility] None     Plan:   During the course of the visit the patient was educated and counseled about appropriate screening and preventive services including:   Pneumococcal vaccine  Influenza vaccine Td vaccine Screening electrocardiogram Bone densitometry screening Colorectal cancer screening Diabetes screening Glaucoma screening Nutrition counseling  Advanced directives: requested   Subjective:   Deanna Gomez is a 76 y.o. female who presents for CPE. She has Essential hypertension; Hyperlipidemia, mixed; Abnormal glucose; Vitamin D deficiency; History of hepatitis C; Morbid obesity ([place]); Hepatic steatosis; Abdominal aortic atherosclerosis ([place]); Diastolic CHF ([place]); Insomnia; Constipation; History of colon polyps; Osteopenia; B12 deficiency; Persistent proteinuria; Syncope; AKI (acute kidney injury) ([place]); and Fall at home, initial encounter on their problem list.  She is widowed. She has 1 biological child, 4 step children, 8 grandchildren and 1 great grandchild. She is a retired Event organiser.   Her husband of 20 years passed 01/04/2020 due to advanced cardiac disease. Now living by herself, feels she is doing well. Did get established with grief counselor, will  continue to follow.  Doesn't want daily med. She has xanax 0.25-0.5 mg which she takes only if unable to fall asleep after waking up in the middle of the night or if very anxious due to storm. Typically takes 0.25 mg, 3-4 days per week.   Significant history include Chronic LBP from severe thoracolumbar scoliosis s/p DDD fusion surgery x 3. Established with local provider and benefits from PT intermittently. She has continued with home exercises since covid 19.  She also underwent curative treatment for Hepatitis C (1987).   BMI is There is no height or weight on file to calculate BMI., she was going to PT but stopped with husband illness, plans to restart, stretching/strenght training at home daily, is working on diet/portions, Weight goal <200 lb.  Wt Readings from Last 3 Encounters:  12/08/20 218 lb (98.9 kg)  11/16/20 212 lb (96.2 kg)  11/04/20 209 lb 11.2 oz (95.1 kg)   She has had elevated blood pressure since 1996. Her blood pressure has been controlled at home, reports somewhat labile & today their BP is   . She denies dizziness, CP, dyspnea, edema.   She does workout. She denies chest pain, shortness of breath, dizziness.  She has aortic atherosclerosis per CT 2018.  She is following with cardiology after ECHO in 2019 ordered due to exertional dyspnea demonstrated grade 2 diastolic dysfunction; BP and symptoms improved recently.   She denies dyspnea on exertion, orthopnea, paroxysmal nocturnal dyspnea and edema. Positive for none.  She is on cholesterol medication (rosuvastatin 40 mg daily, admits doesn't take regularly, perhaps 2-3 days per week) and denies myalgias. Her cholesterol is not at goal. The cholesterol last visit was:  Lab Results  Component Value Date   CHOL 235 (H) 07/22/2020   HDL 102 07/22/2020   LDLCALC 111 (H) 07/22/2020   TRIG 111 07/22/2020   CHOLHDL 2.3 07/22/2020   She had prediabetes in 2012. She has been working on diet and exercise for glucose management,  and denies foot ulcerations, hyperglycemia, hypoglycemia , increased appetite, nausea, paresthesia of the feet, polydipsia, polyuria, visual disturbances, vomiting and weight loss. Last A1C in the office was:  Lab Results  Component Value Date   HGBA1C 5.7 (H) 11/05/2020   basecline CKD II, denies NSAID use, fluid intake varies, typically about 3 bottles/week.  Last GFR:  Lab Results  Component Value Date   GFRNONAA 55 (L) 11/07/2020   GFRNONAA 57 (L) 11/06/2020   GFRNONAA 52 (L) 11/06/2020   Patient is on Vitamin D supplement  Lab Results  Component Value Date   VD25OH 42 05/03/2020        Medication Review: Current Outpatient Medications on File Prior to Visit  Medication Sig   ALPRAZolam (XANAX) 0.5 MG tablet Take  1/2 - 1 tablet  1 to 2  x /day  ONLY  if needed for Anxiety Attack or sleep  &  limit to 5 days /week to avoid Addiction & Dementia   apixaban (ELIQUIS) 5 MG TABS tablet Take 1 tablet (5 mg total) by mouth 2 (two) times daily.   atenolol (TENORMIN) 100 MG tablet Take 1 tablet (100 mg total) by mouth daily.   Cholecalciferol (VITAMIN D3) 50 MCG (2000 UT) capsule Take 2,000 Units by mouth daily.   dapagliflozin propanediol (FARXIGA) 10 MG TABS tablet Take 1 tablet by mouth daily. For kidney protection.   hydrochlorothiazide (MICROZIDE) 12.5 MG capsule Take 1 capsule (12.5 mg total) by mouth daily.   losartan (COZAAR) 50 MG tablet Take 1  tablet (50 mg total) by mouth daily.   nystatin (NYSTATIN) powder Apply daily after a shower as needed   Polyethyl Glycol-Propyl Glycol (SYSTANE) 0.4-0.3 % SOLN Apply 1 drop to eye 3 (three) times daily as needed (dry eyes).   rosuvastatin (CRESTOR) 40 MG tablet Take  1 tablet  Daily  for Cholesterol   senna (SENOKOT) 8.6 MG TABS tablet Take 1 tablet (8.6 mg total) by mouth daily as needed for mild constipation.   No current facility-administered medications on file prior to visit.    Current Problems (verified) Patient Active  Problem List   Diagnosis Date Noted   Syncope 11/04/2020   AKI (acute kidney injury) ([place]) 11/04/2020   Fall at home, initial encounter 11/04/2020   Persistent proteinuria 05/07/2020   B12 deficiency 05/04/2020   History of colon polyps 07/17/2019   Osteopenia 07/17/2019   Constipation 01/06/2019   Insomnia [id number]   Diastolic CHF ([place]) [id number]   Hepatic steatosis 12/26/2016   Abdominal aortic atherosclerosis ([place]) 12/26/2016   Morbid obesity ([place]) 10/15/2014   Essential hypertension 03/09/2013   Hyperlipidemia, mixed 03/09/2013   Abnormal glucose 03/09/2013   Vitamin D deficiency 03/09/2013   History of hepatitis C 03/09/2013    Screening Tests Immunization History  Administered Date(s) Administered   DTaP 03/06/2006   Influenza Split 11/20/2013, 12/24/2014   Influenza Whole 12/19/2012   Influenza, High Dose Seasonal PF 01/04/2017, 12/10/2017, 11/08/2018, 12/04/2019   PFIZER(Purple Top)SARS-COV-2 Vaccination 03/27/2019, 04/17/2019, 12/15/2019   Pneumococcal Conjugate-13 05/13/2014   Pneumococcal Polysaccharide-23 03/06/2008, 10/25/2016   Typhoid Live 03/06/2004   Zoster, Live 06/11/2007    Preventative care: Last colonoscopy: 10/2014, due 10/2019, referred to Dr. Watt Climes but had to cancel due to husband MGM 12/08/2019 [facility] DEXA: 2010, 2018, 12/2019 - T -1.5 - improved from previous PAP 11/2014 DONE  Echo 2019 - grade 2 diastolic Stress test 0000000  Tdap: 2008, declines Influenza: 11/2019 Pneumonia: 2018 Prevnar: 2016 Shingles: 2009, not interested in shingrix Covid 19: 3/3, 2021, pfizer - has had booster  Names of Other Physician/Practitioners you currently use: 1. [facility] here for primary care 2. Dr. Jerline Pain, eye doctor, last visit 2021 3. Dr. Judd Lien, Dentist, last visit 2021, goes q86m 4. Dr. Delman Cheadle, derm, last 2021, had to reschedule - will see this year  Patient Care Team: Unk Pinto, MD as PCP - General  (Internal Medicine) Burnell Blanks, MD as PCP - Cardiology (Cardiology) Richmond Campbell, MD as Consulting Physician (Gastroenterology) Delila Pereyra, MD as Consulting Physician (Gynecology) Jari Pigg, MD as Consulting Physician (Dermatology) Chauncey Fischer, DO (Ophthalmology) Rehabilitation, Belinda Block as Physical Therapist (Physical Therapy)   Allergies Allergies  Allergen Reactions   Fentanyl Other (See Comments)    Intolerlance    SURGICAL HISTORY She  has a past surgical history that includes Back surgery (1986); Breast reconstruction; and Cataract extraction. FAMILY HISTORY Her family history includes Coronary artery disease in her brother; Hyperlipidemia in her father; Obesity in her brother. SOCIAL HISTORY She  reports that she quit smoking about 16 years ago. Her smoking use included cigarettes. She has a 30.00 pack-year smoking history. She has never used smokeless tobacco. She reports current alcohol use of about 14.0 standard drinks per week. She reports that she does not use drugs.  Review of Systems  Constitutional:  Negative for malaise/fatigue and weight loss.  HENT:  Negative for hearing loss and tinnitus.   Eyes:  Negative for blurred vision and double vision.  Respiratory:  Negative for  cough, sputum production, shortness of breath and wheezing.   Cardiovascular:  Negative for chest pain, palpitations, orthopnea, claudication, leg swelling and PND.  Gastrointestinal:  Negative for abdominal pain, blood in stool, constipation, diarrhea, heartburn, melena, nausea and vomiting.  Genitourinary: Negative.   Musculoskeletal:  Negative for falls, joint pain and myalgias.  Skin:  Negative for rash (under left breast).  Neurological:  Negative for dizziness, tingling, sensory change, weakness and headaches.  Endo/Heme/Allergies:  Negative for polydipsia.  Psychiatric/Behavioral: Negative.  Negative for depression, memory loss, substance abuse and suicidal  ideas. The patient is not nervous/anxious and does not have insomnia.   All other systems reviewed and are negative.   Objective:     There were no vitals taken for this visit.  General Appearance: Well nourished, in no apparent distress. Eyes: PERRLA, EOMs, conjunctiva no swelling or erythema Sinuses: No Frontal/maxillary tenderness ENT/Mouth: L Ext aud canals clear, TM without erythema, bulging. R canal obstructed by hard wax. No erythema, swelling, or exudate on post pharynx.  Tonsils not swollen or erythematous. Hearing normal.  Neck: Supple, thyroid normal.  Respiratory: Respiratory effort normal, BS equal bilaterally without rales, rhonchi, wheezing or stridor.  Cardio: RRR with no MRGs. Brisk peripheral pulses without edema.  Abdomen: Soft, + BS.  Non tender, no guarding, rebound, masses. She has non-tender easily reduced ventral hernia. Lymphatics: Non tender without lymphadenopathy.  Musculoskeletal: Full ROM, 5/5 strength, Normal gait Skin: Warm, dry without rashes, lesions, ecchymosis.  Neuro: Cranial nerves intact. No cerebellar symptoms.  Psych: Awake and oriented X 3, normal affect, Insight and Judgment appropriate.  Breasts:  breasts appear normal, no suspicious masses, no skin or nipple changes or axillary nodes, excepting symmetrical erythematous rash under left breast fold GU: defer   EKG: NSR, PAC, IRBBB  Magda Bernheim, NP   05/02/2021

## 2021-05-03 ENCOUNTER — Encounter: Payer: Medicare PPO | Admitting: Nurse Practitioner

## 2021-05-03 ENCOUNTER — Encounter: Payer: Medicare PPO | Admitting: Adult Health

## 2021-05-03 DIAGNOSIS — E559 Vitamin D deficiency, unspecified: Secondary | ICD-10-CM

## 2021-05-03 DIAGNOSIS — Z136 Encounter for screening for cardiovascular disorders: Secondary | ICD-10-CM

## 2021-05-03 DIAGNOSIS — I700 Atherosclerosis of aorta: Secondary | ICD-10-CM

## 2021-05-03 DIAGNOSIS — Z1329 Encounter for screening for other suspected endocrine disorder: Secondary | ICD-10-CM

## 2021-05-03 DIAGNOSIS — I5032 Chronic diastolic (congestive) heart failure: Secondary | ICD-10-CM

## 2021-05-03 DIAGNOSIS — Z8619 Personal history of other infectious and parasitic diseases: Secondary | ICD-10-CM

## 2021-05-03 DIAGNOSIS — M545 Low back pain, unspecified: Secondary | ICD-10-CM

## 2021-05-03 DIAGNOSIS — G4700 Insomnia, unspecified: Secondary | ICD-10-CM

## 2021-05-03 DIAGNOSIS — K760 Fatty (change of) liver, not elsewhere classified: Secondary | ICD-10-CM

## 2021-05-03 DIAGNOSIS — R7309 Other abnormal glucose: Secondary | ICD-10-CM

## 2021-05-03 DIAGNOSIS — Z1389 Encounter for screening for other disorder: Secondary | ICD-10-CM

## 2021-05-03 DIAGNOSIS — E782 Mixed hyperlipidemia: Secondary | ICD-10-CM

## 2021-05-03 DIAGNOSIS — I10 Essential (primary) hypertension: Secondary | ICD-10-CM

## 2021-05-03 DIAGNOSIS — Z86010 Personal history of colonic polyps: Secondary | ICD-10-CM

## 2021-05-03 DIAGNOSIS — Z79899 Other long term (current) drug therapy: Secondary | ICD-10-CM

## 2021-05-03 DIAGNOSIS — Z0000 Encounter for general adult medical examination without abnormal findings: Secondary | ICD-10-CM

## 2021-05-03 DIAGNOSIS — M8580 Other specified disorders of bone density and structure, unspecified site: Secondary | ICD-10-CM

## 2021-05-18 NOTE — Progress Notes (Deleted)
?Patient ID: Deanna Gomez, female   DOB: 05/01/1945, 76 y.o.   MRN: [id number] ? ? ?CPE ? ?Assessment:  ? ?CPE  ?1 year ? ?Atherosclerosis of aorta ?Per CT 2018 ?Control blood pressure, cholesterol, glucose, increase exercise.  ? ?Diastolic CHF ?Grade 2 dysfunction, ?Weight stable/down, appears euvolemic ?Followed by cardiology ? ?Essential hypertension ?- continue medications, DASH diet, exercise and monitor at home. Call if greater than 130/80.  ? ?Hepatitis C virus infection without hepatic coma, unspecified chronicity ?S/p treatment, in remission; monitor LFTs at routine OVs ? ?Hepatic steatosis ?Weight loss advised, low processed carb diet, avoid alcohol/tylenol, will monitor LFTs ? ?Hyperlipidemia ?-continue medications, check lipids, decrease fatty foods, increase activity.  ? ?Other abnormal glucose (hx of prediabetes) ?Discussed general issues about diabetes pathophysiology and management., Educational material distributed., Suggested low cholesterol diet., Encouraged aerobic exercise., Discussed foot care., Reminded to get yearly retinal exam. ? ?Morbid obesity -  ?Long discussion about weight loss, diet, and exercise ?Recommended diet heavy in fruits and veggies and low in animal meats, cheeses, and dairy products, appropriate calorie intake ?Patient will work on getting back on metabolism regulation diet and start working ?Discussed appropriate weight for height and initial goal (200lb) ?Follow up at next visit ? ?Vitamin D deficiency ?At goal at recent check; continue to recommend supplementation for goal of 60-100 ?Defer vitamin D level ? ?Chronic midline back pain ?Hx of severe scoliosis with fusion 10+ years ago, sees a provider in [place]. ? ?Insomnia ?Uses xanax PRN ?good sleep hygiene discussed, increase day time activity, try melatonin or benadryl  ? ?History of colon polyps ?Follow up was due August 2021, placed referral to Dr. Watt Climes per patient preference ? ?Osteopenia ?Last improved,  get next DEXA 12/2021, continue Vit D and Ca, weight bearing exercises ? ?R ear cerumen impaction ?- stop using Qtips, irrigation used in the office without complications, use OTC drops/oil at home to prevent reoccurence ? ?Left breast intertrigo ?Previously well controlled by nystatin but ran out; nystatin refilled ?Reviewed hygiene; dry under breast well after showering, cotton undergarments ?Follow up if not improving  ? ?No orders of the defined types were placed in this encounter. ? ? ? ?Future Appointments  ?Date Time Provider [place]  ?05/19/2021  2:00 PM Afsana Liera, [place], NP [facility] None  ?07/22/2021 11:00 AM Liane Comber, NP [facility] None  ? ? ? ?Plan:  ? ?During the course of the visit the patient was educated and counseled about appropriate screening and preventive services including:  ? ?Pneumococcal vaccine  ?Influenza vaccine ?Td vaccine ?Screening electrocardiogram ?Bone densitometry screening ?Colorectal cancer screening ?Diabetes screening ?Glaucoma screening ?Nutrition counseling  ?Advanced directives: requested ? ? ?Subjective:  ? ?Deanna Gomez is a 76 y.o. female who presents for CPE. She has Essential hypertension; Hyperlipidemia, mixed; Abnormal glucose; Vitamin D deficiency; History of hepatitis C; Morbid obesity ([place]); Hepatic steatosis; Abdominal aortic atherosclerosis ([place]); Diastolic CHF ([place]); Insomnia; Constipation; History of colon polyps; Osteopenia; B12 deficiency; Persistent proteinuria; Syncope; AKI (acute kidney injury) ([place]); and Fall at home, initial encounter on their problem list. ? ?She is widowed. She has 1 biological child, 4 step children, 8 grandchildren and 1 great grandchild. She is a retired Event organiser.  ? ?Her husband of 41 years passed 01/04/2020 due to advanced cardiac disease. Now living by herself, feels she is doing well. Did get established with grief counselor, will continue to follow. Doesn't want daily med. She has xanax  0.25-0.5 mg which she  takes only if unable to fall asleep after waking up in the middle of the night or if very anxious due to storm. Typically takes 0.25 mg, 3-4 days per week.  ? ?Significant history include Chronic LBP from severe thoracolumbar scoliosis s/p DDD fusion surgery x 3. Established with local provider and benefits from PT intermittently. She has continued with home exercises since covid 19.  She also underwent curative treatment for Hepatitis C (1987).  ? ?BMI is There is no height or weight on file to calculate BMI., she was going to PT but stopped with husband illness, plans to restart, stretching/strenght training at home daily, is working on diet/portions, Weight goal <200 lb.  ?Wt Readings from Last 3 Encounters:  ?12/08/20 218 lb (98.9 kg)  ?11/16/20 212 lb (96.2 kg)  ?11/04/20 209 lb 11.2 oz (95.1 kg)  ? ?She has had elevated blood pressure since 1996. Her blood pressure has been controlled at home, reports somewhat labile & today their BP is   . She denies dizziness, CP, dyspnea, edema.  ? ?She does workout. She denies chest pain, shortness of breath, dizziness.  ?She has aortic atherosclerosis per CT 2018. ? ?She is following with cardiology after ECHO in 2019 ordered due to exertional dyspnea demonstrated grade 2 diastolic dysfunction; BP and symptoms improved recently.  ? ?She denies dyspnea on exertion, orthopnea, paroxysmal nocturnal dyspnea and edema. Positive for none. ? ?She is on cholesterol medication (rosuvastatin 40 mg daily, admits doesn't take regularly, perhaps 2-3 days per week) and denies myalgias. Her cholesterol is not at goal. The cholesterol last visit was:  ?Lab Results  ?Component Value Date  ? CHOL 235 (H) 07/22/2020  ? HDL 102 07/22/2020  ? LDLCALC 111 (H) 07/22/2020  ? TRIG 111 07/22/2020  ? CHOLHDL 2.3 07/22/2020  ? ?She had prediabetes in 2012. She has been working on diet and exercise for glucose management, and denies foot ulcerations, hyperglycemia, hypoglycemia  , increased appetite, nausea, paresthesia of the feet, polydipsia, polyuria, visual disturbances, vomiting and weight loss. Last A1C in the office was:  ?Lab Results  ?Component Value Date  ? HGBA1C 5.7 (H) 11/05/2020  ? ?basecline CKD II, denies NSAID use, fluid intake varies, typically about 3 bottles/week.  ?Last GFR:  ?Lab Results  ?Component Value Date  ? GFRNONAA 55 (L) 11/07/2020  ? GFRNONAA 57 (L) 11/06/2020  ? GFRNONAA 52 (L) 11/06/2020  ? ?Patient is on Vitamin D supplement  ?Lab Results  ?Component Value Date  ? VD25OH 42 05/03/2020  ?   ? ? ? ?Medication Review: ?Current Outpatient Medications on File Prior to Visit  ?Medication Sig  ? ALPRAZolam (XANAX) 0.5 MG tablet Take  1/2 - 1 tablet  1 to 2  x /day  ONLY  if needed for Anxiety Attack or sleep  &  limit to 5 days /week to avoid Addiction & Dementia  ? apixaban (ELIQUIS) 5 MG TABS tablet Take 1 tablet (5 mg total) by mouth 2 (two) times daily.  ? atenolol (TENORMIN) 100 MG tablet Take 1 tablet (100 mg total) by mouth daily.  ? Cholecalciferol (VITAMIN D3) 50 MCG (2000 UT) capsule Take 2,000 Units by mouth daily.  ? dapagliflozin propanediol (FARXIGA) 10 MG TABS tablet Take 1 tablet by mouth daily. For kidney protection.  ? hydrochlorothiazide (MICROZIDE) 12.5 MG capsule Take 1 capsule (12.5 mg total) by mouth daily.  ? losartan (COZAAR) 50 MG tablet Take 1 tablet (50 mg total) by mouth daily.  ? nystatin (NYSTATIN)  powder Apply daily after a shower as needed  ? Polyethyl Glycol-Propyl Glycol (SYSTANE) 0.4-0.3 % SOLN Apply 1 drop to eye 3 (three) times daily as needed (dry eyes).  ? rosuvastatin (CRESTOR) 40 MG tablet Take  1 tablet  Daily  for Cholesterol  ? senna (SENOKOT) 8.6 MG TABS tablet Take 1 tablet (8.6 mg total) by mouth daily as needed for mild constipation.  ? ?No current facility-administered medications on file prior to visit.  ? ? ?Current Problems (verified) ?Patient Active Problem List  ? Diagnosis Date Noted  ? Syncope 11/04/2020  ?  AKI (acute kidney injury) ([place]) 11/04/2020  ? Fall at home, initial encounter 11/04/2020  ? Persistent proteinuria 05/07/2020  ? B12 deficiency 05/04/2020  ? History of colon polyps 07/17/2019  ? Colon [place]

## 2021-05-19 ENCOUNTER — Encounter: Payer: Medicare PPO | Admitting: Nurse Practitioner

## 2021-05-19 DIAGNOSIS — Z0000 Encounter for general adult medical examination without abnormal findings: Secondary | ICD-10-CM

## 2021-05-19 DIAGNOSIS — E559 Vitamin D deficiency, unspecified: Secondary | ICD-10-CM

## 2021-05-19 DIAGNOSIS — I700 Atherosclerosis of aorta: Secondary | ICD-10-CM

## 2021-05-19 DIAGNOSIS — I10 Essential (primary) hypertension: Secondary | ICD-10-CM

## 2021-05-19 DIAGNOSIS — Z8619 Personal history of other infectious and parasitic diseases: Secondary | ICD-10-CM

## 2021-05-19 DIAGNOSIS — Z1389 Encounter for screening for other disorder: Secondary | ICD-10-CM

## 2021-05-19 DIAGNOSIS — I5030 Unspecified diastolic (congestive) heart failure: Secondary | ICD-10-CM

## 2021-05-19 DIAGNOSIS — R7309 Other abnormal glucose: Secondary | ICD-10-CM

## 2021-05-19 DIAGNOSIS — E782 Mixed hyperlipidemia: Secondary | ICD-10-CM

## 2021-05-19 DIAGNOSIS — Z79899 Other long term (current) drug therapy: Secondary | ICD-10-CM

## 2021-05-19 DIAGNOSIS — G4700 Insomnia, unspecified: Secondary | ICD-10-CM

## 2021-05-19 DIAGNOSIS — G8929 Other chronic pain: Secondary | ICD-10-CM

## 2021-05-19 DIAGNOSIS — K760 Fatty (change of) liver, not elsewhere classified: Secondary | ICD-10-CM

## 2021-05-19 DIAGNOSIS — Z1329 Encounter for screening for other suspected endocrine disorder: Secondary | ICD-10-CM

## 2021-05-19 DIAGNOSIS — Z86010 Personal history of colonic polyps: Secondary | ICD-10-CM

## 2021-05-19 DIAGNOSIS — Z136 Encounter for screening for cardiovascular disorders: Secondary | ICD-10-CM

## 2021-05-19 DIAGNOSIS — M8580 Other specified disorders of bone density and structure, unspecified site: Secondary | ICD-10-CM

## 2021-05-31 NOTE — Progress Notes (Deleted)
?Patient ID: Deanna Gomez, female   DOB: October 18, 1945, 76 y.o.   MRN: [id number] ? ? ?CPE ? ?Assessment:  ? ?CPE  ?1 year ? ?Atherosclerosis of aorta ?Per CT 2018 ?Control blood pressure, cholesterol, glucose, increase exercise.  ? ?Diastolic CHF ?Grade 2 dysfunction, ?Weight stable/down, appears euvolemic ?Followed by cardiology ? ?Essential hypertension ?- continue medications, DASH diet, exercise and monitor at home. Call if greater than 130/80.  ?- Go to the ER if any chest pain, shortness of breath, nausea, dizziness, severe HA, changes vision/speech  ?- CBC ? ?Hepatitis C virus infection without hepatic coma, unspecified chronicity ?S/p treatment, in remission; monitor LFTs at routine OVs ? ?Hepatic steatosis ?Weight loss advised, low processed carb diet, avoid alcohol/tylenol, will monitor LFTs ? ?Hyperlipidemia ?-continue medications, check lipids, decrease fatty foods, increase activity.  ?-lipid panel ?- CMP ? ?Other abnormal glucose (hx of prediabetes) ?Discussed general issues about diabetes pathophysiology and management., Educational material distributed., Suggested low cholesterol diet., Encouraged aerobic exercise., Discussed foot care., Reminded to get yearly retinal exam. ?- A1c ? ?Morbid obesity -  ?Long discussion about weight loss, diet, and exercise ?Recommended diet heavy in fruits and veggies and low in animal meats, cheeses, and dairy products, appropriate calorie intake ?Patient will work on getting back on metabolism regulation diet and start working ?Discussed appropriate weight for height and initial goal (200lb) ?Follow up at next visit ? ?Vitamin D deficiency ?At goal at recent check; continue to recommend supplementation for goal of 60-100 ?- Vitamin D level ? ?Chronic midline back pain ?Hx of severe scoliosis with fusion 10+ years ago, sees a provider in [place]. ? ?Insomnia ?Uses xanax PRN ?good sleep hygiene discussed, increase day time activity, try melatonin or benadryl   ? ?History of colon polyps ?Follow up was due August 2021, placed referral to Dr. Watt Climes per patient preference ? ?Osteopenia ?Last improved, get next DEXA 12/2021, continue Vit D and Ca, weight bearing exercises ? ?Medication Management ?- Magnesium ? ?Screening for thyroid disorder ?- TSH ? ?Screening for ischemic heart disease ?- Routine UA with reflex microscopic ?- Microalbumin/creatinine urine ratio ? ?Screening for ischemic heart disease ?- EKG ? ? ? ?No orders of the defined types were placed in this encounter. ? ? ? ?Future Appointments  ?Date Time Provider [place]  ?06/02/2021  9:00 AM Corrie Reder, Townsend Roger, NP [facility] None  ?07/22/2021 11:00 AM Liane Comber, NP [facility] None  ? ? ? ?Plan:  ? ?During the course of the visit the patient was educated and counseled about appropriate screening and preventive services including:  ? ?Pneumococcal vaccine  ?Influenza vaccine ?Td vaccine ?Screening electrocardiogram ?Bone densitometry screening ?Colorectal cancer screening ?Diabetes screening ?Glaucoma screening ?Nutrition counseling  ?Advanced directives: requested ? ? ?Subjective:  ? ?AAJAH UHDE is a 76 y.o. female who presents for CPE. She has Essential hypertension; Hyperlipidemia, mixed; Abnormal glucose; Vitamin D deficiency; History of hepatitis C; Morbid obesity ([place]); Hepatic steatosis; Abdominal aortic atherosclerosis ([place]); Diastolic CHF ([place]); Insomnia; Constipation; History of colon polyps; Osteopenia; B12 deficiency; Persistent proteinuria; Syncope; AKI (acute kidney injury) ([place]); and Fall at home, initial encounter on their problem list. ? ?She is widowed. She has 1 biological child, 4 step children, 8 grandchildren and 1 great grandchild. She is a retired Event organiser.  ? ?Her husband of 37 years passed 01/04/2020 due to advanced cardiac disease. Now living by herself, feels she is doing well. Did get established with grief counselor, will continue to follow.  Doesn't want daily med. She has xanax 0.25-0.5 mg which she takes only if unable to fall asleep after waking up in the middle of the night or if very anxious due to storm. Typically takes 0.25 mg, 3-4 days per week.  ? ?Significant history include Chronic LBP from severe thoracolumbar scoliosis s/p DDD fusion surgery x 3. Established with local provider and benefits from PT intermittently. She has continued with home exercises since covid 19.  She also underwent curative treatment for Hepatitis C (1987).  ? ?BMI is There is no height or weight on file to calculate BMI., she was going to PT but stopped with husband illness, plans to restart, stretching/strenght training at home daily, is working on diet/portions, Weight goal <200 lb.  ?Wt Readings from Last 3 Encounters:  ?12/08/20 218 lb (98.9 kg)  ?11/16/20 212 lb (96.2 kg)  ?11/04/20 209 lb 11.2 oz (95.1 kg)  ? ?She has had elevated blood pressure since 1996. Her blood pressure has been controlled at home, reports somewhat labile & today their BP is   . She denies dizziness, CP, dyspnea, edema.  ? ?She does workout. She denies chest pain, shortness of breath, dizziness.  ?She has aortic atherosclerosis per CT 2018. ? ?She is following with cardiology after ECHO in 2019 ordered due to exertional dyspnea demonstrated grade 2 diastolic dysfunction; BP and symptoms improved recently.  ? ?She denies dyspnea on exertion, orthopnea, paroxysmal nocturnal dyspnea and edema. Positive for none. ? ?She is on cholesterol medication (rosuvastatin 40 mg daily, admits doesn't take regularly, perhaps 2-3 days per week) and denies myalgias. Her cholesterol is not at goal. The cholesterol last visit was:  ?Lab Results  ?Component Value Date  ? CHOL 235 (H) 07/22/2020  ? HDL 102 07/22/2020  ? LDLCALC 111 (H) 07/22/2020  ? TRIG 111 07/22/2020  ? CHOLHDL 2.3 07/22/2020  ? ?She had prediabetes in 2012. She has been working on diet and exercise for glucose management, and denies foot  ulcerations, hyperglycemia, hypoglycemia , increased appetite, nausea, paresthesia of the feet, polydipsia, polyuria, visual disturbances, vomiting and weight loss. Last A1C in the office was:  ?Lab Results  ?Component Value Date  ? HGBA1C 5.7 (H) 11/05/2020  ? ?basecline CKD II, denies NSAID use, fluid intake varies, typically about 3 bottles/week.  ?Last GFR:  ?Lab Results  ?Component Value Date  ? GFRNONAA 55 (L) 11/07/2020  ? GFRNONAA 57 (L) 11/06/2020  ? GFRNONAA 52 (L) 11/06/2020  ? ?Patient is on Vitamin D supplement  ?Lab Results  ?Component Value Date  ? VD25OH 42 05/03/2020  ?   ? ? ? ?Medication Review: ?Current Outpatient Medications on File Prior to Visit  ?Medication Sig  ? ALPRAZolam (XANAX) 0.5 MG tablet Take  1/2 - 1 tablet  1 to 2  x /day  ONLY  if needed for Anxiety Attack or sleep  &  limit to 5 days /week to avoid Addiction & Dementia  ? apixaban (ELIQUIS) 5 MG TABS tablet Take 1 tablet (5 mg total) by mouth 2 (two) times daily.  ? atenolol (TENORMIN) 100 MG tablet Take 1 tablet (100 mg total) by mouth daily.  ? Cholecalciferol (VITAMIN D3) 50 MCG (2000 UT) capsule Take 2,000 Units by mouth daily.  ? dapagliflozin propanediol (FARXIGA) 10 MG TABS tablet Take 1 tablet by mouth daily. For kidney protection.  ? hydrochlorothiazide (MICROZIDE) 12.5 MG capsule Take 1 capsule (12.5 mg total) by mouth daily.  ? losartan (COZAAR) 50 MG tablet Take 1  tablet (50 mg total) by mouth daily.  ? nystatin (NYSTATIN) powder Apply daily after a shower as needed  ? Polyethyl Glycol-Propyl Glycol (SYSTANE) 0.4-0.3 % SOLN Apply 1 drop to eye 3 (three) times daily as needed (dry eyes).  ? rosuvastatin (CRESTOR) 40 MG tablet Take  1 tablet  Daily  for Cholesterol  ? senna (SENOKOT) 8.6 MG TABS tablet Take 1 tablet (8.6 mg total) by mouth daily as needed for mild constipation.  ? ?No current facility-administered medications on file prior to visit.  ? ? ?Current Problems (verified) ?Patient Active Problem List  ?  Diagnosis Date Noted  ? Syncope 11/04/2020  ? AKI (acute kidney injury) ([place]) 11/04/2020  ? Fall at home, initial encounter 11/04/2020  ? Persistent proteinuria 05/07/2020  ? B12 deficiency 05/04/2020  ? History o

## 2021-06-02 ENCOUNTER — Ambulatory Visit (INDEPENDENT_AMBULATORY_CARE_PROVIDER_SITE_OTHER): Payer: Medicare PPO | Admitting: Nurse Practitioner

## 2021-06-02 ENCOUNTER — Encounter: Payer: Medicare PPO | Admitting: Nurse Practitioner

## 2021-06-02 ENCOUNTER — Encounter: Payer: Self-pay | Admitting: Nurse Practitioner

## 2021-06-02 VITALS — BP 112/78 | HR 65 | Temp 97.5°F | Ht 65.0 in | Wt 204.6 lb

## 2021-06-02 DIAGNOSIS — Z1329 Encounter for screening for other suspected endocrine disorder: Secondary | ICD-10-CM

## 2021-06-02 DIAGNOSIS — Z136 Encounter for screening for cardiovascular disorders: Secondary | ICD-10-CM

## 2021-06-02 DIAGNOSIS — R7309 Other abnormal glucose: Secondary | ICD-10-CM

## 2021-06-02 DIAGNOSIS — M8580 Other specified disorders of bone density and structure, unspecified site: Secondary | ICD-10-CM

## 2021-06-02 DIAGNOSIS — Z1389 Encounter for screening for other disorder: Secondary | ICD-10-CM

## 2021-06-02 DIAGNOSIS — G4700 Insomnia, unspecified: Secondary | ICD-10-CM

## 2021-06-02 DIAGNOSIS — Z0000 Encounter for general adult medical examination without abnormal findings: Secondary | ICD-10-CM

## 2021-06-02 DIAGNOSIS — I700 Atherosclerosis of aorta: Secondary | ICD-10-CM

## 2021-06-02 DIAGNOSIS — E782 Mixed hyperlipidemia: Secondary | ICD-10-CM | POA: Diagnosis not present

## 2021-06-02 DIAGNOSIS — I480 Paroxysmal atrial fibrillation: Secondary | ICD-10-CM

## 2021-06-02 DIAGNOSIS — I10 Essential (primary) hypertension: Secondary | ICD-10-CM | POA: Diagnosis not present

## 2021-06-02 DIAGNOSIS — Z86010 Personal history of colonic polyps: Secondary | ICD-10-CM

## 2021-06-02 DIAGNOSIS — M545 Low back pain, unspecified: Secondary | ICD-10-CM

## 2021-06-02 DIAGNOSIS — N182 Chronic kidney disease, stage 2 (mild): Secondary | ICD-10-CM

## 2021-06-02 DIAGNOSIS — Z8619 Personal history of other infectious and parasitic diseases: Secondary | ICD-10-CM

## 2021-06-02 DIAGNOSIS — Z79899 Other long term (current) drug therapy: Secondary | ICD-10-CM

## 2021-06-02 DIAGNOSIS — K760 Fatty (change of) liver, not elsewhere classified: Secondary | ICD-10-CM

## 2021-06-02 DIAGNOSIS — E559 Vitamin D deficiency, unspecified: Secondary | ICD-10-CM | POA: Diagnosis not present

## 2021-06-02 DIAGNOSIS — I5032 Chronic diastolic (congestive) heart failure: Secondary | ICD-10-CM

## 2021-06-02 NOTE — Progress Notes (Signed)
?Patient ID: SRIJA CHRISLEY, female   DOB: 14-Nov-1945, 76 y.o.   MRN: [id number] ? ? ?CPE ? ?Assessment:  ? ?CPE  ?1 year ? ?Paroxysmal Atrial Fibrillation ?Continue medication and follow with cardiology ? ?Atherosclerosis of aorta ?Per CT 2018 ?Control blood pressure, cholesterol, glucose, increase exercise.  ? ?Diastolic CHF ?Grade 2 dysfunction, ?Weight stable/down, appears euvolemic ?Followed by cardiology ? ?Essential hypertension ?- continue medications, DASH diet, exercise and monitor at home. Call if greater than 130/80.  ?- Go to the ER if any chest pain, shortness of breath, nausea, dizziness, severe HA, changes vision/speech  ?- CBC ? ?Hepatitis C virus infection without hepatic coma, unspecified chronicity ?S/p treatment, in remission; monitor LFTs at routine OVs ? ?Hepatic steatosis ?Weight loss advised, low processed carb diet, avoid alcohol/tylenol, will monitor LFTs ? ?Hyperlipidemia ?-continue medications, check lipids, decrease fatty foods, increase activity.  ?-lipid panel ?- CMP ? ?Other abnormal glucose (hx of prediabetes) ?Discussed general issues about diabetes pathophysiology and management., Educational material distributed., Suggested low cholesterol diet., Encouraged aerobic exercise., Discussed foot care., Reminded to get yearly retinal exam. ?- A1c ? ?Morbid obesity -  ?Long discussion about weight loss, diet, and exercise ?Recommended diet heavy in fruits and veggies and low in animal meats, cheeses, and dairy products, appropriate calorie intake ?Patient will work on getting back on metabolism regulation diet and start working ?Discussed appropriate weight for height and initial goal (200lb) ?Follow up at next visit ? ?Vitamin D deficiency ?At goal at recent check; continue to recommend supplementation for goal of 60-100 ?- Vitamin D level ? ?Chronic midline back pain ?Hx of severe scoliosis with fusion 10+ years ago, sees a provider in [place]. ? ?Insomnia ?Uses xanax  PRN ?good sleep hygiene discussed, increase day time activity, try melatonin or benadryl  ? ?History of colon polyps ?Follow up was due August 2021, placed referral to Dr. Ewing Schlein per patient preference ? ?Osteopenia ?Last improved, get next DEXA 12/2021, continue Vit D and Ca, weight bearing exercises ? ?Medication Management ?- Magnesium ? ?Screening for thyroid disorder ?- TSH ? ?Screening for ischemic heart disease ?- Routine UA with reflex microscopic ?- Microalbumin/creatinine urine ratio ? ?Screening for ischemic heart disease ?- EKG ? ? ? ? ? ? ?Future Appointments  ?Date Time Provider Department [facility]  ?07/22/2021 11:00 AM [name], NP [facility] None  ?06/05/2022  9:00 AM Markiya Keefe, Loma Sousa, NP [facility] None  ? ? ? ? ?Subjective:  ? ?HANAE MCKISSICK is a 76 y.o. female who presents for CPE. She has Essential hypertension; Hyperlipidemia, mixed; Abnormal glucose; Vitamin D deficiency; History of hepatitis C; Morbid obesity ([facility]); Hepatic steatosis; Abdominal aortic atherosclerosis ([facility]); Diastolic CHF ([facility]); Insomnia; Constipation; History of colon polyps; Osteopenia; B12 deficiency; Persistent proteinuria; Syncope; AKI (acute kidney injury) ([facility]); and Fall at home, initial encounter on their problem list. ? ?She is widowed. She has 1 biological child, 4 step children, 8 grandchildren and 1 great grandchild. She is a retired Hospital doctor.  ? ?Her husband of 36 years passed 01/04/2020 due to advanced cardiac disease. Now living by herself, feels she is doing well. Did get established with grief counselor, will continue to follow. Doesn't want daily med. She has xanax 0.25-0.5 mg which she takes only if unable to fall asleep after waking up in the middle of the night or if very anxious due to storm. Typically takes 0.25 mg, 3-4 days per week.  ? ?She is being followed by Dr. Clifton James for  paroxysmal atrial fibrillation.  Currently on Atenolol and Eliquis. ? ?Significant history include  Chronic LBP from severe thoracolumbar scoliosis s/p DDD fusion surgery x 3. Established with local provider and benefits from PT intermittently. She has continued with home exercises since covid 19.  She also underwent curative treatment for Hepatitis C (1987).  ? ?BMI is Body mass index is 34.05 kg/m?., she was going to PT but stopped with husband illness, plans to restart, stretching/strenght training at home daily, is working on diet/portions, Weight goal <200 lb.  ?Wt Readings from Last 3 Encounters:  ?06/02/21 204 lb 9.6 oz (92.8 kg)  ?12/08/20 218 lb (98.9 kg)  ?11/16/20 212 lb (96.2 kg)  ? ?She has had elevated blood pressure since 1996. Her blood pressure has been controlled at home, reports somewhat labile & today their BP is BP: 112/78  ?BP Readings from Last 3 Encounters:  ?06/02/21 112/78  ?12/08/20 130/70  ?11/16/20 118/72  ? ?Marland Kitchen She denies dizziness, CP, dyspnea, edema.  ? ?She does workout. She denies chest pain, shortness of breath, dizziness.  ?She has aortic atherosclerosis per CT 2018. ? ?She is following with cardiology after ECHO in 2019 ordered due to exertional dyspnea demonstrated grade 2 diastolic dysfunction; BP and symptoms improved recently.  ? ?She denies dyspnea on exertion, orthopnea, paroxysmal nocturnal dyspnea and edema. Positive for none. ? ?She is on cholesterol medication (rosuvastatin 40 mg daily, admits doesn't take regularly, perhaps 2-3 days per week) and denies myalgias. Her cholesterol is not at goal. The cholesterol last visit was:  ?Lab Results  ?Component Value Date  ? CHOL 235 (H) 07/22/2020  ? HDL 102 07/22/2020  ? LDLCALC 111 (H) 07/22/2020  ? TRIG 111 07/22/2020  ? CHOLHDL 2.3 07/22/2020  ? ?She had prediabetes in 2012. She has been working on diet and exercise for glucose management, and denies foot ulcerations, hyperglycemia, hypoglycemia , increased appetite, nausea, paresthesia of the feet, polydipsia, polyuria, visual disturbances, vomiting and weight loss. Last  A1C in the office was:  ?Lab Results  ?Component Value Date  ? HGBA1C 5.7 (H) 11/05/2020  ? ?basecline CKD II, denies NSAID use, fluid intake varies, typically about 3 bottles/week.  ?Last GFR:  ?Lab Results  ?Component Value Date  ? GFRNONAA 55 (L) 11/07/2020  ? GFRNONAA 57 (L) 11/06/2020  ? GFRNONAA 52 (L) 11/06/2020  ? ?Patient is on Vitamin D supplement  ?Lab Results  ?Component Value Date  ? VD25OH 42 05/03/2020  ?   ? ? ? ?Medication Review: ?Current Outpatient Medications on File Prior to Visit  ?Medication Sig  ? ALPRAZolam (XANAX) 0.5 MG tablet Take  1/2 - 1 tablet  1 to 2  x /day  ONLY  if needed for Anxiety Attack or sleep  &  limit to 5 days /week to avoid Addiction & Dementia  ? atenolol (TENORMIN) 100 MG tablet Take 1 tablet (100 mg total) by mouth daily.  ? Cholecalciferol (VITAMIN D3) 50 MCG (2000 UT) capsule Take 2,000 Units by mouth daily.  ? dapagliflozin propanediol (FARXIGA) 10 MG TABS tablet Take 1 tablet by mouth daily. For kidney protection.  ? hydrochlorothiazide (MICROZIDE) 12.5 MG capsule Take 1 capsule (12.5 mg total) by mouth daily.  ? losartan (COZAAR) 50 MG tablet Take 1 tablet (50 mg total) by mouth daily.  ? nystatin (NYSTATIN) powder Apply daily after a shower as needed  ? Polyethyl Glycol-Propyl Glycol (SYSTANE) 0.4-0.3 % SOLN Apply 1 drop to eye 3 (three) times daily as needed (dry  eyes).  ? rosuvastatin (CRESTOR) 40 MG tablet Take  1 tablet  Daily  for Cholesterol  ? senna (SENOKOT) 8.6 MG TABS tablet Take 1 tablet (8.6 mg total) by mouth daily as needed for mild constipation.  ? apixaban (ELIQUIS) 5 MG TABS tablet Take 1 tablet (5 mg total) by mouth 2 (two) times daily.  ? ?No current facility-administered medications on file prior to visit.  ? ? ?Current Problems (verified) ?Patient Active Problem List  ? Diagnosis Date Noted  ? Syncope 11/04/2020  ? AKI (acute kidney injury) ([place]) 11/04/2020  ? Fall at home, initial encounter 11/04/2020  ? Persistent proteinuria 05/07/2020  ?  B12 deficiency 05/04/2020  ? History of colon polyps 07/17/2019  ? Osteopenia 07/17/2019  ? Constipation 01/06/2019  ? Insomnia 07/04/2018  ? Diastolic CHF ([place]) 123456  ? Hepatic steatosis 12/26/2016  ? Abd

## 2021-06-03 ENCOUNTER — Telehealth: Payer: Self-pay | Admitting: Nurse Practitioner

## 2021-06-03 ENCOUNTER — Other Ambulatory Visit: Payer: Self-pay | Admitting: Nurse Practitioner

## 2021-06-03 DIAGNOSIS — Z1389 Encounter for screening for other disorder: Secondary | ICD-10-CM | POA: Diagnosis not present

## 2021-06-03 DIAGNOSIS — I10 Essential (primary) hypertension: Secondary | ICD-10-CM | POA: Diagnosis not present

## 2021-06-03 DIAGNOSIS — Z1329 Encounter for screening for other suspected endocrine disorder: Secondary | ICD-10-CM | POA: Diagnosis not present

## 2021-06-03 DIAGNOSIS — E559 Vitamin D deficiency, unspecified: Secondary | ICD-10-CM | POA: Diagnosis not present

## 2021-06-03 DIAGNOSIS — R7309 Other abnormal glucose: Secondary | ICD-10-CM | POA: Diagnosis not present

## 2021-06-03 DIAGNOSIS — Z79899 Other long term (current) drug therapy: Secondary | ICD-10-CM | POA: Diagnosis not present

## 2021-06-03 DIAGNOSIS — E782 Mixed hyperlipidemia: Secondary | ICD-10-CM | POA: Diagnosis not present

## 2021-06-03 DIAGNOSIS — G4700 Insomnia, unspecified: Secondary | ICD-10-CM

## 2021-06-03 LAB — CBC WITH DIFFERENTIAL/PLATELET
Absolute Monocytes: 533 cells/uL (ref 200–950)
Basophils Absolute: 52 cells/uL (ref 0–200)
Basophils Relative: 0.6 %
Eosinophils Absolute: 17 cells/uL (ref 15–500)
Eosinophils Relative: 0.2 %
HCT: 46.4 % — ABNORMAL HIGH (ref 35.0–45.0)
Hemoglobin: 15.8 g/dL — ABNORMAL HIGH (ref 11.7–15.5)
Lymphs Abs: 1032 cells/uL (ref 850–3900)
MCH: 34.8 pg — ABNORMAL HIGH (ref 27.0–33.0)
MCHC: 34.1 g/dL (ref 32.0–36.0)
MCV: 102.2 fL — ABNORMAL HIGH (ref 80.0–100.0)
MPV: 10 fL (ref 7.5–12.5)
Monocytes Relative: 6.2 %
Neutro Abs: 6966 cells/uL (ref 1500–7800)
Neutrophils Relative %: 81 %
Platelets: 315 10*3/uL (ref 140–400)
RBC: 4.54 10*6/uL (ref 3.80–5.10)
RDW: 12.7 % (ref 11.0–15.0)
Total Lymphocyte: 12 %
WBC: 8.6 10*3/uL (ref 3.8–10.8)

## 2021-06-03 LAB — LIPID PANEL
Cholesterol: 212 mg/dL — ABNORMAL HIGH (ref <200)
HDL: 98 mg/dL (ref >=50)
LDL Cholesterol (Calc): 93 mg/dL (calc)
Non-HDL Cholesterol (Calc): 114 mg/dL (calc) (ref <130)
Total CHOL/HDL Ratio: 2.2 (calc) (ref <5.0)
Triglycerides: 109 mg/dL (ref <150)

## 2021-06-03 LAB — COMPLETE METABOLIC PANEL WITH GFR
AG Ratio: 1.4 (calc) (ref 1.0–2.5)
ALT: 19 U/L (ref 6–29)
AST: 28 U/L (ref 10–35)
Albumin: 4.2 g/dL (ref 3.6–5.1)
Alkaline phosphatase (APISO): 72 U/L (ref 37–153)
BUN/Creatinine Ratio: 9 (calc) (ref 6–22)
BUN: 9 mg/dL (ref 7–25)
CO2: 27 mmol/L (ref 20–32)
Calcium: 9.9 mg/dL (ref 8.6–10.4)
Chloride: 98 mmol/L (ref 98–110)
Creat: 1.02 mg/dL — ABNORMAL HIGH (ref 0.60–1.00)
Globulin: 2.9 g/dL (calc) (ref 1.9–3.7)
Glucose, Bld: 110 mg/dL — ABNORMAL HIGH (ref 65–99)
Potassium: 4.6 mmol/L (ref 3.5–5.3)
Sodium: 138 mmol/L (ref 135–146)
Total Bilirubin: 0.7 mg/dL (ref 0.2–1.2)
Total Protein: 7.1 g/dL (ref 6.1–8.1)
eGFR: 56 mL/min/{1.73_m2} — ABNORMAL LOW (ref >=60)

## 2021-06-03 LAB — VITAMIN D 25 HYDROXY (VIT D DEFICIENCY, FRACTURES): Vit D, 25-Hydroxy: 41 ng/mL (ref 30–100)

## 2021-06-03 LAB — HEMOGLOBIN A1C
Hgb A1c MFr Bld: 5.2 % of total Hgb (ref <5.7)
Mean Plasma Glucose: 103 mg/dL
eAG (mmol/L): 5.7 mmol/L

## 2021-06-03 LAB — MAGNESIUM: Magnesium: 1.6 mg/dL (ref 1.5–2.5)

## 2021-06-03 LAB — TSH: TSH: 2.06 mIU/L (ref 0.40–4.50)

## 2021-06-03 MED ORDER — ALPRAZOLAM 0.5 MG PO TABS: ORAL_TABLET | ORAL | 0 refills | Status: DC

## 2021-06-03 NOTE — Telephone Encounter (Signed)
Patient is requesting a refill on her Alprazolam. Said when she was in at her appt. With you yesterday one of the nurses took the bottle and she can't remember if they gave it back or not and is now unable to locate it. She states that if there is a night she has trouble sleeping, she will take a half of a pill and she doesn't want to be without it incase she needs it. Requesting refill be sent to Karin Golden at [facility] ?

## 2021-06-03 NOTE — Telephone Encounter (Signed)
No one  took her bottle at the office so it may have been misplaced at her home.  It was over 2 months since she had her prescription filled so I was able to send in another prescription

## 2021-06-04 LAB — URINALYSIS, ROUTINE W REFLEX MICROSCOPIC
Bacteria, UA: NONE SEEN /HPF
Bilirubin Urine: NEGATIVE
Glucose, UA: NEGATIVE
Hgb urine dipstick: NEGATIVE
Hyaline Cast: NONE SEEN /LPF
Ketones, ur: NEGATIVE
Leukocytes,Ua: NEGATIVE
Nitrite: NEGATIVE
RBC / HPF: NONE SEEN /HPF (ref 0–2)
Specific Gravity, Urine: 1.005 (ref 1.001–1.035)
Squamous Epithelial / HPF: NONE SEEN /HPF (ref <=5)
WBC, UA: NONE SEEN /HPF (ref 0–5)
pH: 6 (ref 5.0–8.0)

## 2021-06-04 LAB — MICROALBUMIN / CREATININE URINE RATIO
Creatinine, Urine: 34 mg/dL (ref 20–275)
Microalb Creat Ratio: 259 mcg/mg creat — ABNORMAL HIGH (ref <30)
Microalb, Ur: 8.8 mg/dL

## 2021-06-04 LAB — MICROSCOPIC MESSAGE

## 2021-06-08 ENCOUNTER — Other Ambulatory Visit: Payer: Self-pay | Admitting: Adult Health

## 2021-07-22 ENCOUNTER — Encounter: Payer: Self-pay | Admitting: Nurse Practitioner

## 2021-07-22 ENCOUNTER — Ambulatory Visit (INDEPENDENT_AMBULATORY_CARE_PROVIDER_SITE_OTHER): Payer: Medicare PPO | Admitting: Nurse Practitioner

## 2021-07-22 VITALS — BP 130/80 | HR 86 | Temp 96.4°F | Wt 206.0 lb

## 2021-07-22 DIAGNOSIS — Z8619 Personal history of other infectious and parasitic diseases: Secondary | ICD-10-CM

## 2021-07-22 DIAGNOSIS — R6889 Other general symptoms and signs: Secondary | ICD-10-CM | POA: Diagnosis not present

## 2021-07-22 DIAGNOSIS — Z0000 Encounter for general adult medical examination without abnormal findings: Secondary | ICD-10-CM | POA: Diagnosis not present

## 2021-07-22 DIAGNOSIS — E538 Deficiency of other specified B group vitamins: Secondary | ICD-10-CM

## 2021-07-22 DIAGNOSIS — E782 Mixed hyperlipidemia: Secondary | ICD-10-CM | POA: Diagnosis not present

## 2021-07-22 DIAGNOSIS — I10 Essential (primary) hypertension: Secondary | ICD-10-CM

## 2021-07-22 DIAGNOSIS — Z0001 Encounter for general adult medical examination with abnormal findings: Secondary | ICD-10-CM | POA: Diagnosis not present

## 2021-07-22 DIAGNOSIS — M8580 Other specified disorders of bone density and structure, unspecified site: Secondary | ICD-10-CM

## 2021-07-22 DIAGNOSIS — Z13820 Encounter for screening for osteoporosis: Secondary | ICD-10-CM

## 2021-07-22 DIAGNOSIS — I480 Paroxysmal atrial fibrillation: Secondary | ICD-10-CM

## 2021-07-22 DIAGNOSIS — I5030 Unspecified diastolic (congestive) heart failure: Secondary | ICD-10-CM

## 2021-07-22 DIAGNOSIS — K760 Fatty (change of) liver, not elsewhere classified: Secondary | ICD-10-CM

## 2021-07-22 DIAGNOSIS — E559 Vitamin D deficiency, unspecified: Secondary | ICD-10-CM

## 2021-07-22 DIAGNOSIS — Z1231 Encounter for screening mammogram for malignant neoplasm of breast: Secondary | ICD-10-CM

## 2021-07-22 DIAGNOSIS — Z79899 Other long term (current) drug therapy: Secondary | ICD-10-CM

## 2021-07-22 DIAGNOSIS — Z1211 Encounter for screening for malignant neoplasm of colon: Secondary | ICD-10-CM

## 2021-07-22 DIAGNOSIS — N179 Acute kidney failure, unspecified: Secondary | ICD-10-CM

## 2021-07-22 DIAGNOSIS — I700 Atherosclerosis of aorta: Secondary | ICD-10-CM

## 2021-07-22 NOTE — Progress Notes (Signed)
Patient ID: Deanna Gomez, female   DOB: Jul 29, 1945, 76 y.o.   MRN: [id number]   MEDICARE VISIT AND 3 MONTH FOLLOW UP  Assessment:   1. Encounter for Medicare annual wellness exam Due Annually  2. Abdominal aortic atherosclerosis ([place]) Per CT 2018 Continue Rosuvastatin Control blood pressure Cholesterol Control glucose Increase exercise.   3. Diastolic congestive heart failure, unspecified HF chronicity (HCC) Grade 2 dysfunction, Continue weight management Continue to follow with Cardiology  4. Paroxysmal atrial fibrillation (HCC) Continue Eliquis Continue Atenolol Continue HCTZ  5. AKI (acute kidney injury) ([place]) Continue Farxiga Avoid nephrotoxic agents including NSAIDS, processed meats  6. Morbid obesity (HCC) Down 14 lb in 6 mo Continue weight loss, diet, and exercise Continue diet heavy in fruits and veggies and low in animal meats, cheeses, and dairy products, appropriate calorie intake Continue to monitor.  7. Essential hypertension Continue Atenolol, HCTZ Discussed benefits of DASH diet Continue exercise  Call if greater than 130/80.   8. History of hepatitis C Treatment completed In remission; monitor LFTs at routine Ovs  9. Hepatic steatosis Continue weight loss Continue low processed and low carb diet Avoid alcohol/tylenol, Continue to monitor LFTs  10. Osteopenia, unspecified location Improved. DEXA due 12/2021,  Continue Vit D and Ca,  Discussed weight bearing exercises  11. Hyperlipidemia, mixed Continue Rosuvastatin  Decrease fatty foods Increase activity.   12. Vitamin D deficiency At goal  Continue Vitamin D  13. B12 deficiency Not currently on B12 Instructed to start supplement. Continue to monitor.  14. Medication management Compliant with all medications. All medications discussed in detail All questions and concerns addressed.  15. Encounter for screening mammogram for malignant neoplasm of breast  - MM Digital  Screening; Future  16. Colon cancer screening  - CT VIRTUAL COLONOSCOPY DIAGNOSTIC; Future  17. Osteoporosis screening  - DG Bone Density; Future  Future Appointments  Date Time Provider [place]  06/05/2022  9:00 AM Magda Bernheim, NP [facility] None  12/20/2022 11:00 AM Darrol Jump, NP [facility] None     Plan:   During the course of the visit the patient was educated and counseled about appropriate screening and preventive services including:   Pneumococcal vaccine  Influenza vaccine Td vaccine Screening electrocardiogram Bone densitometry screening Colorectal cancer screening Diabetes screening Glaucoma screening Nutrition counseling  Advanced directives: requested   Subjective:   Deanna Gomez is a 76 y.o. female who presents for AWV and 3 month follow up on htn, hyperlipidemia, weight, glucose, vitamin D. She also underwent curative treatment for Hepatitis C (1987).   Deanna Gomez of 86 years passed 01/04/2020 due to advanced cardiac disease. Now living by herself, feels she is doing well. She continues to have good support with friends.  Is trying to plan trips for vacation soon and figuring out what to do with Deanna husbands ashes.    Significant history include Chronic LBP from severe thoracolumbar scoliosis s/p DDD fusion surgery x 3. Established with local provider and benefits from PT intermittently. Denies recent pain.   BMI is Body mass index is 34.28 kg/m., she has lost 14 lb over the last three months.  She continues to focus on eating a well balanced diet. Wt Readings from Last 3 Encounters:  07/22/21 206 lb (93.4 kg)  06/02/21 204 lb 9.6 oz (92.8 kg)  12/08/20 218 lb (98.9 kg)   She has had elevated blood pressure since 1996. Deanna blood pressure has been controlled at home, reports somewhat labile & today their  BP is BP: 130/80 She denies chest pain, shortness of breath, dizziness.   She has aortic atherosclerosis per CT 2018.  She takes  daily statin.  She is following with cardiology after ECHO in 2019 ordered due to exertional dyspnea demonstrated grade 2 diastolic dysfunction; BP and symptoms improved recently. She takes anticoagulant for atrial fib.  Well controlled with no new events.  She denies dyspnea on exertion, orthopnea, paroxysmal nocturnal dyspnea and edema. Positive for none.  She is on cholesterol medication (rosuvastatin 40 mg daily, was off of med last check) and denies myalgias. Deanna cholesterol is not at goal. The cholesterol last visit was:  Lab Results  Component Value Date   CHOL 212 (H) 06/02/2021   HDL 98 06/02/2021   LDLCALC 93 06/02/2021   TRIG 109 06/02/2021   CHOLHDL 2.2 06/02/2021   She had prediabetes in 2012. She has been working on diet and exercise for glucose management, and denies foot ulcerations, hyperglycemia, hypoglycemia , increased appetite, nausea, paresthesia of the feet, polydipsia, polyuria, visual disturbances, vomiting and weight loss. She is currently on Farxiga.  Last A1C in the office was:  Lab Results  Component Value Date   HGBA1C 5.2 06/02/2021   [place] CKD II, denies NSAID use, fluid intake varies, had proteinuria, was referred to [facility] Thedore Mins at [facility], underwent biopsy 06/2020 which was benign, he added farxiga 10 mg to losartan 100 mg daily. Last GFR:  Lab Results  Component Value Date   GFRNONAA 55 (L) 11/07/2020   GFRNONAA 57 (L) 11/06/2020   GFRNONAA 52 (L) 11/06/2020   Patient is on Vitamin D supplement  Lab Results  Component Value Date   VD25OH 41 06/02/2021     She admits didn't start on supplement, receptive to starting Lab Results  Component Value Date   VITAMINB12 254 05/03/2020      Medication Review: Current Outpatient Medications on File Prior to Visit  Medication Sig   ALPRAZolam (XANAX) 0.5 MG tablet Take  1/2 - 1 tablet  1 to 2  x /day  ONLY  if needed for Anxiety Attack or sleep  &  limit to 5 days /week to avoid Addiction &  Dementia   apixaban (ELIQUIS) 5 MG TABS tablet Take 1 tablet (5 mg total) by mouth 2 (two) times daily.   atenolol (TENORMIN) 100 MG tablet Take 1 tablet (100 mg total) by mouth daily.   Cholecalciferol (VITAMIN D3) 50 MCG (2000 UT) capsule Take 2,000 Units by mouth daily.   dapagliflozin propanediol (FARXIGA) 10 MG TABS tablet Take 1 tablet by mouth daily. For kidney protection.   hydrochlorothiazide (MICROZIDE) 12.5 MG capsule Take 1 capsule (12.5 mg total) by mouth daily.   losartan (COZAAR) 50 MG tablet Take 1 tablet (50 mg total) by mouth daily.   nystatin (NYAMYC) powder APPLY DAILY AFTER A SHOWER AS DIRECTED AS NEEDED   Polyethyl Glycol-Propyl Glycol (SYSTANE) 0.4-0.3 % SOLN Apply 1 drop to eye 3 (three) times daily as needed (dry eyes).   rosuvastatin (CRESTOR) 40 MG tablet Take  1 tablet  Daily  for Cholesterol   senna (SENOKOT) 8.6 MG TABS tablet Take 1 tablet (8.6 mg total) by mouth daily as needed for mild constipation.   No current facility-administered medications on file prior to visit.    Current Problems (verified) Patient Active Problem List   Diagnosis Date Noted   Paroxysmal atrial fibrillation (HCC) 06/02/2021   Syncope 11/04/2020   AKI (acute kidney injury) (HCC) 11/04/2020  Fall at home, initial encounter 11/04/2020   Persistent proteinuria 05/07/2020   B12 deficiency 05/04/2020   History of colon polyps 07/17/2019   Osteopenia 07/17/2019   Constipation 01/06/2019   Insomnia [id number]   Diastolic CHF ([place]) [id number]   Hepatic steatosis 12/26/2016   Abdominal aortic atherosclerosis ([place]) 12/26/2016   Morbid obesity ([place]) 10/15/2014   Essential hypertension 03/09/2013   Hyperlipidemia, mixed 03/09/2013   Abnormal glucose 03/09/2013   Vitamin D deficiency 03/09/2013   History of hepatitis C 03/09/2013    Screening Tests Immunization History  Administered Date(s) Administered   DTaP 03/06/2006   Influenza Split 11/20/2013, 12/24/2014   Influenza Whole  12/19/2012   Influenza, High Dose Seasonal PF 01/04/2017, 12/10/2017, 11/08/2018, 12/04/2019   PFIZER(Purple Top)SARS-COV-2 Vaccination 03/27/2019, 04/17/2019, 12/15/2019   Pneumococcal Conjugate-13 05/13/2014   Pneumococcal Polysaccharide-23 03/06/2008, 10/25/2016   Typhoid Live 03/06/2004   Zoster, Live 06/11/2007    Preventative care: Last colonoscopy: 10/2014, due 10/2019, Due. She will reach out to GI when ready. MGM:  Due DEXA: 2010, 2018, 10/2021Due PAP 11/2014 DONE  Echo 2019 - grade 2 diastolic Stress test 0000000  Tdap: 2008, declines Influenza: 11/2021 Pneumonia: 2018 Prevnar: 2016 Shingles: 2009, not interested in shingrix Covid 19: 3/3, 2021, pfizer   Names of Other Physician/Practitioners you currently use: 1. [facility] here for primary care 2. Dr. Bing Plume, eye doctor, last visit 2022 3. Dr. Regina Eck, Dentist, last visit 2022, goes q68m 4. Dr. Delman Cheadle, derm, last 2021, had to reschedule - will see this year  Patient Care Team: Unk Pinto, MD as PCP - General (Internal Medicine) Burnell Blanks, MD as PCP - Cardiology (Cardiology) Richmond Campbell, MD as Consulting Physician (Gastroenterology) Delila Pereyra, MD as Consulting Physician (Gynecology) Jari Pigg, MD as Consulting Physician (Dermatology) Chauncey Fischer, DO (Ophthalmology) Rehabilitation, Belinda Block as Physical Therapist (Physical Therapy)   Allergies Allergies  Allergen Reactions   Fentanyl Other (See Comments)    Intolerlance    SURGICAL HISTORY She  has a past surgical history that includes Back surgery (1986); Breast reconstruction; and Cataract extraction. FAMILY HISTORY Deanna family history includes Coronary artery disease in Deanna brother; Hyperlipidemia in Deanna father; Obesity in Deanna brother. SOCIAL HISTORY She  reports that she quit smoking about 16 years ago. Deanna smoking use included cigarettes. She has a 30.00 pack-year smoking  history. She has never used smokeless tobacco. She reports current alcohol use of about 14.0 standard drinks per week. She reports that she does not use drugs.  MEDICARE WELLNESS OBJECTIVES: Physical activity: Current Exercise Habits: The patient does not participate in regular exercise at present, Exercise limited by: cardiac condition(s);orthopedic condition(s);respiratory conditions(s) Cardiac risk factors: Cardiac Risk Factors include: advanced age (>49men, >51 women);hypertension;obesity (BMI >30kg/m2) Depression/mood screen:      07/24/2021   10:00 PM  Depression screen PHQ 2/9  Decreased Interest 1  Down, Depressed, Hopeless 1  PHQ - 2 Score 2    ADLs:     07/24/2021   10:01 PM 11/04/2020    3:37 PM  In your present state of health, do you have any difficulty performing the following activities:  Hearing? 0   Vision? 0   Difficulty concentrating or making decisions? 0   Walking or climbing stairs? 0   Dressing or bathing? 0   Doing errands, shopping? 1 0  Comment Has Helper/Friend patient drives  Preparing Food and eating ? N   Using the Toilet? N   In the past six months, have you  accidently leaked urine? N   Do you have problems with loss of bowel control? N   Managing your Medications? N   Managing your Finances? N   Housekeeping or managing your Housekeeping? N      Cognitive Testing  Alert? Yes  Normal Appearance?Yes  Oriented to person? Yes  Place? Yes   Time? Yes  Recall of three objects?  Yes  Can perform simple calculations? Yes  Displays appropriate judgment?Yes  Can read the correct time from a watch face?Yes  EOL planning: Does Patient Have a Medical Advance Directive?: Yes Type of Advance Directive: Living will Does patient want to make changes to medical advance directive?: No - Patient declined      Review of Systems  Constitutional:  Negative for malaise/fatigue and weight loss.  HENT:  Negative for hearing loss and tinnitus.   Eyes:   Negative for blurred vision and double vision.  Respiratory:  Negative for cough, sputum production, shortness of breath and wheezing.   Cardiovascular:  Negative for chest pain, palpitations, orthopnea, claudication, leg swelling and PND.  Gastrointestinal:  Negative for abdominal pain, blood in stool, constipation, diarrhea, heartburn, melena, nausea and vomiting.  Genitourinary: Negative.   Musculoskeletal:  Negative for falls, joint pain and myalgias.  Skin:  Negative for rash.  Neurological:  Negative for dizziness, tingling, sensory change, weakness and headaches.  Endo/Heme/Allergies:  Negative for polydipsia.  Psychiatric/Behavioral:  Negative for depression, memory loss, substance abuse and suicidal ideas. The patient is nervous/anxious (with storms) and has insomnia (occasional).   All other systems reviewed and are negative.   Objective:     BP 130/80   Pulse 86   Temp (!) 96.4 F (35.8 C)   Wt 206 lb (93.4 kg)   SpO2 99%   BMI 34.28 kg/m   General Appearance: Well nourished, in no apparent distress. Eyes: PERRLA, EOMs, conjunctiva no swelling or erythema Sinuses: No Frontal/maxillary tenderness ENT/Mouth: L Ext aud canals clear, TM without erythema, bulging. No erythema, swelling, or exudate on post pharynx.  Tonsils not swollen or erythematous. Hearing normal.  Neck: Supple, thyroid normal.  Respiratory: Respiratory effort normal, BS equal bilaterally without rales, rhonchi, wheezing or stridor.  Cardio: RRR with no MRGs. Brisk peripheral pulses without edema.  Abdomen: Soft, + BS.  Non tender, no guarding, rebound, masses. She has non-tender easily reduced ventral hernia. Lymphatics: Non tender without lymphadenopathy.  Musculoskeletal: Full ROM, 5/5 strength, Normal gait Skin: Warm, dry without rashes, lesions, ecchymosis.  Neuro: Cranial nerves intact. No cerebellar symptoms.  Psych: Awake and oriented X 3, normal affect, Insight and Judgment appropriate.     Medicare Attestation I have personally reviewed: The patient's medical and social history Their use of alcohol, tobacco or illicit drugs Their current medications and supplements The patient's functional ability including ADLs,fall risks, home safety risks, cognitive, and hearing and visual impairment Diet and physical activities Evidence for depression or mood disorders  The patient's weight, height, BMI, and visual acuity have been recorded in the chart.  I have made referrals, counseling, and provided education to the patient based on review of the above and I have provided the patient with a written personalized care plan for preventive services.    Darrol Jump, NP   07/24/2021

## 2021-07-22 NOTE — Patient Instructions (Signed)
INCREASE YOUR B12 INTAKE  Preventive Care 45 Years and Older, Female Preventive care refers to lifestyle choices and visits with your health care provider that can promote health and wellness. Preventive care visits are also called wellness exams. What can I expect for my preventive care visit? Counseling Your health care provider may ask you questions about your: Medical history, including: Past medical problems. Family medical history. Pregnancy and menstrual history. History of falls. Current health, including: Memory and ability to understand (cognition). Emotional well-being. Home life and relationship well-being. Sexual activity and sexual health. Lifestyle, including: Alcohol, nicotine or tobacco, and drug use. Access to firearms. Diet, exercise, and sleep habits. Work and work Statistician. Sunscreen use. Safety issues such as seatbelt and bike helmet use. Physical exam Your health care provider will check your: Height and weight. These may be used to calculate your BMI (body mass index). BMI is a measurement that tells if you are at a healthy weight. Waist circumference. This measures the distance around your waistline. This measurement also tells if you are at a healthy weight and may help predict your risk of certain diseases, such as type 2 diabetes and high blood pressure. Heart rate and blood pressure. Body temperature. Skin for abnormal spots. What immunizations do I need?  Vaccines are usually given at various ages, according to a schedule. Your health care provider will recommend vaccines for you based on your age, medical history, and lifestyle or other factors, such as travel or where you work. What tests do I need? Screening Your health care provider may recommend screening tests for certain conditions. This may include: Lipid and cholesterol levels. Hepatitis C test. Hepatitis B test. HIV (human immunodeficiency virus) test. STI (sexually transmitted  infection) testing, if you are at risk. Lung cancer screening. Colorectal cancer screening. Diabetes screening. This is done by checking your blood sugar (glucose) after you have not eaten for a while (fasting). Mammogram. Talk with your health care provider about how often you should have regular mammograms. BRCA-related cancer screening. This may be done if you have a family history of breast, ovarian, tubal, or peritoneal cancers. Bone density scan. This is done to screen for osteoporosis. Talk with your health care provider about your test results, treatment options, and if necessary, the need for more tests. Follow these instructions at home: Eating and drinking  Eat a diet that includes fresh fruits and vegetables, whole grains, lean protein, and low-fat dairy products. Limit your intake of foods with high amounts of sugar, saturated fats, and salt. Take vitamin and mineral supplements as recommended by your health care provider. Do not drink alcohol if your health care provider tells you not to drink. If you drink alcohol: Limit how much you have to 0-1 drink a day. Know how much alcohol is in your drink. In the [place] one drink equals one 12 oz bottle of beer (355 mL), one 5 oz glass of wine (148 mL), or one 1 oz glass of hard liquor (44 mL). Lifestyle Brush your teeth every morning and night with fluoride toothpaste. Floss one time each day. Exercise for at least 30 minutes 5 or more days each week. Do not use any products that contain nicotine or tobacco. These products include cigarettes, chewing tobacco, and vaping devices, such as e-cigarettes. If you need help quitting, ask your health care provider. Do not use drugs. If you are sexually active, practice safe sex. Use a condom or other form of protection in order to prevent STIs. Take  aspirin only as told by your health care provider. Make sure that you understand how much to take and what form to take. Work with your health care  provider to find out whether it is safe and beneficial for you to take aspirin daily. Ask your health care provider if you need to take a cholesterol-lowering medicine (statin). Find healthy ways to manage stress, such as: Meditation, yoga, or listening to music. Journaling. Talking to a trusted person. Spending time with friends and family. Minimize exposure to UV radiation to reduce your risk of skin cancer. Safety Always wear your seat belt while driving or riding in a vehicle. Do not drive: If you have been drinking alcohol. Do not ride with someone who has been drinking. When you are tired or distracted. While texting. If you have been using any mind-altering substances or drugs. Wear a helmet and other protective equipment during sports activities. If you have firearms in your house, make sure you follow all gun safety procedures. What's next? Visit your health care provider once a year for an annual wellness visit. Ask your health care provider how often you should have your eyes and teeth checked. Stay up to date on all vaccines. This information is not intended to replace advice given to you by your health care provider. Make sure you discuss any questions you have with your health care provider. Document Revised: 08/18/2020 Document Reviewed: 08/18/2020 Elsevier Patient Education  2023 Elsevier Inc.  

## 2021-08-02 DIAGNOSIS — Z1231 Encounter for screening mammogram for malignant neoplasm of breast: Secondary | ICD-10-CM | POA: Diagnosis not present

## 2021-08-02 LAB — HM MAMMOGRAPHY

## 2021-08-03 ENCOUNTER — Encounter: Payer: Self-pay | Admitting: Internal Medicine

## 2021-08-16 ENCOUNTER — Encounter: Payer: Self-pay | Admitting: Internal Medicine

## 2021-08-16 ENCOUNTER — Ambulatory Visit: Payer: Medicare PPO | Admitting: Internal Medicine

## 2021-08-16 VITALS — BP 148/114 | HR 46 | Temp 96.9°F | Resp 16 | Ht 65.5 in | Wt 202.8 lb

## 2021-08-16 DIAGNOSIS — I10 Essential (primary) hypertension: Secondary | ICD-10-CM

## 2021-08-16 DIAGNOSIS — F5102 Adjustment insomnia: Secondary | ICD-10-CM | POA: Diagnosis not present

## 2021-08-16 MED ORDER — TRAZODONE HCL 150 MG PO TABS: ORAL_TABLET | ORAL | 3 refills | Status: DC

## 2021-08-16 NOTE — Progress Notes (Signed)
    Future Appointments  Date Time Provider Department  08/16/2021 11:30 AM Lucky Cowboy, MD [facility]  06/05/2022  9:00 AM Raynelle Dick, NP [facility]  12/20/2022 11:00 AM Adela Glimpse, NP [facility]   History of Present Illness:             This very nice 76 y.o. [place] (widowed Oct 2021) with HTN, cAfib (Eliquis), diastolic HF, CKD 3a (GFR 56) ,  HLD, Pre-Diabetes and Vitamin D Deficiency presents with concerns of  insomnia. Previously used Alprazolam to induce sleep . Widowed about 1.5 + years  ago and admits mild depression related to loss & being alone. Is amenable to try other sophoretic meds.     Medications  FARXIGA  10 MG , Take 1 tablet  daily   atenolol 100 MG tablet, Take 1 tablet  daily.   hydrochlorothiazide  12.5 MG capsule, Take 1 capsule (12.5 mg total) by mouth daily.   losartan 50 MG tablet, Take 1 tablet (50 mg total) by mouth daily.   rosuvastatin 40 MG tablet, Take  1 tablet  Daily  for Cholesterol    apixaban (ELIQUIS) 5 MG TABS tablet, Take 1 tablet (5 mg total) by mouth 2 (two) times daily.       VITAMIN D 2000 u, Take 2,000 Units daily.   nystatin  powder, APPLY DAILY AFTER A SHOWER AS DIRECTED AS NEEDED   SYSTANE , Apply 1 drop to eye 3 times daily as needed (dry eyes).   SENOKOT , Take 1 tablet  daily as needed for mild constipation.  Problem list She has Essential hypertension; Hyperlipidemia, mixed; Abnormal glucose; Vitamin D deficiency; History of hepatitis C; Morbid obesity (HCC); Hepatic steatosis; Abdominal aortic atherosclerosis (HCC); Diastolic CHF (HCC); Insomnia; Constipation; History of colon polyps; Osteopenia; B12 deficiency; Persistent proteinuria; Syncope; AKI (acute kidney injury) (HCC); Fall at home, initial encounter; and Paroxysmal atrial fibrillation (HCC) on their problem list.   Observations/Objective:  There were no vitals taken for this visit.  HEENT - WNL. Neck - supple.  Chest - Clear equal BS. Cor - Nl HS. RRR w/o  sig MGR. PP 1(+). No edema. MS- FROM w/o deformities.  Gait Nl. Neuro -  Nl w/o focal abnormalities.  Assessment and Plan:   1. Essential hypertension   2. Adjustment insomnia  - traZODone 150 MG tablet;  Take 1/2 to 1 tablet   1 to 2 hours   before Bedtime as needed for Sleep   Dispense: 90 tablet; Refill: 3   Follow Up Instructions:        I discussed the assessment and treatment plan with the patient. Discussed med effects & side-effects. The patient was provided an opportunity to ask questions and all were answered. The patient agreed with the plan and demonstrated an understanding of the instructions.       The patient was advised to call back or seek an in-person evaluation if the symptoms worsen or if the condition fails to improve as anticipated.   Marinus Maw, MD

## 2021-08-28 NOTE — Progress Notes (Deleted)
Assessment and Plan:  There are no diagnoses linked to this encounter.    Further disposition pending results of labs. Discussed med's effects and SE's.   Over 30 minutes of exam, counseling, chart review, and critical decision making was performed.   Future Appointments  Date Time Provider Department Center  08/29/2021 10:30 AM Raynelle Dick, NP [facility] None  06/05/2022  9:00 AM Raynelle Dick, NP [facility] None  12/20/2022 11:00 AM Adela Glimpse, NP [facility] None    ------------------------------------------------------------------------------------------------------------------   HPI There were no vitals taken for this visit. 76 y.o.female presents for  Past Medical History:  Diagnosis Date   Abdominal aortic atherosclerosis (HCC) 12/26/2016   Noted incidentally on 12/24/2016 CT   Gastric ulcer 11/19/2014   Hepatic steatosis 12/26/2016   Noted on CT 12/24/2016 - moderate degree without distinct lesions   Hepatitis C    HTN (hypertension)    Hyperlipidemia    Obesity    Pre-diabetes    Scoliosis    Tobacco use    stopped 6 years ago.   Vitamin D deficiency      Allergies  Allergen Reactions   Fentanyl Other (See Comments)    Intolerlance    Current Outpatient Medications on File Prior to Visit  Medication Sig   ALPRAZolam (XANAX) 0.5 MG tablet Take  1/2 - 1 tablet  1 to 2  x /day  ONLY  if needed for Anxiety Attack or sleep  &  limit to 5 days /week to avoid Addiction & Dementia   apixaban (ELIQUIS) 5 MG TABS tablet Take 1 tablet (5 mg total) by mouth 2 (two) times daily.   atenolol (TENORMIN) 100 MG tablet Take 1 tablet (100 mg total) by mouth daily.   Cholecalciferol (VITAMIN D3) 50 MCG (2000 UT) capsule Take 2,000 Units by mouth daily.   dapagliflozin propanediol (FARXIGA) 10 MG TABS tablet Take 1 tablet by mouth daily. For kidney protection.   hydrochlorothiazide (MICROZIDE) 12.5 MG capsule Take 1 capsule (12.5 mg total) by mouth daily.    losartan (COZAAR) 50 MG tablet Take 1 tablet (50 mg total) by mouth daily.   nystatin (NYAMYC) powder APPLY DAILY AFTER A SHOWER AS DIRECTED AS NEEDED   Polyethyl Glycol-Propyl Glycol (SYSTANE) 0.4-0.3 % SOLN Apply 1 drop to eye 3 (three) times daily as needed (dry eyes).   rosuvastatin (CRESTOR) 40 MG tablet Take  1 tablet  Daily  for Cholesterol   senna (SENOKOT) 8.6 MG TABS tablet Take 1 tablet (8.6 mg total) by mouth daily as needed for mild constipation.   traZODone (DESYREL) 150 MG tablet Take 1/2 to 1 tablet   1 to 2 hours   before Bedtime as needed for Sleep   No current facility-administered medications on file prior to visit.    ROS: all negative except above.   Physical Exam:  There were no vitals taken for this visit.  General Appearance: Well nourished, in no apparent distress. Eyes: PERRLA, EOMs, conjunctiva no swelling or erythema Sinuses: No Frontal/maxillary tenderness ENT/Mouth: Ext aud canals clear, TMs without erythema, bulging. No erythema, swelling, or exudate on post pharynx.  Tonsils not swollen or erythematous. Hearing normal.  Neck: Supple, thyroid normal.  Respiratory: Respiratory effort normal, BS equal bilaterally without rales, rhonchi, wheezing or stridor.  Cardio: RRR with no MRGs. Brisk peripheral pulses without edema.  Abdomen: Soft, + BS.  Non tender, no guarding, rebound, hernias, masses. Lymphatics: Non tender without lymphadenopathy.  Musculoskeletal: Full ROM, 5/5 strength, normal gait.  Skin: Warm, dry without rashes, lesions, ecchymosis.  Neuro: Cranial nerves intact. Normal muscle tone, no cerebellar symptoms. Sensation intact.  Psych: Awake and oriented X 3, normal affect, Insight and Judgment appropriate.     Raynelle Dick, NP 11:10 AM [facility]

## 2021-08-29 ENCOUNTER — Ambulatory Visit: Payer: Medicare PPO | Admitting: Nurse Practitioner

## 2021-08-30 NOTE — Progress Notes (Deleted)
Assessment and Plan:  There are no diagnoses linked to this encounter.    Further disposition pending results of labs. Discussed med's effects and SE's.   Over 30 minutes of exam, counseling, chart review, and critical decision making was performed.   Future Appointments  Date Time Provider Department Center  08/31/2021  2:45 PM Raynelle Dick, NP [facility] None  06/05/2022  9:00 AM Raynelle Dick, NP [facility] None  12/20/2022 11:00 AM Adela Glimpse, NP [facility] None    ------------------------------------------------------------------------------------------------------------------   HPI There were no vitals taken for this visit. 76 y.o.female presents for  Past Medical History:  Diagnosis Date   Abdominal aortic atherosclerosis (HCC) 12/26/2016   Noted incidentally on 12/24/2016 CT   Gastric ulcer 11/19/2014   Hepatic steatosis 12/26/2016   Noted on CT 12/24/2016 - moderate degree without distinct lesions   Hepatitis C    HTN (hypertension)    Hyperlipidemia    Obesity    Pre-diabetes    Scoliosis    Tobacco use    stopped 6 years ago.   Vitamin D deficiency      Allergies  Allergen Reactions   Fentanyl Other (See Comments)    Intolerlance    Current Outpatient Medications on File Prior to Visit  Medication Sig   ALPRAZolam (XANAX) 0.5 MG tablet Take  1/2 - 1 tablet  1 to 2  x /day  ONLY  if needed for Anxiety Attack or sleep  &  limit to 5 days /week to avoid Addiction & Dementia   apixaban (ELIQUIS) 5 MG TABS tablet Take 1 tablet (5 mg total) by mouth 2 (two) times daily.   atenolol (TENORMIN) 100 MG tablet Take 1 tablet (100 mg total) by mouth daily.   Cholecalciferol (VITAMIN D3) 50 MCG (2000 UT) capsule Take 2,000 Units by mouth daily.   dapagliflozin propanediol (FARXIGA) 10 MG TABS tablet Take 1 tablet by mouth daily. For kidney protection.   hydrochlorothiazide (MICROZIDE) 12.5 MG capsule Take 1 capsule (12.5 mg total) by mouth daily.    losartan (COZAAR) 50 MG tablet Take 1 tablet (50 mg total) by mouth daily.   nystatin (NYAMYC) powder APPLY DAILY AFTER A SHOWER AS DIRECTED AS NEEDED   Polyethyl Glycol-Propyl Glycol (SYSTANE) 0.4-0.3 % SOLN Apply 1 drop to eye 3 (three) times daily as needed (dry eyes).   rosuvastatin (CRESTOR) 40 MG tablet Take  1 tablet  Daily  for Cholesterol   senna (SENOKOT) 8.6 MG TABS tablet Take 1 tablet (8.6 mg total) by mouth daily as needed for mild constipation.   traZODone (DESYREL) 150 MG tablet Take 1/2 to 1 tablet   1 to 2 hours   before Bedtime as needed for Sleep   No current facility-administered medications on file prior to visit.    ROS: all negative except above.   Physical Exam:  There were no vitals taken for this visit.  General Appearance: Well nourished, in no apparent distress. Eyes: PERRLA, EOMs, conjunctiva no swelling or erythema Sinuses: No Frontal/maxillary tenderness ENT/Mouth: Ext aud canals clear, TMs without erythema, bulging. No erythema, swelling, or exudate on post pharynx.  Tonsils not swollen or erythematous. Hearing normal.  Neck: Supple, thyroid normal.  Respiratory: Respiratory effort normal, BS equal bilaterally without rales, rhonchi, wheezing or stridor.  Cardio: RRR with no MRGs. Brisk peripheral pulses without edema.  Abdomen: Soft, + BS.  Non tender, no guarding, rebound, hernias, masses. Lymphatics: Non tender without lymphadenopathy.  Musculoskeletal: Full ROM, 5/5 strength, normal gait.  Skin: Warm, dry without rashes, lesions, ecchymosis.  Neuro: Cranial nerves intact. Normal muscle tone, no cerebellar symptoms. Sensation intact.  Psych: Awake and oriented X 3, normal affect, Insight and Judgment appropriate.     Raynelle Dick, NP 1:27 PM [facility]

## 2021-08-31 ENCOUNTER — Ambulatory Visit: Payer: Medicare PPO | Admitting: Nurse Practitioner

## 2021-08-31 ENCOUNTER — Encounter: Payer: Self-pay | Admitting: Nurse Practitioner

## 2021-08-31 VITALS — BP 140/82 | HR 134 | Temp 97.3°F | Wt 198.4 lb

## 2021-08-31 DIAGNOSIS — I10 Essential (primary) hypertension: Secondary | ICD-10-CM | POA: Diagnosis not present

## 2021-08-31 DIAGNOSIS — I480 Paroxysmal atrial fibrillation: Secondary | ICD-10-CM | POA: Diagnosis not present

## 2021-08-31 DIAGNOSIS — Z91148 Patient's other noncompliance with medication regimen for other reason: Secondary | ICD-10-CM | POA: Diagnosis not present

## 2021-08-31 DIAGNOSIS — N1831 Chronic kidney disease, stage 3a: Secondary | ICD-10-CM | POA: Diagnosis not present

## 2021-08-31 DIAGNOSIS — R109 Unspecified abdominal pain: Secondary | ICD-10-CM | POA: Diagnosis not present

## 2021-08-31 DIAGNOSIS — R350 Frequency of micturition: Secondary | ICD-10-CM | POA: Diagnosis not present

## 2021-08-31 LAB — COMPLETE METABOLIC PANEL WITH GFR
AG Ratio: 1.4 (calc) (ref 1.0–2.5)
ALT: 16 U/L (ref 6–29)
AST: 24 U/L (ref 10–35)
Albumin: 4.1 g/dL (ref 3.6–5.1)
Alkaline phosphatase (APISO): 73 U/L (ref 37–153)
BUN: 11 mg/dL (ref 7–25)
CO2: 30 mmol/L (ref 20–32)
Calcium: 10.1 mg/dL (ref 8.6–10.4)
Chloride: 94 mmol/L — ABNORMAL LOW (ref 98–110)
Creat: 0.83 mg/dL (ref 0.60–1.00)
Globulin: 3 g/dL (calc) (ref 1.9–3.7)
Glucose, Bld: 86 mg/dL (ref 65–99)
Potassium: 4.5 mmol/L (ref 3.5–5.3)
Sodium: 136 mmol/L (ref 135–146)
Total Bilirubin: 0.8 mg/dL (ref 0.2–1.2)
Total Protein: 7.1 g/dL (ref 6.1–8.1)
eGFR: 72 mL/min/{1.73_m2} (ref >=60)

## 2021-08-31 LAB — CBC WITH DIFFERENTIAL/PLATELET
Absolute Monocytes: 739 cells/uL (ref 200–950)
Basophils Absolute: 45 cells/uL (ref 0–200)
Basophils Relative: 0.4 %
Eosinophils Absolute: 11 cells/uL — ABNORMAL LOW (ref 15–500)
Eosinophils Relative: 0.1 %
HCT: 46.3 % — ABNORMAL HIGH (ref 35.0–45.0)
Hemoglobin: 16.2 g/dL — ABNORMAL HIGH (ref 11.7–15.5)
Lymphs Abs: 1568 cells/uL (ref 850–3900)
MCH: 34.8 pg — ABNORMAL HIGH (ref 27.0–33.0)
MCHC: 35 g/dL (ref 32.0–36.0)
MCV: 99.4 fL (ref 80.0–100.0)
MPV: 9.8 fL (ref 7.5–12.5)
Monocytes Relative: 6.6 %
Neutro Abs: 8837 cells/uL — ABNORMAL HIGH (ref 1500–7800)
Neutrophils Relative %: 78.9 %
Platelets: 348 10*3/uL (ref 140–400)
RBC: 4.66 10*6/uL (ref 3.80–5.10)
RDW: 12.9 % (ref 11.0–15.0)
Total Lymphocyte: 14 %
WBC: 11.2 10*3/uL — ABNORMAL HIGH (ref 3.8–10.8)

## 2021-08-31 NOTE — Progress Notes (Signed)
Assessment and Plan:  Deanna Gomez was seen today for acute visit.  Diagnoses and all orders for this visit:  Noncompliance with medication regimen Stressed the importance of regular use of medication Counseled on risks associated, pt states she will try hard to take pills regularly -     COMPLETE METABOLIC PANEL WITH GFR  Paroxysmal atrial fibrillation (HCC) In atrial fibrillation on EKG today- given Eliquis and atenolol 50 mg here in the office. Counseled on importance of regular medication use, advised on risk of MI, Stroke if does not take medication Go to the ER if any chest pain, shortness of breath, nausea, dizziness, severe HA, changes vision/speech  She is to return to office Friday for repeat EKG -     COMPLETE METABOLIC PANEL WITH GFR - EKG   Essential hypertension - continue medications, DASH diet, exercise and monitor at home. Call if greater than 130/80.   Go to the ER if any chest pain, shortness of breath, nausea, dizziness, severe HA, changes vision/speech  -     CBC with Differential/Platelet -     COMPLETE METABOLIC PANEL WITH GFR  Chronic kidney disease (CKD) stage G3a/A1, moderately decreased glomerular filtration rate (GFR) between 45-59 mL/min/1.73 square meter and albuminuria creatinine ratio less than 30 mg/g (HCC) Counseled on importance of increasing fluid intake, avoid Ibuprofen and aleve Continue Farxiga         Further disposition pending results of labs. Discussed med's effects and SE's.   Over 30 minutes of exam, counseling, chart review, and critical decision making was performed.   Future Appointments  Date Time Provider [place]  06/05/2022  9:00 AM Alycia Rossetti, NP [facility] None  12/20/2022 11:00 AM Darrol Jump, NP [facility] None    ------------------------------------------------------------------------------------------------------------------   HPI BP (!) 152/98   Pulse (!) 134   Temp (!) 97.3 F (36.3 C)   Wt  198 lb 6.4 oz (90 kg)   SpO2 98%   BMI 32.51 kg/m   76 y.o.female presents for feet going numb x several weeks.   She is having numbness of bottom of both feet x several weeks. Has not been taking her BP meds regularly.  Not eating regularly.   Has atrial fibrillation but has not been taking her atenolol or Eliquis on a consistent basis. Denies feeling any palpitations. She is followed by cardiology with last visit 12/2020  BMI is Body mass index is 32.51 kg/m., she has not been working on diet and exercise. Wt Readings from Last 3 Encounters:  08/31/21 198 lb 6.4 oz (90 kg)  08/16/21 202 lb 12.8 oz (92 kg)  07/22/21 206 lb (93.4 kg)   She is prescribed atenolol 100 mg QD, HCTZ 12.5 mg QD and Losartan 50 mg QD- has not been taking her meds regularly. BP Readings from Last 3 Encounters:  08/31/21 (!) 152/98  08/16/21 (!) 148/114  07/22/21 130/80    She is on Farxiga for kidney protection- not taking regularly Lab Results  Component Value Date   EGFR 56 (L) 06/02/2021    Lab Results  Component Value Date   HGBA1C 5.2 06/02/2021     Past Medical History:  Diagnosis Date   Abdominal aortic atherosclerosis ([place]) 12/26/2016   Noted incidentally on 12/24/2016 CT   Gastric ulcer 11/19/2014   Hepatic steatosis 12/26/2016   Noted on CT 12/24/2016 - moderate degree without distinct lesions   Hepatitis C    HTN (hypertension)    Hyperlipidemia    Obesity  Pre-diabetes    Scoliosis    Tobacco use    stopped 6 years ago.   Vitamin D deficiency      Allergies  Allergen Reactions   Fentanyl Other (See Comments)    Intolerlance    Current Outpatient Medications on File Prior to Visit  Medication Sig   atenolol (TENORMIN) 100 MG tablet Take 1 tablet (100 mg total) by mouth daily.   Cholecalciferol (VITAMIN D3) 50 MCG (2000 UT) capsule Take 2,000 Units by mouth daily.   dapagliflozin propanediol (FARXIGA) 10 MG TABS tablet Take 1 tablet by mouth daily. For kidney  protection.   hydrochlorothiazide (MICROZIDE) 12.5 MG capsule Take 1 capsule (12.5 mg total) by mouth daily.   losartan (COZAAR) 50 MG tablet Take 1 tablet (50 mg total) by mouth daily.   nystatin (NYAMYC) powder APPLY DAILY AFTER A SHOWER AS DIRECTED AS NEEDED   Polyethyl Glycol-Propyl Glycol (SYSTANE) 0.4-0.3 % SOLN Apply 1 drop to eye 3 (three) times daily as needed (dry eyes).   rosuvastatin (CRESTOR) 40 MG tablet Take  1 tablet  Daily  for Cholesterol   senna (SENOKOT) 8.6 MG TABS tablet Take 1 tablet (8.6 mg total) by mouth daily as needed for mild constipation.   traZODone (DESYREL) 150 MG tablet Take 1/2 to 1 tablet   1 to 2 hours   before Bedtime as needed for Sleep   ALPRAZolam (XANAX) 0.5 MG tablet Take  1/2 - 1 tablet  1 to 2  x /day  ONLY  if needed for Anxiety Attack or sleep  &  limit to 5 days /week to avoid Addiction & Dementia (Patient not taking: Reported on 08/31/2021)   apixaban (ELIQUIS) 5 MG TABS tablet Take 1 tablet (5 mg total) by mouth 2 (two) times daily. (Patient not taking: Reported on 08/31/2021)   No current facility-administered medications on file prior to visit.    ROS: all negative except above.   Physical Exam:  BP (!) 152/98   Pulse (!) 134   Temp (!) 97.3 F (36.3 C)   Wt 198 lb 6.4 oz (90 kg)   SpO2 98%   BMI 32.51 kg/m   General Appearance: Well nourished, in no apparent distress. Eyes: PERRLA, EOMs, conjunctiva no swelling or erythema Sinuses: No Frontal/maxillary tenderness ENT/Mouth: Ext aud canals clear, TMs without erythema, bulging. No erythema, swelling, or exudate on post pharynx.  Tonsils not swollen or erythematous. Hearing normal.  Neck: Supple, thyroid normal.  Respiratory: Respiratory effort normal, BS equal bilaterally without rales, rhonchi, wheezing or stridor.  Cardio: Irregularly, irregular with fast rate Abdomen: Soft, + BS.  Non tender, no guarding, rebound, hernias, masses. Lymphatics: Non tender without lymphadenopathy.   Musculoskeletal: Full ROM, 5/5 strength, normal gait.  Skin: Warm, dry without rashes, lesions, ecchymosis.  Neuro: Cranial nerves intact. Normal muscle tone, no cerebellar symptoms. Sensation intact.  Psych: Awake and oriented X 3, normal affect, Insight and Judgment appropriate.  EKG:  Atrial Fibrillation, tachycardia  Germaine Shenker Mikki Santee, NP 2:04 PM [facility]

## 2021-09-01 ENCOUNTER — Other Ambulatory Visit: Payer: Self-pay | Admitting: Nurse Practitioner

## 2021-09-01 DIAGNOSIS — D72820 Lymphocytosis (symptomatic): Secondary | ICD-10-CM

## 2021-09-02 ENCOUNTER — Encounter: Payer: Self-pay | Admitting: Nurse Practitioner

## 2021-09-02 ENCOUNTER — Ambulatory Visit (INDEPENDENT_AMBULATORY_CARE_PROVIDER_SITE_OTHER): Payer: Medicare PPO | Admitting: Nurse Practitioner

## 2021-09-02 VITALS — Temp 97.0°F | Wt 198.0 lb

## 2021-09-02 DIAGNOSIS — R300 Dysuria: Secondary | ICD-10-CM

## 2021-09-02 DIAGNOSIS — I480 Paroxysmal atrial fibrillation: Secondary | ICD-10-CM | POA: Diagnosis not present

## 2021-09-02 DIAGNOSIS — R350 Frequency of micturition: Secondary | ICD-10-CM

## 2021-09-02 DIAGNOSIS — Z91148 Patient's other noncompliance with medication regimen for other reason: Secondary | ICD-10-CM | POA: Diagnosis not present

## 2021-09-02 NOTE — Patient Instructions (Addendum)
You will need someone to monitor you until you return to clinic on Monday for another EKG to assess if you heart rhythm has returned to normal.  Having someone monitor you in the home will help identify any falls or passing that could occur while your heart is beating irregularly.  If this occurs your caretaker will need to call 911.  PLEASE TAKE YOUR MEDICATIONS AS DIRECTED  Atrial Flutter  Atrial flutter is a type of abnormal heart rhythm (arrhythmia). The heart has an electrical system that tells it how to beat. In atrial flutter, the signals move rapidly in the top chambers of the heart (the atria). This makes your heart beat very fast. Atrial flutter can come and go, or it can be permanent. The goal of treatment is to prevent blood clots from forming, control your heart rate, or restore your heartbeat to a normal rhythm. If this condition is not treated, it can cause serious problems, such as a weakened heart muscle (cardiomyopathy) or a stroke. What are the causes? This condition is often caused by conditions that damage the heart's electrical system. These include: Heart conditions and heart surgery. These include heart attacks and open-heart surgery. Lung problems, such as COPD or a blood clot in the lung (pulmonary embolism, or PE). Poorly controlled high blood pressure (hypertension). Overactive thyroid (hyperthyroidism). Diabetes. In some cases, the cause of this condition is not known. What increases the risk? You are more likely to develop this condition if: You are an elderly adult. You are a man. You are overweight (obese). You have obstructive sleep apnea. You have a family history of atrial flutter. You have diabetes. You drink a lot of alcohol, especially binge drinking. You use drugs, including cannabis. You smoke. What are the signs or symptoms? Symptoms of this condition include: A feeling that your heart is pounding or racing (palpitations). Shortness of  breath. Chest pain. Feeling dizzy or light-headed. Fainting. Low blood pressure (hypotension). Fatigue. Tiring easily during exercise or activity. In some cases, there are no symptoms. How is this diagnosed? This condition may be diagnosed with: An electrocardiogram (ECG) to check electrical signals of the heart. An ambulatory cardiac monitor to record your heart's activity for a few days. An echocardiogram to create pictures of your heart. A transesophageal echocardiogram (TEE) to create even better pictures of your heart. A stress test to check your blood supply while you exercise. Imaging tests, such as a CT scan or chest X-ray. Blood tests. How is this treated? Treatment depends on underlying conditions and how you feel when you experience atrial flutter. This condition may be treated with: Medicines to prevent blood clots or to treat heart rate or heart rhythm problems. Electrical cardioversion to reset the heart's rhythm. Ablation to remove the heart tissue that sends abnormal signals. Left atrial appendage closure to seal the area where blood clots can form. In some cases, underlying conditions will be treated. Follow these instructions at home: Medicines Take over-the-counter and prescription medicines only as told by your health care provider. Do not take any new medicines without talking to your health care provider. If you are taking blood thinners: Talk with your health care provider before you take any medicines that contain aspirin or NSAIDs, such as ibuprofen. These medicines increase your risk for dangerous bleeding. Take your medicine exactly as told, at the same time every day. Avoid activities that could cause injury or bruising, and follow instructions about how to prevent falls. Wear a medical alert bracelet or  carry a card that lists what medicines you take. Lifestyle Eat heart-healthy foods. Talk with a dietitian to make an eating plan that is right for  you. Do not use any products that contain nicotine or tobacco, such as cigarettes, e-cigarettes, and chewing tobacco. If you need help quitting, ask your health care provider. Do not drink alcohol. Do not use drugs, including cannabis. Lose weight if you are overweight or obese. Exercise regularly as instructed by your health care provider. General instructions Do not use diet pills unless your health care provider approves. Diet pills may make heart problems worse. If you have obstructive sleep apnea, manage your condition as told by your health care provider. Keep all follow-up visits as told by your health care provider. This is important. Contact a health care provider if you: Notice a change in the rate, rhythm, or strength of your heartbeat. Are taking a blood thinner and you notice more bruising. Have a sudden change in weight. Tire more easily when you exercise or do heavy work. Get help right away if you have: Pain or pressure in your chest. Shortness of breath. Fainting. Increasing sweating with no known cause. Side effects of blood thinners, such as blood in your vomit, stool, or urine, or bleeding that cannot stop. Any symptoms of a stroke. "BE FAST" is an easy way to remember the main warning signs of a stroke: B - Balance. Signs are dizziness, sudden trouble walking, or loss of balance. E - Eyes. Signs are trouble seeing or a sudden change in vision. F - Face. Signs are sudden weakness or numbness of the face, or the face or eyelid drooping on one side. A - Arms. Signs are weakness or numbness in an arm. This happens suddenly and usually on one side of the body. S - Speech. Signs are sudden trouble speaking, slurred speech, or trouble understanding what people say. T - Time. Time to call emergency services. Write down what time symptoms started. Other signs of a stroke, such as: A sudden, severe headache with no known cause. Nausea or vomiting. Seizure. These symptoms  may represent a serious problem that is an emergency. Do not wait to see if the symptoms will go away. Get medical help right away. Call your local emergency services (911 in the [place] Do not drive yourself to the hospital. Summary Atrial flutter is an abnormal heart rhythm that can give you symptoms of palpitations, shortness of breath, or fatigue. Atrial flutter is often treated with medicines to keep your heart in a normal rhythm and to prevent a stroke. Get help right away if you cannot catch your breath, or have chest pain or pressure. Get help right away if you have signs or symptoms of a stroke. This information is not intended to replace advice given to you by your health care provider. Make sure you discuss any questions you have with your health care provider. Document Revised: 08/14/2018 Document Reviewed: 08/14/2018 Elsevier Patient Education  2023 [employer].

## 2021-09-02 NOTE — Progress Notes (Signed)
Assessment and Plan:  Deanna Gomez was seen today for a return visit.  Diagnoses and all order for this visit:  1. Urinary frequency Continue to stay well hydrated.  - Urinalysis w microscopic + reflex cultur  2. Dysuria  - Urinalysis w microscopic + reflex cultur  3. Paroxymal Atrial Fibrillation  -EKG reveals atrial flutter. Discussed r/f increase syncopal episode further fatal cardiac arrhythmia.   Patient reached out to friend in office who will come sit/care for her until she can contact her daughter.   Call 911 for any increase in CP, SOB. Or for any increase in stroke like symptoms, including HA, N/V, paralysis, difficulty speaking, trouble walking, confusion, vision changes, CP, heart palpitations, SOB, diaphoresis.   4.  History of medication noncompliance. Reviewed medications at length Medication list printed and high-lighted. Patient provided copy. All medications discussed and reviewed in full. All questions and concerns regarding medications addressed.    Notify office for further evaluation and treatment, questions or concerns if s/s fail to improve. The risks and benefits of my recommendations, as well as other treatment options were discussed with the patient today. Questions were answered.  Further disposition pending results of labs. Discussed med's effects and SE's.    Over 20 minutes of exam, counseling, chart review, and critical decision making was performed.   Future Appointments  Date Time Provider Department Center  12/14/2021 11:00 AM Raynelle Dick, NP [facility] None  06/05/2022  9:00 AM Raynelle Dick, NP [facility] None  12/20/2022 11:00 AM Adela Glimpse, NP [facility] None    ------------------------------------------------------------------------------------------------------------------   HPI Temp (!) 97 F (36.1 C)   Wt 198 lb (89.8 kg)   SpO2 99%   BMI 32.45 kg/m   76 y.o.female who was seen on 08/31/21 for  noncompliance with medications presents for follow up on medication management and updated EKG due to hx of atrial fibrillation.  She continues not to take medications as directed.    Her EKG today reveals atrial flutter.  She denies CP, heart palpitations, dizziness, diaphoresis.    Past Medical History:  Diagnosis Date   Abdominal aortic atherosclerosis (HCC) 12/26/2016   Noted incidentally on 12/24/2016 CT   Gastric ulcer 11/19/2014   Hepatic steatosis 12/26/2016   Noted on CT 12/24/2016 - moderate degree without distinct lesions   Hepatitis C    HTN (hypertension)    Hyperlipidemia    Obesity    Pre-diabetes    Scoliosis    Tobacco use    stopped 6 years ago.   Vitamin D deficiency      Allergies  Allergen Reactions   Fentanyl Other (See Comments)    Intolerlance    Current Outpatient Medications on File Prior to Visit  Medication Sig   ALPRAZolam (XANAX) 0.5 MG tablet Take  1/2 - 1 tablet  1 to 2  x /day  ONLY  if needed for Anxiety Attack or sleep  &  limit to 5 days /week to avoid Addiction & Dementia   apixaban (ELIQUIS) 5 MG TABS tablet Take 1 tablet (5 mg total) by mouth 2 (two) times daily.   atenolol (TENORMIN) 100 MG tablet Take 1 tablet (100 mg total) by mouth daily.   Cholecalciferol (VITAMIN D3) 50 MCG (2000 UT) capsule Take 2,000 Units by mouth daily.   dapagliflozin propanediol (FARXIGA) 10 MG TABS tablet Take 1 tablet by mouth daily. For kidney protection.   hydrochlorothiazide (MICROZIDE) 12.5 MG capsule Take 1 capsule (12.5 mg total) by mouth  daily.   losartan (COZAAR) 50 MG tablet Take 1 tablet (50 mg total) by mouth daily.   nystatin (NYAMYC) powder APPLY DAILY AFTER A SHOWER AS DIRECTED AS NEEDED   Polyethyl Glycol-Propyl Glycol (SYSTANE) 0.4-0.3 % SOLN Apply 1 drop to eye 3 (three) times daily as needed (dry eyes).   rosuvastatin (CRESTOR) 40 MG tablet Take  1 tablet  Daily  for Cholesterol   senna (SENOKOT) 8.6 MG TABS tablet Take 1 tablet (8.6 mg  total) by mouth daily as needed for mild constipation.   traZODone (DESYREL) 150 MG tablet Take 1/2 to 1 tablet   1 to 2 hours   before Bedtime as needed for Sleep   No current facility-administered medications on file prior to visit.    ROS: all negative except what is noted in the HPI.   Physical Exam:  Temp (!) 97 F (36.1 C)   Wt 198 lb (89.8 kg)   SpO2 99%   BMI 32.45 kg/m   General Appearance: NAD.  Awake, conversant and cooperative. Eyes: PERRLA, EOMs intact.  Sclera white.  Conjunctiva without erythema. Sinuses: No frontal/maxillary tenderness.  No nasal discharge. Nares patent.  ENT/Mouth: Ext aud canals clear.  Bilateral TMs w/DOL and without erythema or bulging. Hearing intact.  Posterior pharynx without swelling or exudate.  Tonsils without swelling or erythema.  Neck: Supple.  No masses, nodules or thyromegaly. Respiratory: Effort is regular with non-labored breathing. Breath sounds are equal bilaterally without rales, rhonchi, wheezing or stridor.  Cardio: Arrhythmia. Brisk peripheral pulses without edema.  Abdomen: Active BS in all four quadrants.  Soft and non-tender without guarding, rebound tenderness, hernias or masses. Lymphatics: Non tender without lymphadenopathy.  Musculoskeletal: Full ROM, 5/5 strength, normal ambulation.  No clubbing or cyanosis. Skin: Appropriate color for ethnicity. Warm without rashes, lesions, ecchymosis, ulcers.  Neuro: CN II-XII grossly normal. Normal muscle tone without cerebellar symptoms and intact sensation.   Psych: AO X 3,  appropriate mood and affect, insight and judgment.     Adela Glimpse, NP 12:28 PM [facility]

## 2021-09-03 LAB — URINALYSIS W MICROSCOPIC + REFLEX CULTURE
Bacteria, UA: NONE SEEN /HPF
Bilirubin Urine: NEGATIVE
Hgb urine dipstick: NEGATIVE
Hyaline Cast: NONE SEEN /LPF
Ketones, ur: NEGATIVE
Leukocyte Esterase: NEGATIVE
Nitrites, Initial: NEGATIVE
Specific Gravity, Urine: 1.012 (ref 1.001–1.035)
WBC, UA: NONE SEEN /HPF (ref 0–5)
pH: 6.5 (ref 5.0–8.0)

## 2021-09-03 LAB — NO CULTURE INDICATED

## 2021-09-05 ENCOUNTER — Ambulatory Visit: Payer: Medicare PPO | Admitting: Nurse Practitioner

## 2021-09-05 ENCOUNTER — Encounter: Payer: Self-pay | Admitting: Nurse Practitioner

## 2021-09-05 ENCOUNTER — Other Ambulatory Visit: Payer: Self-pay | Admitting: Nurse Practitioner

## 2021-09-05 ENCOUNTER — Telehealth: Payer: Self-pay | Admitting: Nurse Practitioner

## 2021-09-05 VITALS — BP 110/62 | HR 115 | Temp 97.3°F | Wt 199.0 lb

## 2021-09-05 DIAGNOSIS — I480 Paroxysmal atrial fibrillation: Secondary | ICD-10-CM

## 2021-09-05 DIAGNOSIS — G4700 Insomnia, unspecified: Secondary | ICD-10-CM

## 2021-09-05 DIAGNOSIS — I10 Essential (primary) hypertension: Secondary | ICD-10-CM | POA: Diagnosis not present

## 2021-09-05 DIAGNOSIS — Z79899 Other long term (current) drug therapy: Secondary | ICD-10-CM | POA: Diagnosis not present

## 2021-09-05 MED ORDER — ATENOLOL 100 MG PO TABS
100.0000 mg | ORAL_TABLET | Freq: Every day | ORAL | 2 refills | Status: DC
Start: 2021-09-05 — End: 2023-01-08

## 2021-09-05 MED ORDER — ALPRAZOLAM 0.5 MG PO TABS: ORAL_TABLET | ORAL | 0 refills | Status: DC

## 2021-09-05 NOTE — Progress Notes (Signed)
Assessment and Plan:  Deanna Gomez was seen today for a follow up.  Diagnoses and all order for this visit:  1. Paroxysmal atrial fibrillation [facility]) Daughter to contact emergency alert system/medical alert for mother. Daughter to continue to review options for outside assistance/in home care.  Follow up with Cardiology.  - EKG 12-Lead  2. Morbid obesity (HCC) Discussed appropriate BMI Goal of losing 1 lb per month. Diet modification. Physical activity. Encouraged/praised to build confidence.   3. Essential hypertension Controlled with elevated heart rate. Continue medications;  Discussed DASH (Dietary Approaches to Stop Hypertension) DASH diet is lower in sodium than a typical American diet. Cut back on foods that are high in saturated fat, cholesterol, and trans fats. Eat more whole-grain foods, fish, poultry, and nuts Remain active and exercise as tolerated daily.  Monitor BP at home-Call if greater than 130/80.  Stay well hydrated.  4. Medication management Discussed all medications at length with daughter. Discussed importance of medication management along with medication compliance. Daughter to reach out to insurance/pharmacy for pill pack.  Report to ER for any increase in stroke like symptoms, including HA, N/V, paralysis, difficulty speaking, trouble walking, confusion, vision changes, CP, heart palpitations, SOB, diaphoresis.  Notify office for further evaluation and treatment, questions or concerns if s/s fail to improve. The risks and benefits of my recommendations, as well as other treatment options were discussed with the patient today. Questions were answered.  Further disposition pending results of labs. Discussed med's effects and SE's.    Over 40 minutes of exam, counseling, chart review, and critical decision making was performed.   Future Appointments  Date Time Provider Department Center  12/14/2021 11:00 AM Raynelle Dick, NP [facility]  None  06/05/2022  9:00 AM Raynelle Dick, NP [facility] None  12/20/2022 11:00 AM Adela Glimpse, NP [facility] None    ------------------------------------------------------------------------------------------------------------------   HPI BP 110/62   Pulse (!) 115   Temp (!) 97.3 F (36.3 C)   Wt 199 lb (90.3 kg)   SpO2 99%   BMI 32.61 kg/m  76 y.o.female presents today with daughter for re-evaluation of atrial flutter/atrial fibrillation and medication management.    Patient has not been taking medications as directed.  She only took her Atenolol this morning.  She did not take her Eliquis.  When she takes her Eliquis she is only taking once daily.    She was supposed to follow up with Cardiology in 06/2021, but she missed this apt.  Daughter lives out of town [facility]) and has visited her mother over the weekend to assess further needs of mother.  This includes organizing her medications and reaching out to care services.  She is also going to inquire about a medical alert device considering mother is living alone in the home.    Patient is asymptomatic.  Denies chest pain, heart palpitations, difficulty breathing, SOB, DOE, syncopal episodes.    Past Medical History:  Diagnosis Date   Abdominal aortic atherosclerosis (HCC) 12/26/2016   Noted incidentally on 12/24/2016 CT   Gastric ulcer 11/19/2014   Hepatic steatosis 12/26/2016   Noted on CT 12/24/2016 - moderate degree without distinct lesions   Hepatitis C    HTN (hypertension)    Hyperlipidemia    Obesity    Pre-diabetes    Scoliosis    Tobacco use    stopped 6 years ago.   Vitamin D deficiency      Allergies  Allergen Reactions   Fentanyl Other (See Comments)  Intolerlance    Current Outpatient Medications on File Prior to Visit  Medication Sig   ALPRAZolam (XANAX) 0.5 MG tablet Take  1/2 - 1 tablet  1 to 2  x /day  ONLY  if needed for Anxiety Attack or sleep  &  limit to 5 days /week to avoid Addiction  & Dementia   apixaban (ELIQUIS) 5 MG TABS tablet Take 1 tablet (5 mg total) by mouth 2 (two) times daily.   atenolol (TENORMIN) 100 MG tablet Take 1 tablet (100 mg total) by mouth daily.   Cholecalciferol (VITAMIN D3) 50 MCG (2000 UT) capsule Take 2,000 Units by mouth daily.   dapagliflozin propanediol (FARXIGA) 10 MG TABS tablet Take 1 tablet by mouth daily. For kidney protection.   hydrochlorothiazide (MICROZIDE) 12.5 MG capsule Take 1 capsule (12.5 mg total) by mouth daily.   losartan (COZAAR) 50 MG tablet Take 1 tablet (50 mg total) by mouth daily.   nystatin (NYAMYC) powder APPLY DAILY AFTER A SHOWER AS DIRECTED AS NEEDED   Polyethyl Glycol-Propyl Glycol (SYSTANE) 0.4-0.3 % SOLN Apply 1 drop to eye 3 (three) times daily as needed (dry eyes).   rosuvastatin (CRESTOR) 40 MG tablet Take  1 tablet  Daily  for Cholesterol   senna (SENOKOT) 8.6 MG TABS tablet Take 1 tablet (8.6 mg total) by mouth daily as needed for mild constipation.   traZODone (DESYREL) 150 MG tablet Take 1/2 to 1 tablet   1 to 2 hours   before Bedtime as needed for Sleep   No current facility-administered medications on file prior to visit.    ROS: all negative except what is noted in the HPI.   ROS   Physical Exam:  BP 110/62   Pulse (!) 115   Temp (!) 97.3 F (36.3 C)   Wt 199 lb (90.3 kg)   SpO2 99%   BMI 32.61 kg/m   General Appearance: NAD.  Awake, conversant and cooperative. Eyes: PERRLA, EOMs intact.  Sclera white.  Conjunctiva without erythema. Sinuses: No frontal/maxillary tenderness.  No nasal discharge. Nares patent.  ENT/Mouth: Ext aud canals clear.  Bilateral TMs w/DOL and without erythema or bulging. Hearing intact.  Posterior pharynx without swelling or exudate.  Tonsils without swelling or erythema.  Neck: Supple.  No masses, nodules or thyromegaly. Respiratory: Effort is regular with non-labored breathing. Breath sounds are equal bilaterally without rales, rhonchi, wheezing or stridor.   Cardio: RRR with no MRGs. Brisk peripheral pulses without edema.  Abdomen: Active BS in all four quadrants.  Soft and non-tender without guarding, rebound tenderness, hernias or masses. Lymphatics: Non tender without lymphadenopathy.  Musculoskeletal: Full ROM, 5/5 strength, normal ambulation.  No clubbing or cyanosis. Skin: Appropriate color for ethnicity. Warm without rashes, lesions, ecchymosis, ulcers.  Neuro: CN II-XII grossly normal. Normal muscle tone without cerebellar symptoms and intact sensation.   Psych: AO X 3,  appropriate mood and affect, insight and judgment.   EKG:  Atrial Flutter/Atrial Fibrillation    Adela Glimpse, NP 9:35 AM [facility]

## 2021-09-05 NOTE — Telephone Encounter (Signed)
Pts daughter is also requesting for atenolol to be sent in with the Xanax to [facility] in [place]

## 2021-09-07 ENCOUNTER — Telehealth: Payer: Self-pay | Admitting: Nurse Practitioner

## 2021-09-07 NOTE — Telephone Encounter (Signed)
Spoke with patient, states that she can't remember when she had a bowel movement last, thinking it was yesterday. Advised patient to take 1 capful of Miralax every 30 minutes until she has a bowel movement.  ER precautions given.

## 2021-09-07 NOTE — Telephone Encounter (Signed)
Daughter called saying that her mom is very constipated since Friday she has done 2 enemas and taken Miralax, and nothing has worked. Daughter advised her to go to ER in case she had a blockage. But pt is refusing to go anywhere, wanting a nurse to call her and give her advice to see if she would take [place] more seriously.

## 2021-09-19 NOTE — Progress Notes (Deleted)
Office Visit    Patient Name: Deanna Gomez Date of Encounter: 09/19/2021  PCP:  Lucky Cowboy, MD   [facility]  Cardiologist:  Verne Carrow, MD  Advanced Practice Provider:  No care team member to display Electrophysiologist:  None    HPI    Deanna Gomez is a 76 y.o. female with a hx of hypertension, diabetes mellitus type 2, hyperlipidemia, and CKD stage II presents today for follow-up appointment.   Patient was admitted 11/04/2020 with syncope and a fall after an increase in bowel movements due to laxative use.  She was found to be in atrial fibrillation with borderline ventricular rate.  She was chronically on atenolol for hypertension.  She was asymptomatic with atrial fibrillation.  2D echocardiogram with normal LVEF and severe LVH.  She had mild worsening of renal parameters with hypovolemia and severe hypokalemia.  Atrial fibrillation in setting of major electrolyte abnormality but CHA2DS2-VASc over 6 she was placed on Eliquis.  HCTZ was stopped the potassium was corrected.  She was seen in follow-up 12/08/2020 and was feeling good at that time.  Today, she ***  Past Medical History    Past Medical History:  Diagnosis Date   Abdominal aortic atherosclerosis (HCC) 12/26/2016   Noted incidentally on 12/24/2016 CT   Gastric ulcer 11/19/2014   Hepatic steatosis 12/26/2016   Noted on CT 12/24/2016 - moderate degree without distinct lesions   Hepatitis C    HTN (hypertension)    Hyperlipidemia    Obesity    Pre-diabetes    Scoliosis    Tobacco use    stopped 6 years ago.   Vitamin D deficiency    Past Surgical History:  Procedure Laterality Date   BACK SURGERY  1986   x 3   BREAST RECONSTRUCTION     x 2   CATARACT EXTRACTION     Bilateral    Allergies  Allergies  Allergen Reactions   Fentanyl Other (See Comments)    Intolerlance    History of Present Illness    Deanna Gomez is a 76 y.o. female with  a hx of *** last seen ***.   EKGs/Labs/Other Studies Reviewed:   The following studies were reviewed today: ***  EKG:  EKG is *** ordered today.  The ekg ordered today demonstrates ***  Recent Labs: 06/02/2021: Magnesium 1.6; TSH 2.06 08/31/2021: ALT 16; BUN 11; Creat 0.83; Hemoglobin 16.2; Platelets 348; Potassium 4.5; Sodium 136  Recent Lipid Panel    Component Value Date/Time   CHOL 212 (H) 06/02/2021 1009   TRIG 109 06/02/2021 1009   HDL 98 06/02/2021 1009   CHOLHDL 2.2 06/02/2021 1009   VLDL 23 07/06/2016 1200   LDLCALC 93 06/02/2021 1009    Risk Assessment/Calculations:  {Does this patient have ATRIAL FIBRILLATION?:[phone]}  Home Medications   No outpatient medications have been marked as taking for the 09/20/21 encounter (Appointment) with Sharlene Dory, PA-C.     Review of Systems   ***   All other systems reviewed and are otherwise negative except as noted above.  Physical Exam    VS:  There were no vitals taken for this visit. , BMI There is no height or weight on file to calculate BMI.  Wt Readings from Last 3 Encounters:  09/05/21 199 lb (90.3 kg)  09/02/21 198 lb (89.8 kg)  08/31/21 198 lb 6.4 oz (90 kg)     GEN: Well nourished, well developed, in no acute distress.  HEENT: normal. Neck: Supple, no JVD, carotid bruits, or masses. Cardiac: ***RRR, no murmurs, rubs, or gallops. No clubbing, cyanosis, edema.  ***Radials/PT 2+ and equal bilaterally.  Respiratory:  ***Respirations regular and unlabored, clear to auscultation bilaterally. GI: Soft, nontender, nondistended. MS: No deformity or atrophy. Skin: Warm and dry, no rash. Neuro:  Strength and sensation are intact. Psych: Normal affect.  Assessment & Plan    Paroxysmal atrial fibrillation anticoagulated on Eliquis  Hypertension  Hyperlipidemia  Aortic insufficiency     Disposition: Follow up {follow up:15908} with Verne Carrow, MD or APP.  Signed, Sharlene Dory,  PA-C 09/19/2021, 11:17 AM [facility]

## 2021-09-20 ENCOUNTER — Ambulatory Visit: Payer: Medicare PPO | Admitting: Physician Assistant

## 2021-09-20 ENCOUNTER — Telehealth: Payer: Self-pay | Admitting: Nurse Practitioner

## 2021-09-20 DIAGNOSIS — I480 Paroxysmal atrial fibrillation: Secondary | ICD-10-CM

## 2021-09-20 DIAGNOSIS — E785 Hyperlipidemia, unspecified: Secondary | ICD-10-CM

## 2021-09-20 DIAGNOSIS — I10 Essential (primary) hypertension: Secondary | ICD-10-CM

## 2021-09-20 DIAGNOSIS — I351 Nonrheumatic aortic (valve) insufficiency: Secondary | ICD-10-CM

## 2021-09-20 NOTE — Telephone Encounter (Signed)
Patients daughter called seeking advice regarding constipation. Daughter states that Deanna Gomez is unable to remember when she had a bowel movement last. They have tried stool softeners as well as miralax. Should she bring her in for an appt. Or is there other options that she should try first?

## 2021-09-21 NOTE — Telephone Encounter (Signed)
Pt called in saying she wanted something called in for her and I asked if she had tried doing an enema if miralax and other stool softeners hadn't work. She said she would try it but then I saw Tonya's response on wanting her to come in for an OV and send her for an xray. She wasn't willing at first but she did make an appt for this Friday and agree to still try the enema at home.

## 2021-09-23 ENCOUNTER — Ambulatory Visit: Payer: Medicare PPO | Admitting: Nurse Practitioner

## 2021-09-27 ENCOUNTER — Ambulatory Visit: Payer: Medicare PPO | Admitting: Nurse Practitioner

## 2021-09-27 ENCOUNTER — Encounter: Payer: Self-pay | Admitting: Nurse Practitioner

## 2021-09-27 VITALS — BP 120/93 | HR 93 | Temp 96.2°F | Ht 65.0 in | Wt 199.4 lb

## 2021-09-27 DIAGNOSIS — K5901 Slow transit constipation: Secondary | ICD-10-CM

## 2021-09-27 DIAGNOSIS — R1012 Left upper quadrant pain: Secondary | ICD-10-CM | POA: Diagnosis not present

## 2021-09-27 DIAGNOSIS — R140 Abdominal distension (gaseous): Secondary | ICD-10-CM

## 2021-09-27 NOTE — Progress Notes (Signed)
Assessment and Plan:  Deanna Gomez was seen today for an episodic visit.  Diagnoses and all order for this visit:  There are no diagnoses linked to this encounter.   Continue to monitor for any increase in fever, chills, N/V, diarrhea, changes to bowel habits, blood in stool.  Notify office for further evaluation and treatment, questions or concerns if s/s fail to improve. The risks and benefits of my recommendations, as well as other treatment options were discussed with the patient today. Questions were answered.  Further disposition pending results of labs. Discussed med's effects and SE's.    Over *** minutes of exam, counseling, chart review, and critical decision making was performed.   Future Appointments  Date Time Provider Department Center  09/28/2021  2:30 PM Sharlene Dory, [place] [facility]  12/14/2021 11:00 AM Raynelle Dick, NP [facility] None  06/05/2022  9:00 AM Raynelle Dick, NP [facility] None  12/20/2022 11:00 AM Adela Glimpse, NP [facility] None    ------------------------------------------------------------------------------------------------------------------   HPI BP (!) 120/93   Pulse 93   Temp (!) 96.2 F (35.7 C)   Ht 5\' 5"  (1.651 m)   Wt 199 lb 6.4 oz (90.4 kg)   SpO2 99%   BMI 33.18 kg/m  76 y.o.female presents for  Past Medical History:  Diagnosis Date  . Abdominal aortic atherosclerosis (HCC) 12/26/2016   Noted incidentally on 12/24/2016 CT  . Gastric ulcer 11/19/2014  . Hepatic steatosis 12/26/2016   Noted on CT 12/24/2016 - moderate degree without distinct lesions  . Hepatitis C   . HTN (hypertension)   . Hyperlipidemia   . Obesity   . Pre-diabetes   . Scoliosis   . Tobacco use    stopped 6 years ago.  . Vitamin D deficiency      Allergies  Allergen Reactions  . Fentanyl Other (See Comments)    Intolerlance    Current Outpatient Medications on File Prior to Visit  Medication Sig  . ALPRAZolam  (XANAX) 0.5 MG tablet Take  1/2 - 1 tablet  1 to 2  x /day  ONLY  if needed for Anxiety Attack or sleep  &  limit to 5 days /week to avoid Addiction & Dementia  . apixaban (ELIQUIS) 5 MG TABS tablet Take 1 tablet (5 mg total) by mouth 2 (two) times daily.  12/26/2016 atenolol (TENORMIN) 100 MG tablet Take 1 tablet (100 mg total) by mouth daily.  . Cholecalciferol (VITAMIN D3) 50 MCG (2000 UT) capsule Take 2,000 Units by mouth daily.  . dapagliflozin propanediol (FARXIGA) 10 MG TABS tablet Take 1 tablet by mouth daily. For kidney protection.  . hydrochlorothiazide (MICROZIDE) 12.5 MG capsule Take 1 capsule (12.5 mg total) by mouth daily.  Marland Kitchen losartan (COZAAR) 50 MG tablet Take 1 tablet (50 mg total) by mouth daily.  Marland Kitchen nystatin (NYAMYC) powder APPLY DAILY AFTER A SHOWER AS DIRECTED AS NEEDED  . Polyethyl Glycol-Propyl Glycol (SYSTANE) 0.4-0.3 % SOLN Apply 1 drop to eye 3 (three) times daily as needed (dry eyes).  . rosuvastatin (CRESTOR) 40 MG tablet Take  1 tablet  Daily  for Cholesterol  . traZODone (DESYREL) 150 MG tablet Take 1/2 to 1 tablet   1 to 2 hours   before Bedtime as needed for Sleep  . senna (SENOKOT) 8.6 MG TABS tablet Take 1 tablet (8.6 mg total) by mouth daily as needed for mild constipation. (Patient not taking: Reported on 09/27/2021)   No current facility-administered medications on file prior  to visit.    ROS: all negative except what is noted in the HPI.   ROS   Physical Exam:  BP (!) 120/93   Pulse 93   Temp (!) 96.2 F (35.7 C)   Ht 5\' 5"  (1.651 m)   Wt 199 lb 6.4 oz (90.4 kg)   SpO2 99%   BMI 33.18 kg/m   General Appearance: NAD.  Awake, conversant and cooperative. Eyes: PERRLA, EOMs intact.  Sclera white.  Conjunctiva without erythema. Sinuses: No frontal/maxillary tenderness.  No nasal discharge. Nares patent.  ENT/Mouth: Ext aud canals clear.  Bilateral TMs w/DOL and without erythema or bulging. Hearing intact.  Posterior pharynx without swelling or exudate.  Tonsils  without swelling or erythema.  Neck: Supple.  No masses, nodules or thyromegaly. Respiratory: Effort is regular with non-labored breathing. Breath sounds are equal bilaterally without rales, rhonchi, wheezing or stridor.  Cardio: RRR with no MRGs. Brisk peripheral pulses without edema.  Abdomen: Active BS in all four quadrants.  Soft and non-tender without guarding, rebound tenderness, hernias or masses. Lymphatics: Non tender without lymphadenopathy.  Musculoskeletal: Full ROM, 5/5 strength, normal ambulation.  No clubbing or cyanosis. Skin: Appropriate color for ethnicity. Warm without rashes, lesions, ecchymosis, ulcers.  Neuro: CN II-XII grossly normal. Normal muscle tone without cerebellar symptoms and intact sensation.   Psych: AO X 3,  appropriate mood and affect, insight and judgment.     , NP 3:11 PM [facility]

## 2021-09-27 NOTE — Progress Notes (Deleted)
Office Visit    Patient Name: Deanna Gomez Date of Encounter: 09/27/2021  PCP:  Lucky Cowboy, MD   [facility]  Cardiologist:  Verne Carrow, MD  Advanced Practice Provider:  No care team member to display Electrophysiologist:  None    HPI    Deanna Gomez is a 76 y.o. female with a hx of hypertension, diabetes mellitus type 2, hyperlipidemia, and CKD stage II presents today for follow-up appointment.   Patient was admitted 11/04/2020 with syncope and a fall after an increase in bowel movements due to laxative use.  She was found to be in atrial fibrillation with borderline ventricular rate.  She was chronically on atenolol for hypertension.  She was asymptomatic with atrial fibrillation.  2D echocardiogram with normal LVEF and severe LVH.  She had mild worsening of renal parameters with hypovolemia and severe hypokalemia.  Atrial fibrillation in setting of major electrolyte abnormality but CHA2DS2-VASc over 6 she was placed on Eliquis.  HCTZ was stopped the potassium was corrected.  She was seen in follow-up 12/08/2020 and was feeling good at that time.  Today, she ***  Past Medical History    Past Medical History:  Diagnosis Date   Abdominal aortic atherosclerosis (HCC) 12/26/2016   Noted incidentally on 12/24/2016 CT   Gastric ulcer 11/19/2014   Hepatic steatosis 12/26/2016   Noted on CT 12/24/2016 - moderate degree without distinct lesions   Hepatitis C    HTN (hypertension)    Hyperlipidemia    Obesity    Pre-diabetes    Scoliosis    Tobacco use    stopped 6 years ago.   Vitamin D deficiency    Past Surgical History:  Procedure Laterality Date   BACK SURGERY  1986   x 3   BREAST RECONSTRUCTION     x 2   CATARACT EXTRACTION     Bilateral    Allergies  Allergies  Allergen Reactions   Fentanyl Other (See Comments)    Intolerlance    History of Present Illness    Deanna Gomez is a 76 y.o. female with  a hx of *** last seen ***.   EKGs/Labs/Other Studies Reviewed:   The following studies were reviewed today: ***  EKG:  EKG is *** ordered today.  The ekg ordered today demonstrates ***  Recent Labs: 06/02/2021: Magnesium 1.6; TSH 2.06 08/31/2021: ALT 16; BUN 11; Creat 0.83; Hemoglobin 16.2; Platelets 348; Potassium 4.5; Sodium 136  Recent Lipid Panel    Component Value Date/Time   CHOL 212 (H) 06/02/2021 1009   TRIG 109 06/02/2021 1009   HDL 98 06/02/2021 1009   CHOLHDL 2.2 06/02/2021 1009   VLDL 23 07/06/2016 1200   LDLCALC 93 06/02/2021 1009    Risk Assessment/Calculations:  {Does this patient have ATRIAL FIBRILLATION?:[phone]}  Home Medications   No outpatient medications have been marked as taking for the 09/28/21 encounter (Appointment) with Sharlene Dory, PA-C.     Review of Systems   ***   All other systems reviewed and are otherwise negative except as noted above.  Physical Exam    VS:  There were no vitals taken for this visit. , BMI There is no height or weight on file to calculate BMI.  Wt Readings from Last 3 Encounters:  09/27/21 199 lb 6.4 oz (90.4 kg)  09/05/21 199 lb (90.3 kg)  09/02/21 198 lb (89.8 kg)     GEN: Well nourished, well developed, in no acute distress.  HEENT: normal. Neck: Supple, no JVD, carotid bruits, or masses. Cardiac: ***RRR, no murmurs, rubs, or gallops. No clubbing, cyanosis, edema.  ***Radials/PT 2+ and equal bilaterally.  Respiratory:  ***Respirations regular and unlabored, clear to auscultation bilaterally. GI: Soft, nontender, nondistended. MS: No deformity or atrophy. Skin: Warm and dry, no rash. Neuro:  Strength and sensation are intact. Psych: Normal affect.  Assessment & Plan    Paroxysmal atrial fibrillation anticoagulated on Eliquis  Hypertension  Hyperlipidemia  Aortic insufficiency     Disposition: Follow up {follow up:15908} with Verne Carrow, MD or APP.  Signed, Sharlene Dory,  PA-C 09/27/2021, 9:11 PM [facility]

## 2021-09-27 NOTE — Patient Instructions (Signed)
Complete Abdominal x-ray at [facility]   Constipation, Adult Constipation is when a person has fewer than three bowel movements in a week, has difficulty having a bowel movement, or has stools (feces) that are dry, hard, or larger than normal. Constipation may be caused by an underlying condition. It may become worse with age if a person takes certain medicines and does not take in enough fluids. Follow these instructions at home: Eating and drinking  Eat foods that have a lot of fiber, such as beans, whole grains, and fresh fruits and vegetables. Limit foods that are low in fiber and high in fat and processed sugars, such as fried or sweet foods. These include french fries, hamburgers, cookies, candies, and soda. Drink enough fluid to keep your urine pale yellow. General instructions Exercise regularly or as told by your health care provider. Try to do 150 minutes of moderate exercise each week. Use the bathroom when you have the urge to go. Do not hold it in. Take over-the-counter and prescription medicines only as told by your health care provider. This includes any fiber supplements. During bowel movements: Practice deep breathing while relaxing the lower abdomen. Practice pelvic floor relaxation. Watch your condition for any changes. Let your health care provider know about them. Keep all follow-up visits as told by your health care provider. This is important. Contact a health care provider if: You have pain that gets worse. You have a fever. You do not have a bowel movement after 4 days. You vomit. You are not hungry or you lose weight. You are bleeding from the opening between the buttocks (anus). You have thin, pencil-like stools. Get help right away if: You have a fever and your symptoms suddenly get worse. You leak stool or have blood in your stool. Your abdomen is bloated. You have severe pain in your abdomen. You feel dizzy or you  faint. Summary Constipation is when a person has fewer than three bowel movements in a week, has difficulty having a bowel movement, or has stools (feces) that are dry, hard, or larger than normal. Eat foods that have a lot of fiber, such as beans, whole grains, and fresh fruits and vegetables. Drink enough fluid to keep your urine pale yellow. Take over-the-counter and prescription medicines only as told by your health care provider. This includes any fiber supplements. This information is not intended to replace advice given to you by your health care provider. Make sure you discuss any questions you have with your health care provider. Document Revised: 01/08/2019 Document Reviewed: 01/08/2019 Elsevier Patient Education  2023 [employer].

## 2021-09-28 ENCOUNTER — Ambulatory Visit: Payer: Medicare PPO | Admitting: Physician Assistant

## 2021-09-28 ENCOUNTER — Other Ambulatory Visit: Payer: Self-pay | Admitting: Cardiovascular Disease

## 2021-09-28 DIAGNOSIS — I10 Essential (primary) hypertension: Secondary | ICD-10-CM

## 2021-09-28 DIAGNOSIS — I480 Paroxysmal atrial fibrillation: Secondary | ICD-10-CM

## 2021-09-28 DIAGNOSIS — E782 Mixed hyperlipidemia: Secondary | ICD-10-CM

## 2021-09-28 DIAGNOSIS — I351 Nonrheumatic aortic (valve) insufficiency: Secondary | ICD-10-CM

## 2021-09-28 LAB — CBC WITH DIFFERENTIAL/PLATELET
Absolute Monocytes: 821 cells/uL (ref 200–950)
Basophils Absolute: 46 cells/uL (ref 0–200)
Basophils Relative: 0.4 %
Eosinophils Absolute: 11 cells/uL — ABNORMAL LOW (ref 15–500)
Eosinophils Relative: 0.1 %
HCT: 47.1 % — ABNORMAL HIGH (ref 35.0–45.0)
Hemoglobin: 16.3 g/dL — ABNORMAL HIGH (ref 11.7–15.5)
Lymphs Abs: 1436 cells/uL (ref 850–3900)
MCH: 34.6 pg — ABNORMAL HIGH (ref 27.0–33.0)
MCHC: 34.6 g/dL (ref 32.0–36.0)
MCV: 100 fL (ref 80.0–100.0)
MPV: 10.3 fL (ref 7.5–12.5)
Monocytes Relative: 7.2 %
Neutro Abs: 9086 cells/uL — ABNORMAL HIGH (ref 1500–7800)
Neutrophils Relative %: 79.7 %
Platelets: 311 10*3/uL (ref 140–400)
RBC: 4.71 10*6/uL (ref 3.80–5.10)
RDW: 12.6 % (ref 11.0–15.0)
Total Lymphocyte: 12.6 %
WBC: 11.4 10*3/uL — ABNORMAL HIGH (ref 3.8–10.8)

## 2021-09-28 LAB — COMPLETE METABOLIC PANEL WITH GFR
AG Ratio: 1.6 (calc) (ref 1.0–2.5)
ALT: 19 U/L (ref 6–29)
AST: 23 U/L (ref 10–35)
Albumin: 4.4 g/dL (ref 3.6–5.1)
Alkaline phosphatase (APISO): 75 U/L (ref 37–153)
BUN: 14 mg/dL (ref 7–25)
CO2: 31 mmol/L (ref 20–32)
Calcium: 10.5 mg/dL — ABNORMAL HIGH (ref 8.6–10.4)
Chloride: 89 mmol/L — ABNORMAL LOW (ref 98–110)
Creat: 0.8 mg/dL (ref 0.60–1.00)
Globulin: 2.8 g/dL (calc) (ref 1.9–3.7)
Glucose, Bld: 91 mg/dL (ref 65–99)
Potassium: 4.7 mmol/L (ref 3.5–5.3)
Sodium: 134 mmol/L — ABNORMAL LOW (ref 135–146)
Total Bilirubin: 1.2 mg/dL (ref 0.2–1.2)
Total Protein: 7.2 g/dL (ref 6.1–8.1)
eGFR: 75 mL/min/{1.73_m2} (ref >=60)

## 2021-09-28 MED ORDER — HYDROCHLOROTHIAZIDE 12.5 MG PO CAPS: 12.5000 mg | ORAL_CAPSULE | Freq: Every day | ORAL | 0 refills | Status: DC

## 2021-09-28 MED ORDER — LOSARTAN POTASSIUM 50 MG PO TABS: 50.0000 mg | ORAL_TABLET | Freq: Every day | ORAL | 0 refills | Status: DC

## 2021-09-28 MED ORDER — APIXABAN 5 MG PO TABS: 5.0000 mg | ORAL_TABLET | Freq: Two times a day (BID) | ORAL | 1 refills | Status: DC

## 2021-09-28 NOTE — Telephone Encounter (Signed)
Prescription refill request for Eliquis received. Indication: Afib  Last office visit: 12/08/20 Geni Bers) Scr: 0.80 (03/30/21) Age: 76 Weight: 90.4kg  Appropriate dose and refill sent to requested pharmacy.

## 2021-09-28 NOTE — Telephone Encounter (Signed)
Pt's medications were sent to pt's pharmacy as requested. Confirmation received.  

## 2021-09-28 NOTE — Telephone Encounter (Signed)
*  STAT* If patient is at the pharmacy, call can be transferred to refill team.   1. Which medications need to be refilled? (please list name of each medication and dose if known) losartan (COZAAR) 50 MG tablet hydrochlorothiazide (MICROZIDE) 12.5 MG capsule apixaban (ELIQUIS) 5 MG TABS tablet  2. Which pharmacy/location (including street and city if local pharmacy) is medication to be sent to? [facility] [id number] - [place], [place] - [address]  3. Do they need a 30 day or 90 day supply? 90 day

## 2021-10-06 ENCOUNTER — Ambulatory Visit (INDEPENDENT_AMBULATORY_CARE_PROVIDER_SITE_OTHER): Payer: Medicare PPO

## 2021-10-06 VITALS — BP 104/60 | HR 101 | Temp 97.5°F | Ht 65.0 in | Wt 195.0 lb

## 2021-10-06 DIAGNOSIS — Z79899 Other long term (current) drug therapy: Secondary | ICD-10-CM

## 2021-10-06 NOTE — Progress Notes (Signed)
Patient presents to the office for a nurse visit to have labs done. Blood pressure 104/60, pulse (!) 101, temperature (!) 97.5 F (36.4 C), height 5\' 5"  (1.651 m), weight 195 lb (88.5 kg), SpO2 94 %.

## 2021-10-07 LAB — COMPLETE METABOLIC PANEL WITH GFR
AG Ratio: 1.7 (calc) (ref 1.0–2.5)
ALT: 19 U/L (ref 6–29)
AST: 32 U/L (ref 10–35)
Albumin: 4.4 g/dL (ref 3.6–5.1)
Alkaline phosphatase (APISO): 70 U/L (ref 37–153)
BUN: 14 mg/dL (ref 7–25)
CO2: 28 mmol/L (ref 20–32)
Calcium: 9.9 mg/dL (ref 8.6–10.4)
Chloride: 93 mmol/L — ABNORMAL LOW (ref 98–110)
Creat: 0.97 mg/dL (ref 0.60–1.00)
Globulin: 2.6 g/dL (calc) (ref 1.9–3.7)
Glucose, Bld: 129 mg/dL — ABNORMAL HIGH (ref 65–99)
Potassium: 3.7 mmol/L (ref 3.5–5.3)
Sodium: 136 mmol/L (ref 135–146)
Total Bilirubin: 0.9 mg/dL (ref 0.2–1.2)
Total Protein: 7 g/dL (ref 6.1–8.1)
eGFR: 60 mL/min/{1.73_m2} (ref >=60)

## 2021-10-11 NOTE — Progress Notes (Deleted)
Office Visit    Patient Name: Deanna Gomez Date of Encounter: 10/11/2021  PCP:  Lucky Cowboy, MD   [facility]  Cardiologist:  Verne Carrow, MD  Advanced Practice Provider:  No care team member to display Electrophysiologist:  None    HPI    Deanna Gomez is a 76 y.o. female with a hx of hypertension, diabetes mellitus type 2, hyperlipidemia, and CKD stage II presents today for follow-up appointment.   Patient was admitted 11/04/2020 with syncope and a fall after an increase in bowel movements due to laxative use.  She was found to be in atrial fibrillation with borderline ventricular rate.  She was chronically on atenolol for hypertension.  She was asymptomatic with atrial fibrillation.  2D echocardiogram with normal LVEF and severe LVH.  She had mild worsening of renal parameters with hypovolemia and severe hypokalemia.  Atrial fibrillation in setting of major electrolyte abnormality but CHA2DS2-VASc over 6 she was placed on Eliquis.  HCTZ was stopped the potassium was corrected.  She was seen in follow-up 12/08/2020 and was feeling good at that time.  Today, she ***  Past Medical History    Past Medical History:  Diagnosis Date   Abdominal aortic atherosclerosis (HCC) 12/26/2016   Noted incidentally on 12/24/2016 CT   Gastric ulcer 11/19/2014   Hepatic steatosis 12/26/2016   Noted on CT 12/24/2016 - moderate degree without distinct lesions   Hepatitis C    HTN (hypertension)    Hyperlipidemia    Obesity    Pre-diabetes    Scoliosis    Tobacco use    stopped 6 years ago.   Vitamin D deficiency    Past Surgical History:  Procedure Laterality Date   BACK SURGERY  1986   x 3   BREAST RECONSTRUCTION     x 2   CATARACT EXTRACTION     Bilateral    Allergies  Allergies  Allergen Reactions   Fentanyl Other (See Comments)    Intolerlance    History of Present Illness    Deanna Gomez is a 76 y.o. female with a  hx of *** last seen ***.   EKGs/Labs/Other Studies Reviewed:   The following studies were reviewed today: ***  EKG:  EKG is *** ordered today.  The ekg ordered today demonstrates ***  Recent Labs: 06/02/2021: Magnesium 1.6; TSH 2.06 09/27/2021: Hemoglobin 16.3; Platelets 311 10/06/2021: ALT 19; BUN 14; Creat 0.97; Potassium 3.7; Sodium 136  Recent Lipid Panel    Component Value Date/Time   CHOL 212 (H) 06/02/2021 1009   TRIG 109 06/02/2021 1009   HDL 98 06/02/2021 1009   CHOLHDL 2.2 06/02/2021 1009   VLDL 23 07/06/2016 1200   LDLCALC 93 06/02/2021 1009    Risk Assessment/Calculations:  {Does this patient have ATRIAL FIBRILLATION?:[phone]}  Home Medications   No outpatient medications have been marked as taking for the 10/12/21 encounter (Appointment) with Sharlene Dory, PA-C.     Review of Systems   ***   All other systems reviewed and are otherwise negative except as noted above.  Physical Exam    VS:  There were no vitals taken for this visit. , BMI There is no height or weight on file to calculate BMI.  Wt Readings from Last 3 Encounters:  10/06/21 195 lb (88.5 kg)  09/27/21 199 lb 6.4 oz (90.4 kg)  09/05/21 199 lb (90.3 kg)     GEN: Well nourished, well developed, in no acute  distress. HEENT: normal. Neck: Supple, no JVD, carotid bruits, or masses. Cardiac: ***RRR, no murmurs, rubs, or gallops. No clubbing, cyanosis, edema.  ***Radials/PT 2+ and equal bilaterally.  Respiratory:  ***Respirations regular and unlabored, clear to auscultation bilaterally. GI: Soft, nontender, nondistended. MS: No deformity or atrophy. Skin: Warm and dry, no rash. Neuro:  Strength and sensation are intact. Psych: Normal affect.  Assessment & Plan    Paroxysmal atrial fibrillation anticoagulated on Eliquis  Hypertension  Hyperlipidemia  Aortic insufficiency     Disposition: Follow up {follow up:15908} with Verne Carrow, MD or APP.  Signed, Sharlene Dory,  PA-C 10/11/2021, 7:48 PM [facility]

## 2021-10-12 ENCOUNTER — Ambulatory Visit: Payer: Medicare PPO | Admitting: Physician Assistant

## 2021-10-12 DIAGNOSIS — I480 Paroxysmal atrial fibrillation: Secondary | ICD-10-CM

## 2021-10-12 DIAGNOSIS — I10 Essential (primary) hypertension: Secondary | ICD-10-CM

## 2021-10-12 DIAGNOSIS — E785 Hyperlipidemia, unspecified: Secondary | ICD-10-CM

## 2021-10-12 DIAGNOSIS — I351 Nonrheumatic aortic (valve) insufficiency: Secondary | ICD-10-CM

## 2021-10-19 NOTE — Progress Notes (Unsigned)
Office Visit    Patient Name: Deanna Gomez Date of Encounter: 10/20/2021  PCP:  [place] [place], [facility]  Cardiologist:  Lauree Chandler, MD  Advanced Practice Provider:  No care team member to display Electrophysiologist:  None    HPI    Deanna Gomez is a 76 y.o. female with a hx of hypertension, diabetes mellitus type 2, hyperlipidemia, and CKD stage II presents today for follow-up appointment.   Patient was admitted 11/04/2020 with syncope and a fall after an increase in bowel movements due to laxative use.  She was found to be in atrial fibrillation with borderline ventricular rate.  She was chronically on atenolol for hypertension.  She was asymptomatic with atrial fibrillation.  2D echocardiogram with normal LVEF and severe LVH.  She had mild worsening of renal parameters with hypovolemia and severe hypokalemia.  Atrial fibrillation in setting of major electrolyte abnormality but CHA2DS2-VASc over 6 she was placed on Eliquis.  HCTZ was stopped the potassium was corrected.  She was seen in follow-up 12/08/2020 and was feeling good at that time.  Today, she states that overall she has been doing really well.  She lives by herself and her daughter helps her sort out her medications.  Her daughter would like to get some help for her since she lives in [place] and can always be there to help.  She states she sometimes feels weak from time to time but this is usually due to not eating well.  She recently had some labs and her potassium was low normal.  We plan to order a magnesium lab today.  EKG was obtained and showed atrial fibrillation, rate controlled.  EKG reviewed with Dr. Caryl Comes.  She has had atrial fibrillation since last year and potentially could be a DCCV candidate.  She is currently on atenolol 100 mg daily, anticoagulated with Eliquis 5 mg twice daily, and takes losartan 50 mg daily as well as HCTZ 12.5 mg daily for her blood  pressure.  She has a history of noncompliance with some of her medications so her daughter organizes them for her into pill packs.  Reports no shortness of breath nor dyspnea on exertion. Reports no chest pain, pressure, or tightness. No edema, orthopnea, PND. Reports no palpitations.    Past Medical History    Past Medical History:  Diagnosis Date   Abdominal aortic atherosclerosis ([place]) 12/26/2016   Noted incidentally on 12/24/2016 CT   Gastric ulcer 11/19/2014   Hepatic steatosis 12/26/2016   Noted on CT 12/24/2016 - moderate degree without distinct lesions   Hepatitis C    HTN (hypertension)    Hyperlipidemia    Obesity    Pre-diabetes    Scoliosis    Tobacco use    stopped 6 years ago.   Vitamin D deficiency    Past Surgical History:  Procedure Laterality Date   BACK SURGERY  1986   x 3   BREAST RECONSTRUCTION     x 2   CATARACT EXTRACTION     Bilateral    Allergies  Allergies  Allergen Reactions   Fentanyl Other (See Comments)    Intolerlance      EKGs/Labs/Other Studies Reviewed:   The following studies were reviewed today:  Echocardiogram 11/05/2020  IMPRESSIONS     1. Left ventricular ejection fraction, by estimation, is 60%. The left  ventricle has normal function. The left ventricle has no regional wall  motion abnormalities. There is severe  concentric left ventricular  hypertrophy. Left ventricular diastolic  parameters are consistent with Grade I diastolic dysfunction (impaired  relaxation).   2. Right ventricular systolic function is normal. The right ventricular  size is normal. There is normal pulmonary artery systolic pressure.   3. The mitral valve is grossly normal. No evidence of mitral valve  regurgitation. No evidence of mitral stenosis.   4. The aortic valve is tricuspid. There is moderate calcification of the  aortic valve. There is mild thickening of the aortic valve. Aortic valve  regurgitation is not visualized.   5. Aaortic  valve sclerosis is present, with no evidence of aortic valve  stenosis.   6. Aortic dilatation noted. There is mild dilatation of the ascending  aorta, measuring 42 mm.   7. The inferior vena cava is normal in size with greater than 50%  respiratory variability, suggesting right atrial pressure of 3 mmHg.   Comparison(s): A prior study was performed on 11/28/2019. Slight increase  in ascending aortic size.   EKG:  EKG is  ordered today.  The ekg ordered today demonstrates rate controlled atrial fibrillation, rate 78 bpm  Recent Labs: 06/02/2021: Magnesium 1.6; TSH 2.06 09/27/2021: Hemoglobin 16.3; Platelets 311 10/06/2021: ALT 19; BUN 14; Creat 0.97; Potassium 3.7; Sodium 136  Recent Lipid Panel    Component Value Date/Time   CHOL 212 (H) 06/02/2021 1009   TRIG 109 06/02/2021 1009   HDL 98 06/02/2021 1009   CHOLHDL 2.2 06/02/2021 1009   VLDL 23 07/06/2016 1200   LDLCALC 93 06/02/2021 1009    Risk Assessment/Calculations:   CHA2DS2-VASc Score = 6   This indicates a 9.7% annual risk of stroke. The patient's score is based upon: CHF History: 0 HTN History: 1 Diabetes History: 1 Stroke History: 0 Vascular Disease History: 1 Age Score: 2 Gender Score: 1     Home Medications   Current Meds  Medication Sig   ALPRAZolam (XANAX) 0.5 MG tablet Take  1/2 - 1 tablet  1 to 2  x /day  ONLY  if needed for Anxiety Attack or sleep  &  limit to 5 days /week to avoid Addiction & Dementia   apixaban (ELIQUIS) 5 MG TABS tablet Take 1 tablet (5 mg total) by mouth 2 (two) times daily.   atenolol (TENORMIN) 100 MG tablet Take 1 tablet (100 mg total) by mouth daily.   Cholecalciferol (VITAMIN D3) 50 MCG (2000 UT) capsule Take 2,000 Units by mouth daily.   dapagliflozin propanediol (FARXIGA) 10 MG TABS tablet Take 1 tablet by mouth daily. For kidney protection.   nystatin (NYAMYC) powder APPLY DAILY AFTER A SHOWER AS DIRECTED AS NEEDED   Polyethyl Glycol-Propyl Glycol (SYSTANE) 0.4-0.3 % SOLN  Apply 1 drop to eye 3 (three) times daily as needed (dry eyes).   potassium chloride (KLOR-CON) 10 MEQ tablet Take 1 tablet (10 mEq total) by mouth daily.   rosuvastatin (CRESTOR) 40 MG tablet Take  1 tablet  Daily  for Cholesterol   senna (SENOKOT) 8.6 MG TABS tablet Take 1 tablet (8.6 mg total) by mouth daily as needed for mild constipation.   [DISCONTINUED] hydrochlorothiazide (MICROZIDE) 12.5 MG capsule Take 1 capsule (12.5 mg total) by mouth daily.   [DISCONTINUED] losartan (COZAAR) 50 MG tablet Take 1 tablet (50 mg total) by mouth daily.     Review of Systems      All other systems reviewed and are otherwise negative except as noted above.  Physical Exam    VS:  BP  110/70   Ht 5\' 5"  (1.651 m)   Wt 197 lb 9.6 oz (89.6 kg)   BMI 32.88 kg/m  , BMI Body mass index is 32.88 kg/m.  Wt Readings from Last 3 Encounters:  10/20/21 197 lb 9.6 oz (89.6 kg)  10/06/21 195 lb (88.5 kg)  09/27/21 199 lb 6.4 oz (90.4 kg)     GEN: Well nourished, well developed, in no acute distress. HEENT: normal. Neck: Supple, no JVD, carotid bruits, or masses. Cardiac: Irregularly irregular, no murmurs, rubs, or gallops. No clubbing, cyanosis, edema.  Radials/PT 2+ and equal bilaterally.  Respiratory:  Respirations regular and unlabored, clear to auscultation bilaterally. GI: Soft, nontender, nondistended. MS: No deformity or atrophy. Skin: Warm and dry, no rash. Neuro:  Strength and sensation are intact. Psych: Normal affect.  Assessment & Plan    Paroxysmal atrial fibrillation anticoagulated on Eliquis -Rate controlled today, 78 bpm, EKG reviewed with Dr. 09/29/21 -She is on atenolol 100 mg daily with history of some noncompliance (her daughter helps her with her pill packs) -We have referred her to the atrial fibrillation clinic -She has been in atrial fibrillation for a year now and may be a DCCV candidate, however not particularly symptomatic at this time.  She did endorse feeling weak but this  was mostly associated with not eating  Hypertension - BP good today -just found BP cuff, suggested taking BP a few times a week  Hyperlipidemia -recent labs 3/23 showed total cholesterol 212, LDL 98, HDL 98, triglycerides 109 -Will need follow-up labs 3/24 -continue  Aortic insufficiency and aortic dilation  -we will order an echo today for further work-up and to evaluate left atrium size    Disposition: Follow up 3 months with 4/24, MD or APP.  Signed, Verne Carrow, PA-C 10/20/2021, 6:04 PM [facility]

## 2021-10-20 ENCOUNTER — Ambulatory Visit: Payer: Medicare PPO | Admitting: Physician Assistant

## 2021-10-20 ENCOUNTER — Encounter: Payer: Self-pay | Admitting: Physician Assistant

## 2021-10-20 VITALS — BP 110/70 | Ht 65.0 in | Wt 197.6 lb

## 2021-10-20 DIAGNOSIS — I480 Paroxysmal atrial fibrillation: Secondary | ICD-10-CM | POA: Diagnosis not present

## 2021-10-20 DIAGNOSIS — I351 Nonrheumatic aortic (valve) insufficiency: Secondary | ICD-10-CM

## 2021-10-20 DIAGNOSIS — I10 Essential (primary) hypertension: Secondary | ICD-10-CM | POA: Diagnosis not present

## 2021-10-20 DIAGNOSIS — E785 Hyperlipidemia, unspecified: Secondary | ICD-10-CM

## 2021-10-20 MED ORDER — LOSARTAN POTASSIUM 50 MG PO TABS: 50.0000 mg | ORAL_TABLET | Freq: Every day | ORAL | 3 refills | Status: DC

## 2021-10-20 MED ORDER — POTASSIUM CHLORIDE ER 10 MEQ PO TBCR: 10.0000 meq | EXTENDED_RELEASE_TABLET | Freq: Every day | ORAL | 3 refills | Status: DC

## 2021-10-20 MED ORDER — HYDROCHLOROTHIAZIDE 12.5 MG PO CAPS: 12.5000 mg | ORAL_CAPSULE | Freq: Every day | ORAL | 3 refills | Status: DC

## 2021-10-20 NOTE — Patient Instructions (Addendum)
Medication Instructions:  1.Start potassium chloride 10 meq daily 2.Be sure that you take your eliquis 5 mg twice a day and do not miss any doses *If you need a refill on your cardiac medications before your next appointment, please call your pharmacy*   Lab Work: Magnesium today If you have labs (blood work) drawn today and your tests are completely normal, you will receive your results only by: MyChart Message (if you have MyChart) OR A paper copy in the mail If you have any lab test that is abnormal or we need to change your treatment, we will call you to review the results.   Testing/Procedures: Your physician has requested that you have an echocardiogram. Echocardiography is a painless test that uses sound waves to create images of your heart. It provides your doctor with information about the size and shape of your heart and how well your heart's chambers and valves are working. This procedure takes approximately one hour. There are no restrictions for this procedure.    Follow-Up: At [facility], you and your health needs are our priority.  As part of our continuing mission to provide you with exceptional heart care, we have created designated Provider Care Teams.  These Care Teams include your primary Cardiologist (physician) and Advanced Practice Providers (APPs -  Physician Assistants and Nurse Practitioners) who all work together to provide you with the care you need, when you need it.  We recommend signing up for the patient portal called "MyChart".  Sign up information is provided on this After Visit Summary.  MyChart is used to connect with patients for Virtual Visits (Telemedicine).  Patients are able to view lab/test results, encounter notes, upcoming appointments, etc.  Non-urgent messages can be sent to your provider as well.   To learn more about what you can do with MyChart, go to [email or web address].    Your next appointment:   3 month(s)  The format for your  next appointment:   In Person  Provider:   Verne Carrow, MD  or Jari Favre, PA-C     { Other Instructions 1.You have been referred to the A-fib clinic 2.You have been referred to home health, a social worker will be in contact with you   Important Information About Sugar

## 2021-10-21 ENCOUNTER — Other Ambulatory Visit: Payer: Self-pay | Admitting: *Deleted

## 2021-10-21 DIAGNOSIS — Z79899 Other long term (current) drug therapy: Secondary | ICD-10-CM

## 2021-10-21 DIAGNOSIS — I10 Essential (primary) hypertension: Secondary | ICD-10-CM

## 2021-10-21 DIAGNOSIS — I480 Paroxysmal atrial fibrillation: Secondary | ICD-10-CM

## 2021-10-21 LAB — MAGNESIUM: Magnesium: 1.9 mg/dL (ref 1.6–2.3)

## 2021-10-26 ENCOUNTER — Other Ambulatory Visit: Payer: Self-pay

## 2021-10-26 MED ORDER — ROSUVASTATIN CALCIUM 40 MG PO TABS: ORAL_TABLET | ORAL | 3 refills | Status: DC

## 2021-11-03 ENCOUNTER — Ambulatory Visit (HOSPITAL_COMMUNITY): Payer: Medicare PPO

## 2021-11-09 ENCOUNTER — Other Ambulatory Visit: Payer: Self-pay | Admitting: Nurse Practitioner

## 2021-11-09 DIAGNOSIS — G4700 Insomnia, unspecified: Secondary | ICD-10-CM

## 2021-11-21 DIAGNOSIS — Z86010 Personal history of colonic polyps: Secondary | ICD-10-CM | POA: Diagnosis not present

## 2021-12-14 ENCOUNTER — Ambulatory Visit: Payer: Medicare PPO | Admitting: Nurse Practitioner

## 2022-01-02 NOTE — Progress Notes (Deleted)
Patient ID: Deanna Gomez, female   DOB: 1945/08/18, 76 y.o.   MRN: [id number]   3 MONTH FOLLOW UP  Assessment:    Paroxysmal Atrial Fibrillation Continue medication and follow with cardiology  Atherosclerosis of aorta Per CT 2018 Control blood pressure, cholesterol, glucose, increase exercise.   Diastolic CHF Grade 2 dysfunction, Weight stable/down, appears euvolemic Followed by cardiology  Essential hypertension - continue medications, DASH diet, exercise and monitor at home. Call if greater than 130/80.  - Go to the ER if any chest pain, shortness of breath, nausea, dizziness, severe HA, changes vision/speech  - CBC  Hepatitis C virus infection without hepatic coma, unspecified chronicity S/p treatment, in remission; monitor LFTs at routine OVs  Hepatic steatosis Weight loss advised, low processed carb diet, avoid alcohol/tylenol, will monitor LFTs  Hyperlipidemia -continue medications, check lipids, decrease fatty foods, increase activity.  -lipid panel - CMP  Other abnormal glucose (hx of prediabetes) Discussed general issues about diabetes pathophysiology and management., Educational material distributed., Suggested low cholesterol diet., Encouraged aerobic exercise., Discussed foot care., Reminded to get yearly retinal exam. - A1c  Morbid obesity -  Long discussion about weight loss, diet, and exercise Recommended diet heavy in fruits and veggies and low in animal meats, cheeses, and dairy products, appropriate calorie intake Patient will work on getting back on metabolism regulation diet and start working Discussed appropriate weight for height and initial goal (200lb) Follow up at next visit  Vitamin D deficiency At goal at recent check; continue to recommend supplementation for goal of 60-100 - Vitamin D level  Chronic midline back pain Hx of severe scoliosis with fusion 10+ years ago, sees a provider in [place].  Insomnia Uses xanax  PRN good sleep hygiene discussed, increase day time activity, try melatonin or benadryl        Future Appointments  Date Time Provider Department Center  01/03/2022  2:30 PM Raynelle Dick, NP [facility] None  01/12/2022  1:30 PM Sharlene Dory, PA-C [facility]  06/05/2022  9:00 AM Raynelle Dick, NP [facility] None  12/20/2022 11:00 AM Adela Glimpse, NP [facility] None      Subjective:   Deanna Gomez is a 76 y.o. female who presents for 3 month follow up. She has Essential hypertension; Hyperlipidemia, mixed; Abnormal glucose; Vitamin D deficiency; History of hepatitis C; Morbid obesity (HCC); Hepatic steatosis; Abdominal aortic atherosclerosis (HCC); Diastolic CHF (HCC); Insomnia; Constipation; History of colon polyps; Osteopenia; B12 deficiency; Persistent proteinuria; Syncope; AKI (acute kidney injury) (HCC); Fall at home, initial encounter; and Paroxysmal atrial fibrillation (HCC) on their problem list.  She is widowed. She has 1 biological child, 4 step children, 8 grandchildren and 1 great grandchild. She is a retired Hospital doctor.   Her husband of 36 years passed 01/04/2020 due to advanced cardiac disease. Now living by herself, feels she is doing well. Did get established with grief counselor, will continue to follow. Doesn't want daily med. She has xanax 0.25-0.5 mg which she takes only if unable to fall asleep after waking up in the middle of the night or if very anxious due to storm. Typically takes 0.25 mg, 3-4 days per week.   She is being followed by Dr. Clifton James for paroxysmal atrial fibrillation.  Currently on Atenolol and Eliquis.  Significant history include Chronic LBP from severe thoracolumbar scoliosis s/p DDD fusion surgery x 3. Established with local provider and benefits from PT intermittently. She has continued with home exercises since covid 19.  She also underwent curative treatment for Hepatitis C (1987).   BMI is  There is no height or weight on file to calculate BMI., she was going to PT but stopped with husband illness, plans to restart, stretching/strenght training at home daily, is working on diet/portions, Weight goal <200 lb.  Wt Readings from Last 3 Encounters:  10/20/21 197 lb 9.6 oz (89.6 kg)  10/06/21 195 lb (88.5 kg)  09/27/21 199 lb 6.4 oz (90.4 kg)   She has had elevated blood pressure since 1996. Her blood pressure has been controlled at home, reports somewhat labile & today their BP is    BP Readings from Last 3 Encounters:  10/20/21 110/70  10/06/21 104/60  09/27/21 (!) 120/93   . She denies dizziness, CP, dyspnea, edema.   She does workout. She denies chest pain, shortness of breath, dizziness.  She has aortic atherosclerosis per CT 2018.  She is following with cardiology after ECHO in 2019 ordered due to exertional dyspnea demonstrated grade 2 diastolic dysfunction; BP and symptoms improved recently.   She denies dyspnea on exertion, orthopnea, paroxysmal nocturnal dyspnea and edema. Positive for none.  She is on cholesterol medication (rosuvastatin 40 mg daily, admits doesn't take regularly, perhaps 2-3 days per week) and denies myalgias. Her cholesterol is not at goal. The cholesterol last visit was:  Lab Results  Component Value Date   CHOL 212 (H) 06/02/2021   HDL 98 06/02/2021   LDLCALC 93 06/02/2021   TRIG 109 06/02/2021   CHOLHDL 2.2 06/02/2021   She had prediabetes in 2012. She has been working on diet and exercise for glucose management, and denies foot ulcerations, hyperglycemia, hypoglycemia , increased appetite, nausea, paresthesia of the feet, polydipsia, polyuria, visual disturbances, vomiting and weight loss. Last A1C in the office was:  Lab Results  Component Value Date   HGBA1C 5.2 06/02/2021   basecline CKD II, denies NSAID use, fluid intake varies, typically about 3 bottles/week.  Last GFR:  Lab Results  Component Value Date   GFRNONAA 55 (L)  11/07/2020   GFRNONAA 57 (L) 11/06/2020   GFRNONAA 52 (L) 11/06/2020   Patient is on Vitamin D supplement  Lab Results  Component Value Date   VD25OH 41 06/02/2021        Medication Review: Current Outpatient Medications on File Prior to Visit  Medication Sig   ALPRAZolam (XANAX) 0.5 MG tablet TAKE 1/2-1 TABLETS BY MOUTH TWO TIMES A DAY AS NEEDED FOR ANXIETY ATTACK OR SLEEP. LIMIT TO 5 DAYS PER WEEK TO AVOID ADDICTION AND DEMENTIA   apixaban (ELIQUIS) 5 MG TABS tablet Take 1 tablet (5 mg total) by mouth 2 (two) times daily.   atenolol (TENORMIN) 100 MG tablet Take 1 tablet (100 mg total) by mouth daily.   Cholecalciferol (VITAMIN D3) 50 MCG (2000 UT) capsule Take 2,000 Units by mouth daily.   dapagliflozin propanediol (FARXIGA) 10 MG TABS tablet Take 1 tablet by mouth daily. For kidney protection.   hydrochlorothiazide (MICROZIDE) 12.5 MG capsule Take 1 capsule (12.5 mg total) by mouth daily.   losartan (COZAAR) 50 MG tablet Take 1 tablet (50 mg total) by mouth daily.   nystatin (NYAMYC) powder APPLY DAILY AFTER A SHOWER AS DIRECTED AS NEEDED   Polyethyl Glycol-Propyl Glycol (SYSTANE) 0.4-0.3 % SOLN Apply 1 drop to eye 3 (three) times daily as needed (dry eyes).   potassium chloride (KLOR-CON) 10 MEQ tablet Take 1 tablet (10 mEq total) by mouth daily.   rosuvastatin (CRESTOR) 40 MG tablet  Take one tablet daily for cholesterol   senna (SENOKOT) 8.6 MG TABS tablet Take 1 tablet (8.6 mg total) by mouth daily as needed for mild constipation.   traZODone (DESYREL) 150 MG tablet Take 1/2 to 1 tablet   1 to 2 hours   before Bedtime as needed for Sleep (Patient not taking: Reported on 10/20/2021)   No current facility-administered medications on file prior to visit.    Current Problems (verified) Patient Active Problem List   Diagnosis Date Noted   Paroxysmal atrial fibrillation ([place]) 06/02/2021   Syncope 11/04/2020   AKI (acute kidney injury) ([place]) 11/04/2020   Fall at home, initial  encounter 11/04/2020   Persistent proteinuria 05/07/2020   B12 deficiency 05/04/2020   History of colon polyps 07/17/2019   Osteopenia 07/17/2019   Constipation 01/06/2019   Insomnia [id number]   Diastolic CHF ([place]) [id number]   Hepatic steatosis 12/26/2016   Abdominal aortic atherosclerosis ([place]) 12/26/2016   Morbid obesity ([place]) 10/15/2014   Essential hypertension 03/09/2013   Hyperlipidemia, mixed 03/09/2013   Abnormal glucose 03/09/2013   Vitamin D deficiency 03/09/2013   History of hepatitis C 03/09/2013    Screening Tests Immunization History  Administered Date(s) Administered   DTaP 03/06/2006   Influenza Split 11/20/2013, 12/24/2014   Influenza Whole 12/19/2012   Influenza, High Dose Seasonal PF 01/04/2017, 12/10/2017, 11/08/2018, 12/04/2019   PFIZER(Purple Top)SARS-COV-2 Vaccination 03/27/2019, 04/17/2019, 12/15/2019   Pneumococcal Conjugate-13 05/13/2014   Pneumococcal Polysaccharide-23 03/06/2008, 10/25/2016   Typhoid Live 03/06/2004   Zoster, Live 06/11/2007    Preventative care: Last colonoscopy: 10/2014, due 10/2019, referred to Dr. Watt Climes declines further [facility] 12/08/2019 [place] Declines further DEXA: 2010, 2018, 12/2019 - T -1.5 - improved from previous PAP 11/2014 DONE  Echo 2019 - grade 2 diastolic Stress test 0000000  Tdap: 2008, declines Influenza: 11/2019 Pneumonia: 2018 Prevnar: 2016 Shingles: 2009, not interested in shingrix Covid 19: 3/3, 2021, pfizer - has had booster  Names of Other Physician/Practitioners you currently use: 1. [facility] here for primary care 2. Dr. Jerline Pain, eye doctor, last visit 2021 3. Dr. Judd Lien, Dentist, last visit 2021, goes q30m 4. Dr. Delman Cheadle, derm, last 2021, had to reschedule - will see this year  Patient Care Team: Unk Pinto, MD as PCP - General (Internal Medicine) Burnell Blanks, MD as PCP - Cardiology (Cardiology) Richmond Campbell, MD as Consulting Physician  (Gastroenterology) Delila Pereyra, MD as Consulting Physician (Gynecology) Jari Pigg, MD as Consulting Physician (Dermatology) Chauncey Fischer, DO (Ophthalmology) Rehabilitation, Belinda Block as Physical Therapist (Physical Therapy)   Allergies Allergies  Allergen Reactions   Fentanyl Other (See Comments)    Intolerlance    SURGICAL HISTORY She  has a past surgical history that includes Back surgery (1986); Breast reconstruction; and Cataract extraction. FAMILY HISTORY Her family history includes Coronary artery disease in her brother; Hyperlipidemia in her father; Obesity in her brother. SOCIAL HISTORY She  reports that she quit smoking about 17 years ago. Her smoking use included cigarettes. She has a 30.00 pack-year smoking history. She has never used smokeless tobacco. She reports current alcohol use of about 14.0 standard drinks of alcohol per week. She reports that she does not use drugs.  Review of Systems  Constitutional:  Negative for malaise/fatigue and weight loss.  HENT:  Negative for hearing loss and tinnitus.   Eyes:  Negative for blurred vision and double vision.  Respiratory:  Negative for cough, sputum production, shortness of breath and wheezing.   Cardiovascular:  Negative  for chest pain, palpitations, orthopnea, claudication, leg swelling and PND.  Gastrointestinal:  Positive for constipation. Negative for abdominal pain, blood in stool, diarrhea, heartburn, melena, nausea and vomiting.  Genitourinary: Negative.   Musculoskeletal:  Negative for falls, joint pain and myalgias.  Skin:  Negative for rash.  Neurological:  Negative for dizziness, tingling, sensory change, weakness and headaches.  Endo/Heme/Allergies:  Negative for polydipsia.  Psychiatric/Behavioral: Negative.  Negative for depression, memory loss, substance abuse and suicidal ideas. The patient is not nervous/anxious and does not have insomnia.   All other systems reviewed and are  negative.    Objective:     There were no vitals taken for this visit.  General Appearance: Well nourished, in no apparent distress. Eyes: PERRLA, EOMs, conjunctiva no swelling or erythema Sinuses: No Frontal/maxillary tenderness ENT/Mouth: L Ext aud canals clear, TM without erythema, bulging. R canal obstructed by hard wax. No erythema, swelling, or exudate on post pharynx.  Tonsils not swollen or erythematous. Hearing normal.  Neck: Supple, thyroid normal.  Respiratory: Respiratory effort normal, BS equal bilaterally without rales, rhonchi, wheezing or stridor.  Cardio: RRR with no MRGs. Brisk peripheral pulses without edema.  Abdomen: Soft, + BS.  Non tender, no guarding, rebound, masses. She has non-tender easily reduced ventral hernia. Lymphatics: Non tender without lymphadenopathy.  Musculoskeletal: Full ROM, 5/5 strength, Normal gait Skin: Warm, dry without rashes, lesions, ecchymosis.  Neuro: Cranial nerves intact. No cerebellar symptoms.  Psych: Awake and oriented X 3, normal affect, Insight and Judgment appropriate.  Breasts:  breasts appear normal, no suspicious masses, no skin or nipple changes or axillary nodes, excepting symmetrical erythematous rash under left breast fold GU: defer   EKG: Sinus tachycardia, atrial fibrillation  Raynelle Dick, NP   01/02/2022

## 2022-01-03 ENCOUNTER — Ambulatory Visit: Payer: Medicare PPO | Admitting: Nurse Practitioner

## 2022-01-03 DIAGNOSIS — R7309 Other abnormal glucose: Secondary | ICD-10-CM

## 2022-01-03 DIAGNOSIS — K760 Fatty (change of) liver, not elsewhere classified: Secondary | ICD-10-CM

## 2022-01-03 DIAGNOSIS — I700 Atherosclerosis of aorta: Secondary | ICD-10-CM

## 2022-01-03 DIAGNOSIS — D6869 Other thrombophilia: Secondary | ICD-10-CM

## 2022-01-03 DIAGNOSIS — I10 Essential (primary) hypertension: Secondary | ICD-10-CM

## 2022-01-03 DIAGNOSIS — M545 Low back pain, unspecified: Secondary | ICD-10-CM

## 2022-01-03 DIAGNOSIS — I5030 Unspecified diastolic (congestive) heart failure: Secondary | ICD-10-CM

## 2022-01-03 DIAGNOSIS — E782 Mixed hyperlipidemia: Secondary | ICD-10-CM

## 2022-01-03 DIAGNOSIS — I480 Paroxysmal atrial fibrillation: Secondary | ICD-10-CM

## 2022-01-03 DIAGNOSIS — Z79899 Other long term (current) drug therapy: Secondary | ICD-10-CM

## 2022-01-03 DIAGNOSIS — G4700 Insomnia, unspecified: Secondary | ICD-10-CM

## 2022-01-03 DIAGNOSIS — Z8619 Personal history of other infectious and parasitic diseases: Secondary | ICD-10-CM

## 2022-01-03 DIAGNOSIS — E559 Vitamin D deficiency, unspecified: Secondary | ICD-10-CM

## 2022-01-04 ENCOUNTER — Ambulatory Visit: Payer: Medicare PPO | Admitting: Nurse Practitioner

## 2022-01-09 NOTE — Progress Notes (Deleted)
Patient ID: Deanna Gomez, female   DOB: Jan 04, 1946, 76 y.o.   MRN: [id number]   3 MONTH FOLLOW UP  Assessment:    Paroxysmal Atrial Fibrillation Continue medication and follow with cardiology  Atherosclerosis of aorta Per CT 2018 Control blood pressure, cholesterol, glucose, increase exercise.   Diastolic CHF Grade 2 dysfunction, Weight stable/down, appears euvolemic Followed by cardiology  Essential hypertension - continue medications, DASH diet, exercise and monitor at home. Call if greater than 130/80.  - Go to the ER if any chest pain, shortness of breath, nausea, dizziness, severe HA, changes vision/speech  - CBC  Hepatitis C virus infection without hepatic coma, unspecified chronicity S/p treatment, in remission; monitor LFTs at routine OVs  Hepatic steatosis Weight loss advised, low processed carb diet, avoid alcohol/tylenol, will monitor LFTs  Hyperlipidemia -continue medications, check lipids, decrease fatty foods, increase activity.  -lipid panel - CMP  Other abnormal glucose (hx of prediabetes) Discussed general issues about diabetes pathophysiology and management., Educational material distributed., Suggested low cholesterol diet., Encouraged aerobic exercise., Discussed foot care., Reminded to get yearly retinal exam. - A1c  Morbid obesity -  Long discussion about weight loss, diet, and exercise Recommended diet heavy in fruits and veggies and low in animal meats, cheeses, and dairy products, appropriate calorie intake Patient will work on getting back on metabolism regulation diet and start working Discussed appropriate weight for height and initial goal (200lb) Follow up at next visit  Vitamin D deficiency At goal at recent check; continue to recommend supplementation for goal of 60-100 - Vitamin D level  Chronic midline back pain Hx of severe scoliosis with fusion 10+ years ago, sees a provider in [place].  Insomnia Uses xanax  PRN good sleep hygiene discussed, increase day time activity, try melatonin or benadryl        Future Appointments  Date Time Provider [place]  01/10/2022 11:30 AM Alycia Rossetti, NP [facility] None  01/12/2022  1:30 PM Elgie Collard, PA-C [facility]  06/05/2022  9:00 AM Alycia Rossetti, NP [facility] None  12/20/2022 11:00 AM Darrol Jump, NP [facility] None      Subjective:   Deanna Gomez is a 76 y.o. female who presents for 3 month follow up. She has Essential hypertension; Hyperlipidemia, mixed; Abnormal glucose; Vitamin D deficiency; History of hepatitis C; Morbid obesity ([place]); Hepatic steatosis; Abdominal aortic atherosclerosis ([place]); Diastolic CHF ([place]); Insomnia; Constipation; History of colon polyps; Osteopenia; B12 deficiency; Persistent proteinuria; Syncope; AKI (acute kidney injury) ([place]); Fall at home, initial encounter; and Paroxysmal atrial fibrillation ([place]) on their problem list.  She is widowed. She has 1 biological child, 4 step children, 8 grandchildren and 1 great grandchild. She is a retired Event organiser.   Her husband of 31 years passed 01/04/2020 due to advanced cardiac disease. Now living by herself, feels she is doing well. Did get established with grief counselor, will continue to follow. Doesn't want daily med. She has xanax 0.25-0.5 mg which she takes only if unable to fall asleep after waking up in the middle of the night or if very anxious due to storm. Typically takes 0.25 mg, 3-4 days per week.   She is being followed by Dr. Angelena Form for paroxysmal atrial fibrillation.  Currently on Atenolol and Eliquis.  Significant history include Chronic LBP from severe thoracolumbar scoliosis s/p DDD fusion surgery x 3. Established with local provider and benefits from PT intermittently. She has continued with home exercises since covid 19.  She also underwent curative treatment for Hepatitis C (1987).   BMI is  There is no height or weight on file to calculate BMI., she was going to PT but stopped with husband illness, plans to restart, stretching/strenght training at home daily, is working on diet/portions, Weight goal <200 lb.  Wt Readings from Last 3 Encounters:  10/20/21 197 lb 9.6 oz (89.6 kg)  10/06/21 195 lb (88.5 kg)  09/27/21 199 lb 6.4 oz (90.4 kg)   She has had elevated blood pressure since 1996. Her blood pressure has been controlled at home, reports somewhat labile & today their BP is    BP Readings from Last 3 Encounters:  10/20/21 110/70  10/06/21 104/60  09/27/21 (!) 120/93   . She denies dizziness, CP, dyspnea, edema.   She does workout. She denies chest pain, shortness of breath, dizziness.  She has aortic atherosclerosis per CT 2018.  She is following with cardiology after ECHO in 2019 ordered due to exertional dyspnea demonstrated grade 2 diastolic dysfunction; BP and symptoms improved recently.   She denies dyspnea on exertion, orthopnea, paroxysmal nocturnal dyspnea and edema. Positive for none.  She is on cholesterol medication (rosuvastatin 40 mg daily, admits doesn't take regularly, perhaps 2-3 days per week) and denies myalgias. Her cholesterol is not at goal. The cholesterol last visit was:  Lab Results  Component Value Date   CHOL 212 (H) 06/02/2021   HDL 98 06/02/2021   LDLCALC 93 06/02/2021   TRIG 109 06/02/2021   CHOLHDL 2.2 06/02/2021   She had prediabetes in 2012. She has been working on diet and exercise for glucose management, and denies foot ulcerations, hyperglycemia, hypoglycemia , increased appetite, nausea, paresthesia of the feet, polydipsia, polyuria, visual disturbances, vomiting and weight loss. Last A1C in the office was:  Lab Results  Component Value Date   HGBA1C 5.2 06/02/2021   basecline CKD II, denies NSAID use, fluid intake varies, typically about 3 bottles/week.  Last GFR:  Lab Results  Component Value Date   GFRNONAA 55 (L)  11/07/2020   GFRNONAA 57 (L) 11/06/2020   GFRNONAA 52 (L) 11/06/2020   Patient is on Vitamin D supplement  Lab Results  Component Value Date   VD25OH 41 06/02/2021        Medication Review: Current Outpatient Medications on File Prior to Visit  Medication Sig   ALPRAZolam (XANAX) 0.5 MG tablet TAKE 1/2-1 TABLETS BY MOUTH TWO TIMES A DAY AS NEEDED FOR ANXIETY ATTACK OR SLEEP. LIMIT TO 5 DAYS PER WEEK TO AVOID ADDICTION AND DEMENTIA   apixaban (ELIQUIS) 5 MG TABS tablet Take 1 tablet (5 mg total) by mouth 2 (two) times daily.   atenolol (TENORMIN) 100 MG tablet Take 1 tablet (100 mg total) by mouth daily.   Cholecalciferol (VITAMIN D3) 50 MCG (2000 UT) capsule Take 2,000 Units by mouth daily.   dapagliflozin propanediol (FARXIGA) 10 MG TABS tablet Take 1 tablet by mouth daily. For kidney protection.   hydrochlorothiazide (MICROZIDE) 12.5 MG capsule Take 1 capsule (12.5 mg total) by mouth daily.   losartan (COZAAR) 50 MG tablet Take 1 tablet (50 mg total) by mouth daily.   nystatin (NYAMYC) powder APPLY DAILY AFTER A SHOWER AS DIRECTED AS NEEDED   Polyethyl Glycol-Propyl Glycol (SYSTANE) 0.4-0.3 % SOLN Apply 1 drop to eye 3 (three) times daily as needed (dry eyes).   potassium chloride (KLOR-CON) 10 MEQ tablet Take 1 tablet (10 mEq total) by mouth daily.   rosuvastatin (CRESTOR) 40 MG tablet  Take one tablet daily for cholesterol   senna (SENOKOT) 8.6 MG TABS tablet Take 1 tablet (8.6 mg total) by mouth daily as needed for mild constipation.   traZODone (DESYREL) 150 MG tablet Take 1/2 to 1 tablet   1 to 2 hours   before Bedtime as needed for Sleep (Patient not taking: Reported on 10/20/2021)   No current facility-administered medications on file prior to visit.    Current Problems (verified) Patient Active Problem List   Diagnosis Date Noted   Paroxysmal atrial fibrillation ([place]) 06/02/2021   Syncope 11/04/2020   AKI (acute kidney injury) ([place]) 11/04/2020   Fall at home, initial  encounter 11/04/2020   Persistent proteinuria 05/07/2020   B12 deficiency 05/04/2020   History of colon polyps 07/17/2019   Osteopenia 07/17/2019   Constipation 01/06/2019   Insomnia [id number]   Diastolic CHF ([place]) [id number]   Hepatic steatosis 12/26/2016   Abdominal aortic atherosclerosis ([place]) 12/26/2016   Morbid obesity ([place]) 10/15/2014   Essential hypertension 03/09/2013   Hyperlipidemia, mixed 03/09/2013   Abnormal glucose 03/09/2013   Vitamin D deficiency 03/09/2013   History of hepatitis C 03/09/2013    Screening Tests Immunization History  Administered Date(s) Administered   DTaP 03/06/2006   Influenza Split 11/20/2013, 12/24/2014   Influenza Whole 12/19/2012   Influenza, High Dose Seasonal PF 01/04/2017, 12/10/2017, 11/08/2018, 12/04/2019   PFIZER(Purple Top)SARS-COV-2 Vaccination 03/27/2019, 04/17/2019, 12/15/2019   Pneumococcal Conjugate-13 05/13/2014   Pneumococcal Polysaccharide-23 03/06/2008, 10/25/2016   Typhoid Live 03/06/2004   Zoster, Live 06/11/2007    Preventative care: Last colonoscopy: 10/2014, due 10/2019, referred to Dr. Watt Climes declines further [facility] 12/08/2019 [place] Declines further DEXA: 2010, 2018, 12/2019 - T -1.5 - improved from previous PAP 11/2014 DONE  Echo 2019 - grade 2 diastolic Stress test 0000000  Tdap: 2008, declines Influenza: 11/2019 Pneumonia: 2018 Prevnar: 2016 Shingles: 2009, not interested in shingrix Covid 19: 3/3, 2021, pfizer - has had booster  Names of Other Physician/Practitioners you currently use: 1. [facility] here for primary care 2. Dr. Jerline Pain, eye doctor, last visit 2021 3. Dr. Judd Lien, Dentist, last visit 2021, goes q30m 4. Dr. Delman Cheadle, derm, last 2021, had to reschedule - will see this year  Patient Care Team: Unk Pinto, MD as PCP - General (Internal Medicine) Burnell Blanks, MD as PCP - Cardiology (Cardiology) Richmond Campbell, MD as Consulting Physician  (Gastroenterology) Delila Pereyra, MD as Consulting Physician (Gynecology) Jari Pigg, MD as Consulting Physician (Dermatology) Chauncey Fischer, DO (Ophthalmology) Rehabilitation, Belinda Block as Physical Therapist (Physical Therapy)   Allergies Allergies  Allergen Reactions   Fentanyl Other (See Comments)    Intolerlance    SURGICAL HISTORY She  has a past surgical history that includes Back surgery (1986); Breast reconstruction; and Cataract extraction. FAMILY HISTORY Her family history includes Coronary artery disease in her brother; Hyperlipidemia in her father; Obesity in her brother. SOCIAL HISTORY She  reports that she quit smoking about 17 years ago. Her smoking use included cigarettes. She has a 30.00 pack-year smoking history. She has never used smokeless tobacco. She reports current alcohol use of about 14.0 standard drinks of alcohol per week. She reports that she does not use drugs.  Review of Systems  Constitutional:  Negative for malaise/fatigue and weight loss.  HENT:  Negative for hearing loss and tinnitus.   Eyes:  Negative for blurred vision and double vision.  Respiratory:  Negative for cough, sputum production, shortness of breath and wheezing.   Cardiovascular:  Negative  for chest pain, palpitations, orthopnea, claudication, leg swelling and PND.  Gastrointestinal:  Positive for constipation. Negative for abdominal pain, blood in stool, diarrhea, heartburn, melena, nausea and vomiting.  Genitourinary: Negative.   Musculoskeletal:  Negative for falls, joint pain and myalgias.  Skin:  Negative for rash.  Neurological:  Negative for dizziness, tingling, sensory change, weakness and headaches.  Endo/Heme/Allergies:  Negative for polydipsia.  Psychiatric/Behavioral: Negative.  Negative for depression, memory loss, substance abuse and suicidal ideas. The patient is not nervous/anxious and does not have insomnia.   All other systems reviewed and are  negative.    Objective:     There were no vitals taken for this visit.  General Appearance: Well nourished, in no apparent distress. Eyes: PERRLA, EOMs, conjunctiva no swelling or erythema Sinuses: No Frontal/maxillary tenderness ENT/Mouth: L Ext aud canals clear, TM without erythema, bulging. R canal obstructed by hard wax. No erythema, swelling, or exudate on post pharynx.  Tonsils not swollen or erythematous. Hearing normal.  Neck: Supple, thyroid normal.  Respiratory: Respiratory effort normal, BS equal bilaterally without rales, rhonchi, wheezing or stridor.  Cardio: RRR with no MRGs. Brisk peripheral pulses without edema.  Abdomen: Soft, + BS.  Non tender, no guarding, rebound, masses. She has non-tender easily reduced ventral hernia. Lymphatics: Non tender without lymphadenopathy.  Musculoskeletal: Full ROM, 5/5 strength, Normal gait Skin: Warm, dry without rashes, lesions, ecchymosis.  Neuro: Cranial nerves intact. No cerebellar symptoms.  Psych: Awake and oriented X 3, normal affect, Insight and Judgment appropriate.  Breasts:  breasts appear normal, no suspicious masses, no skin or nipple changes or axillary nodes, excepting symmetrical erythematous rash under left breast fold GU: defer   EKG: Sinus tachycardia, atrial fibrillation  Alycia Rossetti, NP   01/09/2022

## 2022-01-10 ENCOUNTER — Ambulatory Visit: Payer: Medicare PPO | Admitting: Nurse Practitioner

## 2022-01-10 DIAGNOSIS — I480 Paroxysmal atrial fibrillation: Secondary | ICD-10-CM

## 2022-01-10 DIAGNOSIS — K760 Fatty (change of) liver, not elsewhere classified: Secondary | ICD-10-CM

## 2022-01-10 DIAGNOSIS — R7309 Other abnormal glucose: Secondary | ICD-10-CM

## 2022-01-10 DIAGNOSIS — E782 Mixed hyperlipidemia: Secondary | ICD-10-CM

## 2022-01-10 DIAGNOSIS — I10 Essential (primary) hypertension: Secondary | ICD-10-CM

## 2022-01-10 DIAGNOSIS — I5030 Unspecified diastolic (congestive) heart failure: Secondary | ICD-10-CM

## 2022-01-10 DIAGNOSIS — I700 Atherosclerosis of aorta: Secondary | ICD-10-CM

## 2022-01-10 DIAGNOSIS — Z8619 Personal history of other infectious and parasitic diseases: Secondary | ICD-10-CM

## 2022-01-10 DIAGNOSIS — Z79899 Other long term (current) drug therapy: Secondary | ICD-10-CM

## 2022-01-10 DIAGNOSIS — M545 Low back pain, unspecified: Secondary | ICD-10-CM

## 2022-01-10 DIAGNOSIS — E559 Vitamin D deficiency, unspecified: Secondary | ICD-10-CM

## 2022-01-10 DIAGNOSIS — G4700 Insomnia, unspecified: Secondary | ICD-10-CM

## 2022-01-12 ENCOUNTER — Ambulatory Visit: Payer: Medicare PPO | Admitting: Physician Assistant

## 2022-01-12 NOTE — Progress Notes (Deleted)
Patient ID: Deanna Gomez, female   DOB: 20-Nov-1945, 76 y.o.   MRN: [id number]   3 MONTH FOLLOW UP  Assessment:    Paroxysmal Atrial Fibrillation Continue medication and follow with cardiology  Atherosclerosis of aorta Per CT 2018 Control blood pressure, cholesterol, glucose, increase exercise.   Diastolic CHF Grade 2 dysfunction, Weight stable/down, appears euvolemic Followed by cardiology  Essential hypertension - continue medications, DASH diet, exercise and monitor at home. Call if greater than 130/80.  - Go to the ER if any chest pain, shortness of breath, nausea, dizziness, severe HA, changes vision/speech  - CBC  Hepatitis C virus infection without hepatic coma, unspecified chronicity S/p treatment, in remission; monitor LFTs at routine OVs  Hepatic steatosis Weight loss advised, low processed carb diet, avoid alcohol/tylenol, will monitor LFTs  Hyperlipidemia -continue medications, check lipids, decrease fatty foods, increase activity.  -lipid panel - CMP  Other abnormal glucose (hx of prediabetes) Discussed general issues about diabetes pathophysiology and management., Educational material distributed., Suggested low cholesterol diet., Encouraged aerobic exercise., Discussed foot care., Reminded to get yearly retinal exam. - A1c  Morbid obesity -  Long discussion about weight loss, diet, and exercise Recommended diet heavy in fruits and veggies and low in animal meats, cheeses, and dairy products, appropriate calorie intake Patient will work on getting back on metabolism regulation diet and start working Discussed appropriate weight for height and initial goal (200lb) Follow up at next visit  Vitamin D deficiency At goal at recent check; continue to recommend supplementation for goal of 60-100 - Vitamin D level  Chronic midline back pain Hx of severe scoliosis with fusion 10+ years ago, sees a provider in [place].  Insomnia Uses xanax  PRN good sleep hygiene discussed, increase day time activity, try melatonin or benadryl        Future Appointments  Date Time Provider Department Center  01/16/2022  3:30 PM Raynelle Dick, NP [facility] None  06/05/2022  9:00 AM Raynelle Dick, NP [facility] None  12/20/2022 11:00 AM Adela Glimpse, NP [facility] None      Subjective:   Deanna Gomez is a 76 y.o. female who presents for 3 month follow up. She has Essential hypertension; Hyperlipidemia, mixed; Abnormal glucose; Vitamin D deficiency; History of hepatitis C; Morbid obesity (HCC); Hepatic steatosis; Abdominal aortic atherosclerosis (HCC); Diastolic CHF (HCC); Insomnia; Constipation; History of colon polyps; Osteopenia; B12 deficiency; Persistent proteinuria; Syncope; AKI (acute kidney injury) (HCC); Fall at home, initial encounter; and Paroxysmal atrial fibrillation (HCC) on their problem list.  She is widowed. She has 1 biological child, 4 step children, 8 grandchildren and 1 great grandchild. She is a retired Hospital doctor.   Her husband of 36 years passed 01/04/2020 due to advanced cardiac disease. Now living by herself, feels she is doing well. Did get established with grief counselor, will continue to follow. Doesn't want daily med. She has xanax 0.25-0.5 mg which she takes only if unable to fall asleep after waking up in the middle of the night or if very anxious due to storm. Typically takes 0.25 mg, 3-4 days per week.   She is being followed by Dr. Clifton James for paroxysmal atrial fibrillation.  Currently on Atenolol and Eliquis.  Significant history include Chronic LBP from severe thoracolumbar scoliosis s/p DDD fusion surgery x 3. Established with local provider and benefits from PT intermittently. She has continued with home exercises since covid 19.  She also underwent curative treatment for Hepatitis C (1987).  BMI is There is no height or weight on file to calculate BMI., she was  going to PT but stopped with husband illness, plans to restart, stretching/strenght training at home daily, is working on diet/portions, Weight goal <200 lb.  Wt Readings from Last 3 Encounters:  10/20/21 197 lb 9.6 oz (89.6 kg)  10/06/21 195 lb (88.5 kg)  09/27/21 199 lb 6.4 oz (90.4 kg)   She has had elevated blood pressure since 1996. Her blood pressure has been controlled at home, reports somewhat labile & today their BP is    BP Readings from Last 3 Encounters:  10/20/21 110/70  10/06/21 104/60  09/27/21 (!) 120/93   . She denies dizziness, CP, dyspnea, edema.   She does workout. She denies chest pain, shortness of breath, dizziness.  She has aortic atherosclerosis per CT 2018.  She is following with cardiology after ECHO in 2019 ordered due to exertional dyspnea demonstrated grade 2 diastolic dysfunction; BP and symptoms improved recently.   She denies dyspnea on exertion, orthopnea, paroxysmal nocturnal dyspnea and edema. Positive for none.  She is on cholesterol medication (rosuvastatin 40 mg daily, admits doesn't take regularly, perhaps 2-3 days per week) and denies myalgias. Her cholesterol is not at goal. The cholesterol last visit was:  Lab Results  Component Value Date   CHOL 212 (H) 06/02/2021   HDL 98 06/02/2021   LDLCALC 93 06/02/2021   TRIG 109 06/02/2021   CHOLHDL 2.2 06/02/2021   She had prediabetes in 2012. She has been working on diet and exercise for glucose management, and denies foot ulcerations, hyperglycemia, hypoglycemia , increased appetite, nausea, paresthesia of the feet, polydipsia, polyuria, visual disturbances, vomiting and weight loss. Last A1C in the office was:  Lab Results  Component Value Date   HGBA1C 5.2 06/02/2021   basecline CKD II, denies NSAID use, fluid intake varies, typically about 3 bottles/week.  Last GFR:  Lab Results  Component Value Date   GFRNONAA 55 (L) 11/07/2020   GFRNONAA 57 (L) 11/06/2020   GFRNONAA 52 (L) 11/06/2020    Patient is on Vitamin D supplement  Lab Results  Component Value Date   VD25OH 41 06/02/2021        Medication Review: Current Outpatient Medications on File Prior to Visit  Medication Sig   ALPRAZolam (XANAX) 0.5 MG tablet TAKE 1/2-1 TABLETS BY MOUTH TWO TIMES A DAY AS NEEDED FOR ANXIETY ATTACK OR SLEEP. LIMIT TO 5 DAYS PER WEEK TO AVOID ADDICTION AND DEMENTIA   apixaban (ELIQUIS) 5 MG TABS tablet Take 1 tablet (5 mg total) by mouth 2 (two) times daily.   atenolol (TENORMIN) 100 MG tablet Take 1 tablet (100 mg total) by mouth daily.   Cholecalciferol (VITAMIN D3) 50 MCG (2000 UT) capsule Take 2,000 Units by mouth daily.   dapagliflozin propanediol (FARXIGA) 10 MG TABS tablet Take 1 tablet by mouth daily. For kidney protection.   hydrochlorothiazide (MICROZIDE) 12.5 MG capsule Take 1 capsule (12.5 mg total) by mouth daily.   losartan (COZAAR) 50 MG tablet Take 1 tablet (50 mg total) by mouth daily.   nystatin (NYAMYC) powder APPLY DAILY AFTER A SHOWER AS DIRECTED AS NEEDED   Polyethyl Glycol-Propyl Glycol (SYSTANE) 0.4-0.3 % SOLN Apply 1 drop to eye 3 (three) times daily as needed (dry eyes).   potassium chloride (KLOR-CON) 10 MEQ tablet Take 1 tablet (10 mEq total) by mouth daily.   rosuvastatin (CRESTOR) 40 MG tablet Take one tablet daily for cholesterol   senna (SENOKOT) 8.6  MG TABS tablet Take 1 tablet (8.6 mg total) by mouth daily as needed for mild constipation.   traZODone (DESYREL) 150 MG tablet Take 1/2 to 1 tablet   1 to 2 hours   before Bedtime as needed for Sleep (Patient not taking: Reported on 10/20/2021)   No current facility-administered medications on file prior to visit.    Current Problems (verified) Patient Active Problem List   Diagnosis Date Noted   Paroxysmal atrial fibrillation (HCC) 06/02/2021   Syncope 11/04/2020   AKI (acute kidney injury) (HCC) 11/04/2020   Fall at home, initial encounter 11/04/2020   Persistent proteinuria 05/07/2020   B12  deficiency 05/04/2020   History of colon polyps 07/17/2019   Osteopenia 07/17/2019   Constipation 01/06/2019   Insomnia 07/04/2018   Diastolic CHF (HCC) 12/11/2017   Hepatic steatosis 12/26/2016   Abdominal aortic atherosclerosis (HCC) 12/26/2016   Morbid obesity (HCC) 10/15/2014   Essential hypertension 03/09/2013   Hyperlipidemia, mixed 03/09/2013   Abnormal glucose 03/09/2013   Vitamin D deficiency 03/09/2013   History of hepatitis C 03/09/2013    Screening Tests Immunization History  Administered Date(s) Administered   DTaP 03/06/2006   Influenza Split 11/20/2013, 12/24/2014   Influenza Whole 12/19/2012   Influenza, High Dose Seasonal PF 01/04/2017, 12/10/2017, 11/08/2018, 12/04/2019   PFIZER(Purple Top)SARS-COV-2 Vaccination 03/27/2019, 04/17/2019, 12/15/2019   Pneumococcal Conjugate-13 05/13/2014   Pneumococcal Polysaccharide-23 03/06/2008, 10/25/2016   Typhoid Live 03/06/2004   Zoster, Live 06/11/2007    Preventative care: Last colonoscopy: 10/2014, due 10/2019, referred to Dr. Ewing Schlein declines further [facility] 12/08/2019 [facility] Declines further DEXA: 2010, 2018, 12/2019 - T -1.5 - improved from previous PAP 11/2014 DONE  Echo 2019 - grade 2 diastolic Stress test 2013  Tdap: 2008, declines Influenza: 11/2019 Pneumonia: 2018 Prevnar: 2016 Shingles: 2009, not interested in shingrix Covid 19: 3/3, 2021, pfizer - has had booster  Names of Other Physician/Practitioners you currently use: 1. [facility] here for primary care 2. Dr. Jimmey Ralph, eye doctor, last visit 2021 3. Dr. Ardelle Anton, Dentist, last visit 2021, goes q76m 4. Dr. Emily Filbert, derm, last 2021, had to reschedule - will see this year  Patient Care Team: Lucky Cowboy, MD as PCP - General (Internal Medicine) [name], MD as PCP - Cardiology (Cardiology) Sharrell Ku, MD as Consulting Physician (Gastroenterology) Teodora Medici, MD as Consulting Physician  (Gynecology) Elmon Else, MD as Consulting Physician (Dermatology) [name], DO (Ophthalmology) Rehabilitation, Julien Girt as Physical Therapist (Physical Therapy)   Allergies Allergies  Allergen Reactions   Fentanyl Other (See Comments)    Intolerlance    SURGICAL HISTORY She  has a past surgical history that includes Back surgery (1986); Breast reconstruction; and Cataract extraction. FAMILY HISTORY Her family history includes Coronary artery disease in her brother; Hyperlipidemia in her father; Obesity in her brother. SOCIAL HISTORY She  reports that she quit smoking about 17 years ago. Her smoking use included cigarettes. She has a 30.00 pack-year smoking history. She has never used smokeless tobacco. She reports current alcohol use of about 14.0 standard drinks of alcohol per week. She reports that she does not use drugs.  Review of Systems  Constitutional:  Negative for malaise/fatigue and weight loss.  HENT:  Negative for hearing loss and tinnitus.   Eyes:  Negative for blurred vision and double vision.  Respiratory:  Negative for cough, sputum production, shortness of breath and wheezing.   Cardiovascular:  Negative for chest pain, palpitations, orthopnea, claudication, leg swelling and PND.  Gastrointestinal:  Positive for constipation. Negative for abdominal pain, blood in stool, diarrhea, heartburn, melena, nausea and vomiting.  Genitourinary: Negative.   Musculoskeletal:  Negative for falls, joint pain and myalgias.  Skin:  Negative for rash.  Neurological:  Negative for dizziness, tingling, sensory change, weakness and headaches.  Endo/Heme/Allergies:  Negative for polydipsia.  Psychiatric/Behavioral: Negative.  Negative for depression, memory loss, substance abuse and suicidal ideas. The patient is not nervous/anxious and does not have insomnia.   All other systems reviewed and are negative.    Objective:     There were no vitals taken for this  visit.  General Appearance: Well nourished, in no apparent distress. Eyes: PERRLA, EOMs, conjunctiva no swelling or erythema Sinuses: No Frontal/maxillary tenderness ENT/Mouth: L Ext aud canals clear, TM without erythema, bulging. R canal obstructed by hard wax. No erythema, swelling, or exudate on post pharynx.  Tonsils not swollen or erythematous. Hearing normal.  Neck: Supple, thyroid normal.  Respiratory: Respiratory effort normal, BS equal bilaterally without rales, rhonchi, wheezing or stridor.  Cardio: RRR with no MRGs. Brisk peripheral pulses without edema.  Abdomen: Soft, + BS.  Non tender, no guarding, rebound, masses. She has non-tender easily reduced ventral hernia. Lymphatics: Non tender without lymphadenopathy.  Musculoskeletal: Full ROM, 5/5 strength, Normal gait Skin: Warm, dry without rashes, lesions, ecchymosis.  Neuro: Cranial nerves intact. No cerebellar symptoms.  Psych: Awake and oriented X 3, normal affect, Insight and Judgment appropriate.  Breasts:  breasts appear normal, no suspicious masses, no skin or nipple changes or axillary nodes, excepting symmetrical erythematous rash under left breast fold GU: defer   EKG: Sinus tachycardia, atrial fibrillation  Raynelle Dick, NP   01/12/2022

## 2022-01-16 ENCOUNTER — Ambulatory Visit: Payer: Medicare PPO | Admitting: Nurse Practitioner

## 2022-01-16 DIAGNOSIS — Z8619 Personal history of other infectious and parasitic diseases: Secondary | ICD-10-CM

## 2022-01-16 DIAGNOSIS — E782 Mixed hyperlipidemia: Secondary | ICD-10-CM

## 2022-01-16 DIAGNOSIS — G4700 Insomnia, unspecified: Secondary | ICD-10-CM

## 2022-01-16 DIAGNOSIS — I10 Essential (primary) hypertension: Secondary | ICD-10-CM

## 2022-01-16 DIAGNOSIS — E559 Vitamin D deficiency, unspecified: Secondary | ICD-10-CM

## 2022-01-16 DIAGNOSIS — Z79899 Other long term (current) drug therapy: Secondary | ICD-10-CM

## 2022-01-16 DIAGNOSIS — I700 Atherosclerosis of aorta: Secondary | ICD-10-CM

## 2022-01-16 DIAGNOSIS — I5030 Unspecified diastolic (congestive) heart failure: Secondary | ICD-10-CM

## 2022-01-16 DIAGNOSIS — I480 Paroxysmal atrial fibrillation: Secondary | ICD-10-CM

## 2022-01-16 DIAGNOSIS — R7309 Other abnormal glucose: Secondary | ICD-10-CM

## 2022-01-23 NOTE — Progress Notes (Deleted)
Patient ID: Deanna Gomez, female   DOB: March 03, 1946, 76 y.o.   MRN: [id number]   3 MONTH FOLLOW UP  Assessment:    Paroxysmal Atrial Fibrillation Continue medication and follow with cardiology  Atherosclerosis of aorta Per CT 2018 Control blood pressure, cholesterol, glucose, increase exercise.   Diastolic CHF Grade 2 dysfunction, Weight stable/down, appears euvolemic Followed by cardiology  Essential hypertension - continue medications, DASH diet, exercise and monitor at home. Call if greater than 130/80.  - Go to the ER if any chest pain, shortness of breath, nausea, dizziness, severe HA, changes vision/speech  - CBC  Hepatitis C virus infection without hepatic coma, unspecified chronicity S/p treatment, in remission; monitor LFTs at routine OVs  Hepatic steatosis Weight loss advised, low processed carb diet, avoid alcohol/tylenol, will monitor LFTs  Hyperlipidemia -continue medications, check lipids, decrease fatty foods, increase activity.  -lipid panel - CMP  Other abnormal glucose (hx of prediabetes) Discussed general issues about diabetes pathophysiology and management., Educational material distributed., Suggested low cholesterol diet., Encouraged aerobic exercise., Discussed foot care., Reminded to get yearly retinal exam. - A1c  Morbid obesity -  Long discussion about weight loss, diet, and exercise Recommended diet heavy in fruits and veggies and low in animal meats, cheeses, and dairy products, appropriate calorie intake Patient will work on getting back on metabolism regulation diet and start working Discussed appropriate weight for height and initial goal (200lb) Follow up at next visit  Vitamin D deficiency At goal at recent check; continue to recommend supplementation for goal of 60-100 - Vitamin D level  Chronic midline back pain Hx of severe scoliosis with fusion 10+ years ago, sees a provider in [place].  Insomnia Uses xanax  PRN good sleep hygiene discussed, increase day time activity, try melatonin or benadryl        Future Appointments  Date Time Provider [place]  01/24/2022  3:30 PM Alycia Rossetti, NP [facility] None  06/05/2022  9:00 AM Alycia Rossetti, NP [facility] None  12/20/2022 11:00 AM Darrol Jump, NP [facility] None      Subjective:   Deanna Gomez is a 76 y.o. female who presents for 3 month follow up. She has Essential hypertension; Hyperlipidemia, mixed; Abnormal glucose; Vitamin D deficiency; History of hepatitis C; Morbid obesity ([name]); Hepatic steatosis; Abdominal aortic atherosclerosis ([name]); Diastolic CHF ([name]); Insomnia; Constipation; History of colon polyps; Osteopenia; B12 deficiency; Persistent proteinuria; Syncope; AKI (acute kidney injury) ([name]); Fall at home, initial encounter; and Paroxysmal atrial fibrillation ([name]) on their problem list.  She is widowed. She has 1 biological child, 4 step children, 8 grandchildren and 1 great grandchild. She is a retired Event organiser.   Her husband of 64 years passed 01/04/2020 due to advanced cardiac disease. Now living by herself, feels she is doing well. Did get established with grief counselor, will continue to follow. Doesn't want daily med. She has xanax 0.25-0.5 mg which she takes only if unable to fall asleep after waking up in the middle of the night or if very anxious due to storm. Typically takes 0.25 mg, 3-4 days per week.   She is being followed by Dr. Angelena Form for paroxysmal atrial fibrillation.  Currently on Atenolol and Eliquis.  Significant history include Chronic LBP from severe thoracolumbar scoliosis s/p DDD fusion surgery x 3. Established with local provider and benefits from PT intermittently. She has continued with home exercises since covid 19.  She also underwent curative treatment for Hepatitis C (1987).  BMI is There is no height or weight on file to calculate BMI., she was  going to PT but stopped with husband illness, plans to restart, stretching/strenght training at home daily, is working on diet/portions, Weight goal <200 lb.  Wt Readings from Last 3 Encounters:  10/20/21 197 lb 9.6 oz (89.6 kg)  10/06/21 195 lb (88.5 kg)  09/27/21 199 lb 6.4 oz (90.4 kg)   She has had elevated blood pressure since 1996. Her blood pressure has been controlled at home, reports somewhat labile & today their BP is    BP Readings from Last 3 Encounters:  10/20/21 110/70  10/06/21 104/60  09/27/21 (!) 120/93   . She denies dizziness, CP, dyspnea, edema.   She does workout. She denies chest pain, shortness of breath, dizziness.  She has aortic atherosclerosis per CT 2018.  She is following with cardiology after ECHO in 2019 ordered due to exertional dyspnea demonstrated grade 2 diastolic dysfunction; BP and symptoms improved recently.   She denies dyspnea on exertion, orthopnea, paroxysmal nocturnal dyspnea and edema. Positive for none.  She is on cholesterol medication (rosuvastatin 40 mg daily, admits doesn't take regularly, perhaps 2-3 days per week) and denies myalgias. Her cholesterol is not at goal. The cholesterol last visit was:  Lab Results  Component Value Date   CHOL 212 (H) 06/02/2021   HDL 98 06/02/2021   LDLCALC 93 06/02/2021   TRIG 109 06/02/2021   CHOLHDL 2.2 06/02/2021   She had prediabetes in 2012. She has been working on diet and exercise for glucose management, and denies foot ulcerations, hyperglycemia, hypoglycemia , increased appetite, nausea, paresthesia of the feet, polydipsia, polyuria, visual disturbances, vomiting and weight loss. Last A1C in the office was:  Lab Results  Component Value Date   HGBA1C 5.2 06/02/2021   basecline CKD II, denies NSAID use, fluid intake varies, typically about 3 bottles/week.  Last GFR:  Lab Results  Component Value Date   GFRNONAA 55 (L) 11/07/2020   GFRNONAA 57 (L) 11/06/2020   GFRNONAA 52 (L) 11/06/2020    Patient is on Vitamin D supplement  Lab Results  Component Value Date   VD25OH 41 06/02/2021        Medication Review: Current Outpatient Medications on File Prior to Visit  Medication Sig   ALPRAZolam (XANAX) 0.5 MG tablet TAKE 1/2-1 TABLETS BY MOUTH TWO TIMES A DAY AS NEEDED FOR ANXIETY ATTACK OR SLEEP. LIMIT TO 5 DAYS PER WEEK TO AVOID ADDICTION AND DEMENTIA   apixaban (ELIQUIS) 5 MG TABS tablet Take 1 tablet (5 mg total) by mouth 2 (two) times daily.   atenolol (TENORMIN) 100 MG tablet Take 1 tablet (100 mg total) by mouth daily.   Cholecalciferol (VITAMIN D3) 50 MCG (2000 UT) capsule Take 2,000 Units by mouth daily.   dapagliflozin propanediol (FARXIGA) 10 MG TABS tablet Take 1 tablet by mouth daily. For kidney protection.   hydrochlorothiazide (MICROZIDE) 12.5 MG capsule Take 1 capsule (12.5 mg total) by mouth daily.   losartan (COZAAR) 50 MG tablet Take 1 tablet (50 mg total) by mouth daily.   nystatin (NYAMYC) powder APPLY DAILY AFTER A SHOWER AS DIRECTED AS NEEDED   Polyethyl Glycol-Propyl Glycol (SYSTANE) 0.4-0.3 % SOLN Apply 1 drop to eye 3 (three) times daily as needed (dry eyes).   potassium chloride (KLOR-CON) 10 MEQ tablet Take 1 tablet (10 mEq total) by mouth daily.   rosuvastatin (CRESTOR) 40 MG tablet Take one tablet daily for cholesterol   senna (SENOKOT) 8.6  MG TABS tablet Take 1 tablet (8.6 mg total) by mouth daily as needed for mild constipation.   traZODone (DESYREL) 150 MG tablet Take 1/2 to 1 tablet   1 to 2 hours   before Bedtime as needed for Sleep (Patient not taking: Reported on 10/20/2021)   No current facility-administered medications on file prior to visit.    Current Problems (verified) Patient Active Problem List   Diagnosis Date Noted   Paroxysmal atrial fibrillation (HCC) 06/02/2021   Syncope 11/04/2020   AKI (acute kidney injury) (HCC) 11/04/2020   Fall at home, initial encounter 11/04/2020   Persistent proteinuria 05/07/2020   B12  deficiency 05/04/2020   History of colon polyps 07/17/2019   Osteopenia 07/17/2019   Constipation 01/06/2019   Insomnia 07/04/2018   Diastolic CHF (HCC) 12/11/2017   Hepatic steatosis 12/26/2016   Abdominal aortic atherosclerosis (HCC) 12/26/2016   Morbid obesity (HCC) 10/15/2014   Essential hypertension 03/09/2013   Hyperlipidemia, mixed 03/09/2013   Abnormal glucose 03/09/2013   Vitamin D deficiency 03/09/2013   History of hepatitis C 03/09/2013    Screening Tests Immunization History  Administered Date(s) Administered   DTaP 03/06/2006   Influenza Split 11/20/2013, 12/24/2014   Influenza Whole 12/19/2012   Influenza, High Dose Seasonal PF 01/04/2017, 12/10/2017, 11/08/2018, 12/04/2019   PFIZER(Purple Top)SARS-COV-2 Vaccination 03/27/2019, 04/17/2019, 12/15/2019   Pneumococcal Conjugate-13 05/13/2014   Pneumococcal Polysaccharide-23 03/06/2008, 10/25/2016   Typhoid Live 03/06/2004   Zoster, Live 06/11/2007    Preventative care: Last colonoscopy: 10/2014, due 10/2019, referred to Dr. Ewing Schlein declines further [facility] 12/08/2019 [place] Declines further DEXA: 2010, 2018, 12/2019 - T -1.5 - improved from previous PAP 11/2014 DONE  Echo 2019 - grade 2 diastolic Stress test 2013  Tdap: 2008, declines Influenza: 11/2019 Pneumonia: 2018 Prevnar: 2016 Shingles: 2009, not interested in shingrix Covid 19: 3/3, 2021, pfizer - has had booster  Names of Other Physician/Practitioners you currently use: 1. [facility] here for primary care 2. Dr. Jimmey Ralph, eye doctor, last visit 2021 3. Dr. Ardelle Anton, Dentist, last visit 2021, goes q76m 4. Dr. Emily Filbert, derm, last 2021, had to reschedule - will see this year  Patient Care Team: Lucky Cowboy, MD as PCP - General (Internal Medicine) [name], MD as PCP - Cardiology (Cardiology) Sharrell Ku, MD as Consulting Physician (Gastroenterology) Teodora Medici, MD as Consulting Physician  (Gynecology) Elmon Else, MD as Consulting Physician (Dermatology) [name], DO (Ophthalmology) Rehabilitation, Julien Girt as Physical Therapist (Physical Therapy)   Allergies Allergies  Allergen Reactions   Fentanyl Other (See Comments)    Intolerlance    SURGICAL HISTORY She  has a past surgical history that includes Back surgery (1986); Breast reconstruction; and Cataract extraction. FAMILY HISTORY Her family history includes Coronary artery disease in her brother; Hyperlipidemia in her father; Obesity in her brother. SOCIAL HISTORY She  reports that she quit smoking about 17 years ago. Her smoking use included cigarettes. She has a 30.00 pack-year smoking history. She has never used smokeless tobacco. She reports current alcohol use of about 14.0 standard drinks of alcohol per week. She reports that she does not use drugs.  Review of Systems  Constitutional:  Negative for malaise/fatigue and weight loss.  HENT:  Negative for hearing loss and tinnitus.   Eyes:  Negative for blurred vision and double vision.  Respiratory:  Negative for cough, sputum production, shortness of breath and wheezing.   Cardiovascular:  Negative for chest pain, palpitations, orthopnea, claudication, leg swelling and PND.  Gastrointestinal:  Positive for constipation. Negative for abdominal pain, blood in stool, diarrhea, heartburn, melena, nausea and vomiting.  Genitourinary: Negative.   Musculoskeletal:  Negative for falls, joint pain and myalgias.  Skin:  Negative for rash.  Neurological:  Negative for dizziness, tingling, sensory change, weakness and headaches.  Endo/Heme/Allergies:  Negative for polydipsia.  Psychiatric/Behavioral: Negative.  Negative for depression, memory loss, substance abuse and suicidal ideas. The patient is not nervous/anxious and does not have insomnia.   All other systems reviewed and are negative.    Objective:     There were no vitals taken for this  visit.  General Appearance: Well nourished, in no apparent distress. Eyes: PERRLA, EOMs, conjunctiva no swelling or erythema Sinuses: No Frontal/maxillary tenderness ENT/Mouth: L Ext aud canals clear, TM without erythema, bulging. R canal obstructed by hard wax. No erythema, swelling, or exudate on post pharynx.  Tonsils not swollen or erythematous. Hearing normal.  Neck: Supple, thyroid normal.  Respiratory: Respiratory effort normal, BS equal bilaterally without rales, rhonchi, wheezing or stridor.  Cardio: RRR with no MRGs. Brisk peripheral pulses without edema.  Abdomen: Soft, + BS.  Non tender, no guarding, rebound, masses. She has non-tender easily reduced ventral hernia. Lymphatics: Non tender without lymphadenopathy.  Musculoskeletal: Full ROM, 5/5 strength, Normal gait Skin: Warm, dry without rashes, lesions, ecchymosis.  Neuro: Cranial nerves intact. No cerebellar symptoms.  Psych: Awake and oriented X 3, normal affect, Insight and Judgment appropriate.  Breasts:  breasts appear normal, no suspicious masses, no skin or nipple changes or axillary nodes, excepting symmetrical erythematous rash under left breast fold GU: defer   EKG: Sinus tachycardia, atrial fibrillation  Alycia Rossetti, NP   01/23/2022

## 2022-01-24 ENCOUNTER — Ambulatory Visit: Payer: Medicare PPO | Admitting: Nurse Practitioner

## 2022-01-24 DIAGNOSIS — I5032 Chronic diastolic (congestive) heart failure: Secondary | ICD-10-CM

## 2022-01-24 DIAGNOSIS — N1831 Chronic kidney disease, stage 3a: Secondary | ICD-10-CM

## 2022-01-24 DIAGNOSIS — I10 Essential (primary) hypertension: Secondary | ICD-10-CM

## 2022-01-24 DIAGNOSIS — R7309 Other abnormal glucose: Secondary | ICD-10-CM

## 2022-01-24 DIAGNOSIS — K760 Fatty (change of) liver, not elsewhere classified: Secondary | ICD-10-CM

## 2022-01-24 DIAGNOSIS — G4700 Insomnia, unspecified: Secondary | ICD-10-CM

## 2022-01-24 DIAGNOSIS — E782 Mixed hyperlipidemia: Secondary | ICD-10-CM

## 2022-01-24 DIAGNOSIS — G8929 Other chronic pain: Secondary | ICD-10-CM

## 2022-01-24 DIAGNOSIS — E559 Vitamin D deficiency, unspecified: Secondary | ICD-10-CM

## 2022-01-24 DIAGNOSIS — Z79899 Other long term (current) drug therapy: Secondary | ICD-10-CM

## 2022-01-24 DIAGNOSIS — I480 Paroxysmal atrial fibrillation: Secondary | ICD-10-CM

## 2022-01-24 DIAGNOSIS — I700 Atherosclerosis of aorta: Secondary | ICD-10-CM

## 2022-01-24 DIAGNOSIS — Z8619 Personal history of other infectious and parasitic diseases: Secondary | ICD-10-CM

## 2022-04-30 ENCOUNTER — Emergency Department (HOSPITAL_COMMUNITY): Payer: Medicare PPO

## 2022-04-30 ENCOUNTER — Other Ambulatory Visit: Payer: Self-pay

## 2022-04-30 ENCOUNTER — Emergency Department (HOSPITAL_COMMUNITY)
Admission: EM | Admit: 2022-04-30 | Discharge: 2022-04-30 | Disposition: A | Discharge status: Home / Self Care | Payer: Medicare PPO | Attending: Emergency Medicine | Admitting: Emergency Medicine

## 2022-04-30 DIAGNOSIS — I482 Chronic atrial fibrillation, unspecified: Secondary | ICD-10-CM | POA: Insufficient documentation

## 2022-04-30 DIAGNOSIS — S0990XA Unspecified injury of head, initial encounter: Secondary | ICD-10-CM | POA: Diagnosis not present

## 2022-04-30 DIAGNOSIS — R531 Weakness: Secondary | ICD-10-CM | POA: Diagnosis not present

## 2022-04-30 DIAGNOSIS — Z7901 Long term (current) use of anticoagulants: Secondary | ICD-10-CM | POA: Insufficient documentation

## 2022-04-30 LAB — CBC WITH DIFFERENTIAL/PLATELET
Abs Immature Granulocytes: 0.07 10*3/uL (ref 0.00–0.07)
Basophils Absolute: 0 10*3/uL (ref 0.0–0.1)
Basophils Relative: 0 %
Eosinophils Absolute: 0 10*3/uL (ref 0.0–0.5)
Eosinophils Relative: 0 %
HCT: 42.7 % (ref 36.0–46.0)
Hemoglobin: 14.8 g/dL (ref 12.0–15.0)
Immature Granulocytes: 1 %
Lymphocytes Relative: 5 %
Lymphs Abs: 0.6 10*3/uL — ABNORMAL LOW (ref 0.7–4.0)
MCH: 37.4 pg — ABNORMAL HIGH (ref 26.0–34.0)
MCHC: 34.7 g/dL (ref 30.0–36.0)
MCV: 107.8 fL — ABNORMAL HIGH (ref 80.0–100.0)
Monocytes Absolute: 0.5 10*3/uL (ref 0.1–1.0)
Monocytes Relative: 4 %
Neutro Abs: 10.7 10*3/uL — ABNORMAL HIGH (ref 1.7–7.7)
Neutrophils Relative %: 90 %
Platelets: 194 10*3/uL (ref 150–400)
RBC: 3.96 MIL/uL (ref 3.87–5.11)
RDW: 14.7 % (ref 11.5–15.5)
WBC: 11.9 10*3/uL — ABNORMAL HIGH (ref 4.0–10.5)
nRBC: 0.2 % (ref 0.0–0.2)

## 2022-04-30 LAB — COMPREHENSIVE METABOLIC PANEL
ALT: 31 U/L (ref 0–44)
AST: 53 U/L — ABNORMAL HIGH (ref 15–41)
Albumin: 3.8 g/dL (ref 3.5–5.0)
Alkaline Phosphatase: 74 U/L (ref 38–126)
Anion gap: 14 (ref 5–15)
BUN: 18 mg/dL (ref 8–23)
CO2: 27 mmol/L (ref 22–32)
Calcium: 9.2 mg/dL (ref 8.9–10.3)
Chloride: 96 mmol/L — ABNORMAL LOW (ref 98–111)
Creatinine, Ser: 1.02 mg/dL — ABNORMAL HIGH (ref 0.44–1.00)
GFR, Estimated: 56 mL/min — ABNORMAL LOW (ref >60)
Glucose, Bld: 120 mg/dL — ABNORMAL HIGH (ref 70–99)
Potassium: 4.2 mmol/L (ref 3.5–5.1)
Sodium: 137 mmol/L (ref 135–145)
Total Bilirubin: 2.4 mg/dL — ABNORMAL HIGH (ref 0.3–1.2)
Total Protein: 7.1 g/dL (ref 6.5–8.1)

## 2022-04-30 LAB — URINALYSIS, ROUTINE W REFLEX MICROSCOPIC
Bacteria, UA: NONE SEEN
Bilirubin Urine: NEGATIVE
Glucose, UA: NEGATIVE mg/dL
Ketones, ur: NEGATIVE mg/dL
Leukocytes,Ua: NEGATIVE
Nitrite: NEGATIVE
Protein, ur: 100 mg/dL — AB
Specific Gravity, Urine: 1.015 (ref 1.005–1.030)
pH: 6 (ref 5.0–8.0)

## 2022-04-30 LAB — CK: Total CK: 45 U/L (ref 38–234)

## 2022-04-30 MED ORDER — ATENOLOL 50 MG PO TABS
100.0000 mg | ORAL_TABLET | Freq: Once | ORAL | Status: AC
  Administered 2022-04-30: 100 mg via ORAL
  Filled 2022-04-30: qty 2

## 2022-04-30 NOTE — ED Triage Notes (Signed)
Pt BIB [facility] from home. EMS reports initially called out for lifting assistance. Pt complaining only of cold feet. Denies other concerns.

## 2022-04-30 NOTE — Discharge Instructions (Addendum)
As we discussed, your workup in the ER today was reassuring for acute findings.  Laboratory evaluation did not reveal any emergent concerns.  We have placed orders for physical therapy, Occupational Therapy, and home health to come to your house to help you with any immediate needs you may have. Follow-up with your primary care doctor for any additional health needs.   Return if development of any new or worsening symptoms

## 2022-04-30 NOTE — ED Provider Notes (Signed)
[facility] Provider Note   CSN: [id number] Arrival date & time: 04/30/22  1510     History  Chief Complaint  Patient presents with   Cold Feet    Deanna Gomez is a 77 y.o. female, history of chronic A-fib, who presents to the ED secondary to an episode where she scooted down on the couch, and fell on her bottom today.  Daughter states she was crawling around for the last 3 hours, as she could not get up off the floor.  She was called, and they called EMS to get her off the floor, and they told her to come in.  Patient denies any head trauma, no confusion, no LOC nausea vomiting, shortness of breath, chest pain, urinary symptoms.  Daughter wants blood work to further evaluate patient.  She is agitated because patient has not been using walker at home as she is supposed to, and that she has not been taking her meds that she is supposed to.  Patient denies head trauma, no pain.     Home Medications Prior to Admission medications   Medication Sig Start Date End Date Taking? Authorizing Provider  ALPRAZolam (XANAX) 0.5 MG tablet TAKE 1/2-1 TABLETS BY MOUTH TWO TIMES A DAY AS NEEDED FOR ANXIETY ATTACK OR SLEEP. LIMIT TO 5 DAYS PER WEEK TO AVOID ADDICTION AND DEMENTIA 11/11/21   Alycia Rossetti, NP  apixaban (ELIQUIS) 5 MG TABS tablet Take 1 tablet (5 mg total) by mouth 2 (two) times daily. 09/28/21   Burnell Blanks, MD  atenolol (TENORMIN) 100 MG tablet Take 1 tablet (100 mg total) by mouth daily. 09/05/21   Darrol Jump, NP  Cholecalciferol (VITAMIN D3) 50 MCG (2000 UT) capsule Take 2,000 Units by mouth daily.    [provider]  dapagliflozin propanediol (FARXIGA) 10 MG TABS tablet Take 1 tablet by mouth daily. For kidney protection.    [provider]  hydrochlorothiazide (MICROZIDE) 12.5 MG capsule Take 1 capsule (12.5 mg total) by mouth daily. 10/20/21   Elgie Collard, PA-C  losartan (COZAAR) 50 MG tablet Take  1 tablet (50 mg total) by mouth daily. 10/20/21   Elgie Collard, PA-C  nystatin [facility]) powder APPLY DAILY AFTER A SHOWER AS DIRECTED AS NEEDED 06/08/21   Alycia Rossetti, NP  Polyethyl Glycol-Propyl Glycol (SYSTANE) 0.4-0.3 % SOLN Apply 1 drop to eye 3 (three) times daily as needed (dry eyes).    [provider]  potassium chloride (KLOR-CON) 10 MEQ tablet Take 1 tablet (10 mEq total) by mouth daily. 10/20/21   Elgie Collard, PA-C  rosuvastatin (CRESTOR) 40 MG tablet Take one tablet daily for cholesterol 10/26/21   Darrol Jump, NP  senna (SENOKOT) 8.6 MG TABS tablet Take 1 tablet (8.6 mg total) by mouth daily as needed for mild constipation. 11/07/20   Oswald Hillock, MD  traZODone (DESYREL) 150 MG tablet Take 1/2 to 1 tablet   1 to 2 hours   before Bedtime as needed for Sleep Patient not taking: Reported on 10/20/2021 08/16/21   Unk Pinto, MD      Allergies    Fentanyl    Review of Systems   Review of Systems  Respiratory:  Negative for shortness of breath.   Cardiovascular:  Negative for chest pain.    Physical Exam Updated Vital Signs BP (!) 131/119   Pulse (!) 126   Temp 97.7 F (36.5 C) (Oral)   Resp (!) 25  Ht '5\' 5"'$  (1.651 m)   Wt 88.5 kg   SpO2 97%   BMI 32.45 kg/m  Physical Exam Vitals and nursing note reviewed.  Constitutional:      General: She is not in acute distress.    Appearance: She is well-developed.  HENT:     Head: Normocephalic and atraumatic.  Eyes:     Conjunctiva/sclera: Conjunctivae normal.  Cardiovascular:     Rate and Rhythm: Tachycardia present. Rhythm irregular.     Heart sounds: No murmur heard. Pulmonary:     Effort: Pulmonary effort is normal. No respiratory distress.     Breath sounds: Normal breath sounds.  Abdominal:     Palpations: Abdomen is soft.     Tenderness: There is no abdominal tenderness.  Musculoskeletal:        General: No swelling.     Cervical back: Neck supple.  Skin:    General: Skin is warm and  dry.     Capillary Refill: Capillary refill takes less than 2 seconds.  Neurological:     General: No focal deficit present.     Mental Status: She is alert and oriented to person, place, and time.     Cranial Nerves: No cranial nerve deficit.     Sensory: No sensory deficit.     Comments: +generalized weakness  Psychiatric:        Mood and Affect: Mood normal.     ED Results / Procedures / Treatments   Labs (all labs ordered are listed, but only abnormal results are displayed) Labs Reviewed  CBC WITH DIFFERENTIAL/PLATELET - Abnormal; Notable for the following components:      Result Value   WBC 11.9 (*)    MCV 107.8 (*)    MCH 37.4 (*)    Neutro Abs 10.7 (*)    Lymphs Abs 0.6 (*)    All other components within normal limits  COMPREHENSIVE METABOLIC PANEL - Abnormal; Notable for the following components:   Chloride 96 (*)    Glucose, Bld 120 (*)    Creatinine, Ser 1.02 (*)    AST 53 (*)    Total Bilirubin 2.4 (*)    GFR, Estimated 56 (*)    All other components within normal limits  URINALYSIS, ROUTINE W REFLEX MICROSCOPIC  CK    EKG EKG Interpretation  Date/Time:  Sunday April 30 2022 17:24:00 EST Ventricular Rate:  116 PR Interval:    QRS Duration: 85 QT Interval:  320 QTC Calculation: 417 R Axis:   -68 Text Interpretation: Atrial fibrillation Left anterior fascicular block Low voltage, precordial leads Abnormal R-wave progression, early transition Borderline repolarization abnormality Minimal ST elevation, lateral leads Confirmed by Dene Gentry [phone]) on 04/30/2022 5:30:52 PM  Radiology No results found.  Procedures Procedures    Medications Ordered in ED Medications  atenolol (TENORMIN) tablet 100 mg (100 mg Oral Given 04/30/22 1821)    ED Course/ Medical Decision Making/ A&P                           Medical Decision Making Patient is a 77 year old female, here for generalized weakness, she states she fell to the ground today, by scooting  herself off the couch.  States she cannot get up afterwards.  Is on Eliquis however has not been compliant with her medications, she states that she just forgets to take them sometimes.  She is alert and oriented x 3.  She has strength of bilateral upper  and bilateral lower extremity 5/5.  She has no neurodeficits.  We will obtain a head CT as she is not a reliable historian, as well as a CBC, CMP urinalysis and EKG.  She denies any chest pain, shortness of breath or dizziness.  Daughter is able to stay with her tonight to increase and I sent the PT/OT/home health orders for home.   Amount and/or Complexity of Data Reviewed Labs: ordered. Radiology: ordered. ECG/medicine tests:     Details: A-fib 120s rate Discussion of management or test interpretation with external provider(s): In A-fib, chronically, she has not taken her meds today, we will give her A-fib medications, and monitor for improvement in rate, she will also obtain labs, labs pending, CT head pending, upon transfer to [facility], she will disposition the patient accordingly.  Likely patient will dispo home.  She has walker available at home.  Risk Prescription drug management.   Final Clinical Impression(s) / ED Diagnoses Final diagnoses:  Weakness    Rx / DC Orders ED Discharge Orders          Ordered    Home Health       Comments: Increased weakness,debility, lives alone   04/30/22 1809    Face-to-face encounter (required for Medicare/Medicaid patients)       Comments: I Osvaldo Shipper certify that this patient is under my care and that I, or a nurse practitioner or physician's assistant working with me, had a face-to-face encounter that meets the physician face-to-face encounter requirements with this patient on 04/30/2022. The encounter with the patient was in whole, or in part for the following medical condition(s) which is the primary reason for home health care (List medical condition): debility, worsening status, weakness    04/30/22 1809              Angellee Cohill, Si Gaul, PA 04/30/22 1844    Kemper Durie, DO 04/30/22 2241

## 2022-04-30 NOTE — ED Notes (Signed)
Patient used bedpan, spilled. Patient notified of need of urine specimen. Provider water.

## 2022-04-30 NOTE — ED Provider Notes (Signed)
Care assumed from [facility], PA-C at shift change.  Please see their note for further information.  HPI:   Deanna Gomez is a 77 y.o. female, history of chronic A-fib, who presents to the ED secondary to an episode where she scooted down on the couch, and fell on her bottom today. Daughter states she was crawling around for the last 3 hours, as she could not get up off the floor.  She was called, and they called EMS to get her off the floor, and they told her to come in. Patient denies any head trauma, no confusion, no LOC nausea vomiting, shortness of breath, chest pain, urinary symptoms.  Daughter wants blood work to further evaluate patient.  She is agitated because patient has not been using walker at home as she is supposed to, and that she has not been taking her meds that she is supposed to.  Patient denies head trauma, no pain.   Pf note, patient is anticoagulated with Elliquis.   Plan: Labs/UA, CT head. Afib RVR, patient has not taken her atenolol that she takes for rate control, given home meds for same. Likely home, home health PT/OT orders placed.   Labs resulted and reveal WBC 11.9, creatinine 1.02. CK normal, UA noninfectious.  Ct head resulted and reveals findings.  I personally reviewed and interpreted this imaging and agree with radiology interpretation.  Upon reassessment, patient's heart rate has normalized to the 90s.  She continues to be completely asymptomatic and she feels stable to return home per previous providers plan.  Home health and PT/OT consults placed.  Patient informed that they will likely call them tomorrow.  She is understanding and amenable with plan, educated on red flag symptoms that would prompt immediate return.  Patient discharged in stable condition.  Findings and plan of care discussed with supervising physician Dr. Theresia Lo who is in agreement.     Silva Bandy, PA-C 05/01/22 1215    Elayne Snare K, DO 05/03/22 [phone]

## 2022-05-03 ENCOUNTER — Encounter: Payer: Medicare PPO | Admitting: Nurse Practitioner

## 2022-05-05 ENCOUNTER — Telehealth: Payer: Self-pay

## 2022-05-05 NOTE — Telephone Encounter (Signed)
     Patient  visit on 2/26  at [place] you been able to follow up with your primary care physician? Yes   The patient was or was not able to obtain any needed medicine or equipment. Yes   Are there diet recommendations that you are having difficulty following? Na   Patient expresses understanding of discharge instructions and education provided has no other needs at this time.  Yes    [facility] [phone] [address], [place], [place] [address] Phone: [phone] Email: [email or web address]

## 2022-05-08 ENCOUNTER — Emergency Department (HOSPITAL_COMMUNITY): Payer: Medicare PPO

## 2022-05-08 ENCOUNTER — Encounter (HOSPITAL_COMMUNITY): Payer: Self-pay

## 2022-05-08 ENCOUNTER — Emergency Department (HOSPITAL_COMMUNITY)
Admission: EM | Admit: 2022-05-08 | Discharge: 2022-05-08 | Disposition: A | Discharge status: Home / Self Care | Payer: Medicare PPO | Attending: Emergency Medicine | Admitting: Emergency Medicine

## 2022-05-08 ENCOUNTER — Other Ambulatory Visit: Payer: Self-pay

## 2022-05-08 DIAGNOSIS — W19XXXA Unspecified fall, initial encounter: Secondary | ICD-10-CM

## 2022-05-08 DIAGNOSIS — Y900 Blood alcohol level of less than 20 mg/100 ml: Secondary | ICD-10-CM | POA: Insufficient documentation

## 2022-05-08 DIAGNOSIS — R0902 Hypoxemia: Secondary | ICD-10-CM | POA: Diagnosis not present

## 2022-05-08 DIAGNOSIS — Y92000 Kitchen of unspecified non-institutional (private) residence as  the place of occurrence of the external cause: Secondary | ICD-10-CM | POA: Insufficient documentation

## 2022-05-08 DIAGNOSIS — D72829 Elevated white blood cell count, unspecified: Secondary | ICD-10-CM | POA: Insufficient documentation

## 2022-05-08 DIAGNOSIS — I4891 Unspecified atrial fibrillation: Secondary | ICD-10-CM | POA: Diagnosis not present

## 2022-05-08 DIAGNOSIS — E876 Hypokalemia: Secondary | ICD-10-CM | POA: Diagnosis not present

## 2022-05-08 DIAGNOSIS — I509 Heart failure, unspecified: Secondary | ICD-10-CM | POA: Insufficient documentation

## 2022-05-08 DIAGNOSIS — T1490XA Injury, unspecified, initial encounter: Secondary | ICD-10-CM | POA: Diagnosis not present

## 2022-05-08 DIAGNOSIS — Z7901 Long term (current) use of anticoagulants: Secondary | ICD-10-CM | POA: Insufficient documentation

## 2022-05-08 DIAGNOSIS — R000 Tachycardia, unspecified: Secondary | ICD-10-CM | POA: Diagnosis not present

## 2022-05-08 DIAGNOSIS — I6782 Cerebral ischemia: Secondary | ICD-10-CM | POA: Diagnosis not present

## 2022-05-08 DIAGNOSIS — M545 Low back pain, unspecified: Secondary | ICD-10-CM | POA: Insufficient documentation

## 2022-05-08 DIAGNOSIS — Z043 Encounter for examination and observation following other accident: Secondary | ICD-10-CM | POA: Diagnosis not present

## 2022-05-08 DIAGNOSIS — R519 Headache, unspecified: Secondary | ICD-10-CM | POA: Diagnosis not present

## 2022-05-08 DIAGNOSIS — W1830XA Fall on same level, unspecified, initial encounter: Secondary | ICD-10-CM | POA: Insufficient documentation

## 2022-05-08 DIAGNOSIS — M549 Dorsalgia, unspecified: Secondary | ICD-10-CM | POA: Diagnosis not present

## 2022-05-08 LAB — COMPREHENSIVE METABOLIC PANEL
ALT: 31 U/L (ref 0–44)
AST: 59 U/L — ABNORMAL HIGH (ref 15–41)
Albumin: 3.3 g/dL — ABNORMAL LOW (ref 3.5–5.0)
Alkaline Phosphatase: 82 U/L (ref 38–126)
Anion gap: 14 (ref 5–15)
BUN: 17 mg/dL (ref 8–23)
CO2: 28 mmol/L (ref 22–32)
Calcium: 9.4 mg/dL (ref 8.9–10.3)
Chloride: 88 mmol/L — ABNORMAL LOW (ref 98–111)
Creatinine, Ser: 1.09 mg/dL — ABNORMAL HIGH (ref 0.44–1.00)
GFR, Estimated: 52 mL/min — ABNORMAL LOW (ref >60)
Glucose, Bld: 115 mg/dL — ABNORMAL HIGH (ref 70–99)
Potassium: 3 mmol/L — ABNORMAL LOW (ref 3.5–5.1)
Sodium: 130 mmol/L — ABNORMAL LOW (ref 135–145)
Total Bilirubin: 1.4 mg/dL — ABNORMAL HIGH (ref 0.3–1.2)
Total Protein: 6.8 g/dL (ref 6.5–8.1)

## 2022-05-08 LAB — CBC WITH DIFFERENTIAL/PLATELET
Abs Immature Granulocytes: 0.07 10*3/uL (ref 0.00–0.07)
Basophils Absolute: 0 10*3/uL (ref 0.0–0.1)
Basophils Relative: 0 %
Eosinophils Absolute: 0 10*3/uL (ref 0.0–0.5)
Eosinophils Relative: 0 %
HCT: 40.1 % (ref 36.0–46.0)
Hemoglobin: 14.4 g/dL (ref 12.0–15.0)
Immature Granulocytes: 1 %
Lymphocytes Relative: 5 %
Lymphs Abs: 0.6 10*3/uL — ABNORMAL LOW (ref 0.7–4.0)
MCH: 37.7 pg — ABNORMAL HIGH (ref 26.0–34.0)
MCHC: 35.9 g/dL (ref 30.0–36.0)
MCV: 105 fL — ABNORMAL HIGH (ref 80.0–100.0)
Monocytes Absolute: 0.9 10*3/uL (ref 0.1–1.0)
Monocytes Relative: 8 %
Neutro Abs: 10.1 10*3/uL — ABNORMAL HIGH (ref 1.7–7.7)
Neutrophils Relative %: 86 %
Platelets: 224 10*3/uL (ref 150–400)
RBC: 3.82 MIL/uL — ABNORMAL LOW (ref 3.87–5.11)
RDW: 14.6 % (ref 11.5–15.5)
WBC: 11.7 10*3/uL — ABNORMAL HIGH (ref 4.0–10.5)
nRBC: 0 % (ref 0.0–0.2)

## 2022-05-08 LAB — ETHANOL: Alcohol, Ethyl (B): <10 mg/dL (ref <10)

## 2022-05-08 MED ORDER — POTASSIUM CHLORIDE CRYS ER 20 MEQ PO TBCR
40.0000 meq | EXTENDED_RELEASE_TABLET | Freq: Once | ORAL | Status: AC
  Administered 2022-05-08: 40 meq via ORAL
  Filled 2022-05-08: qty 2

## 2022-05-08 MED ORDER — ATENOLOL 25 MG PO TABS
100.0000 mg | ORAL_TABLET | Freq: Once | ORAL | Status: AC
  Administered 2022-05-08: 100 mg via ORAL
  Filled 2022-05-08: qty 4

## 2022-05-08 NOTE — ED Notes (Signed)
Pt trying to provide urine

## 2022-05-08 NOTE — ED Provider Notes (Signed)
[facility] Provider Note   CSN: [id number] Arrival date & time: 05/08/22  [id number]     History  Chief Complaint  Patient presents with   Lytle Michaels    Deanna Gomez is a 77 y.o. female.  Patient with history of heart failure, acute kidney injury, paroxysmal A-fib on anticoagulation --presents to the emergency department today after having a fall.  She states that she was staying up all night watching TV.  She states that she walked to her kitchen to make something to eat when she "crumpled".  She fell to the ground onto her right side.  She was unable to get up so she called the ambulance.  She was then transported to the hospital.  C-collar was placed at home but she does not think that she hit her head.  She denies loss of consciousness.  She lives by herself.  Currently complains of right sided lower back and posterior hip pain.  Pain is worse with movement.  No treatments prior to arrival.       Home Medications Prior to Admission medications   Medication Sig Start Date End Date Taking? Authorizing Provider  ALPRAZolam (XANAX) 0.5 MG tablet TAKE 1/2-1 TABLETS BY MOUTH TWO TIMES A DAY AS NEEDED FOR ANXIETY ATTACK OR SLEEP. LIMIT TO 5 DAYS PER WEEK TO AVOID ADDICTION AND DEMENTIA 11/11/21   Alycia Rossetti, NP  apixaban (ELIQUIS) 5 MG TABS tablet Take 1 tablet (5 mg total) by mouth 2 (two) times daily. 09/28/21   Burnell Blanks, MD  atenolol (TENORMIN) 100 MG tablet Take 1 tablet (100 mg total) by mouth daily. 09/05/21   Darrol Jump, NP  Cholecalciferol (VITAMIN D3) 50 MCG (2000 UT) capsule Take 2,000 Units by mouth daily.    [provider]  dapagliflozin propanediol (FARXIGA) 10 MG TABS tablet Take 1 tablet by mouth daily. For kidney protection.    [provider]  hydrochlorothiazide (MICROZIDE) 12.5 MG capsule Take 1 capsule (12.5 mg total) by mouth daily. 10/20/21   Elgie Collard, PA-C  losartan (COZAAR) 50 MG  tablet Take 1 tablet (50 mg total) by mouth daily. 10/20/21   Elgie Collard, PA-C  nystatin [facility]) powder APPLY DAILY AFTER A SHOWER AS DIRECTED AS NEEDED 06/08/21   Alycia Rossetti, NP  Polyethyl Glycol-Propyl Glycol (SYSTANE) 0.4-0.3 % SOLN Apply 1 drop to eye 3 (three) times daily as needed (dry eyes).    [provider]  potassium chloride (KLOR-CON) 10 MEQ tablet Take 1 tablet (10 mEq total) by mouth daily. 10/20/21   Elgie Collard, PA-C  rosuvastatin (CRESTOR) 40 MG tablet Take one tablet daily for cholesterol 10/26/21   Darrol Jump, NP  senna (SENOKOT) 8.6 MG TABS tablet Take 1 tablet (8.6 mg total) by mouth daily as needed for mild constipation. 11/07/20   Oswald Hillock, MD  traZODone (DESYREL) 150 MG tablet Take 1/2 to 1 tablet   1 to 2 hours   before Bedtime as needed for Sleep Patient not taking: Reported on 10/20/2021 08/16/21   Unk Pinto, MD      Allergies    Fentanyl    Review of Systems   Review of Systems  Physical Exam Updated Vital Signs BP (!) 145/112 (BP Location: Right Arm)   Pulse (!) 128   Temp 98.7 F (37.1 C)   Resp 20   Ht '5\' 6"'$  (1.676 m)   Wt 72.5 kg   SpO2 97%  BMI 25.80 kg/m   Physical Exam Vitals and nursing note reviewed.  Constitutional:      Appearance: She is well-developed.  HENT:     Head: Normocephalic and atraumatic. No raccoon eyes or Battle's sign.     Right Ear: Tympanic membrane, ear canal and external ear normal. No hemotympanum.     Left Ear: Tympanic membrane, ear canal and external ear normal. No hemotympanum.     Nose: Nose normal.     Mouth/Throat:     Pharynx: Uvula midline.  Eyes:     General: Lids are normal.     Extraocular Movements:     Right eye: No nystagmus.     Left eye: No nystagmus.     Conjunctiva/sclera: Conjunctivae normal.     Pupils: Pupils are equal, round, and reactive to light.     Comments: No visible hyphema noted  Cardiovascular:     Rate and Rhythm: Tachycardia present. Rhythm  irregularly irregular.  Pulmonary:     Effort: Pulmonary effort is normal.     Breath sounds: Normal breath sounds.  Abdominal:     Palpations: Abdomen is soft.     Tenderness: There is no abdominal tenderness. There is no guarding or rebound.  Musculoskeletal:     Cervical back: Normal range of motion and neck supple. No tenderness or bony tenderness.     Thoracic back: No tenderness or bony tenderness.     Lumbar back: Tenderness present. No bony tenderness.       Back:     Right hip: No bony tenderness. Normal range of motion.     Left hip: No bony tenderness. Normal range of motion.     Right knee: Normal range of motion. No tenderness.     Left knee: Normal range of motion. No tenderness.  Skin:    General: Skin is warm and dry.  Neurological:     Mental Status: She is alert and oriented to person, place, and time.     GCS: GCS eye subscore is 4. GCS verbal subscore is 5. GCS motor subscore is 6.     Cranial Nerves: No cranial nerve deficit.     Sensory: No sensory deficit.     Coordination: Coordination normal.     Deep Tendon Reflexes: Reflexes are normal and symmetric.     ED Results / Procedures / Treatments   Labs (all labs ordered are listed, but only abnormal results are displayed) Labs Reviewed  CBC WITH DIFFERENTIAL/PLATELET - Abnormal; Notable for the following components:      Result Value   WBC 11.7 (*)    RBC 3.82 (*)    MCV 105.0 (*)    MCH 37.7 (*)    Neutro Abs 10.1 (*)    Lymphs Abs 0.6 (*)    All other components within normal limits  COMPREHENSIVE METABOLIC PANEL - Abnormal; Notable for the following components:   Sodium 130 (*)    Potassium 3.0 (*)    Chloride 88 (*)    Glucose, Bld 115 (*)    Creatinine, Ser 1.09 (*)    Albumin 3.3 (*)    AST 59 (*)    Total Bilirubin 1.4 (*)    GFR, Estimated 52 (*)    All other components within normal limits  ETHANOL  URINALYSIS, ROUTINE W REFLEX MICROSCOPIC    EKG None  Radiology DG Lumbar  Spine Complete  Result Date: 05/08/2022 CLINICAL DATA:  Fall, back pain EXAM: LUMBAR SPINE - COMPLETE 4+  VIEW COMPARISON:  01/03/2017 FINDINGS: Extensive postsurgical changes of the thoracolumbar spine with fusion hardware extending from the T12 to S2 levels. The posterior fusion rod on the left at L5 is chronically fractured. Hardware appears otherwise intact. There is solid bony fusion throughout the fused levels. Bones appear demineralized. There is no evidence of an acute fracture or traumatic listhesis. Aortic atherosclerosis. IMPRESSION: 1. No evidence of acute fracture or traumatic listhesis of the lumbar spine. 2. Extensive postsurgical changes from spinal fusion. The posterior fusion rod on the left at L5 is chronically fractured. Electronically Signed   By: Davina Poke D.O.   On: 05/08/2022 09:31   DG Hip Unilat W or Wo Pelvis 2-3 Views Right  Result Date: 05/08/2022 CLINICAL DATA:  Fall.  Posterior pelvis and lower back pain. EXAM: DG HIP (WITH OR WITHOUT PELVIS) 2-3V RIGHT COMPARISON:  CT abdomen and pelvis 01/04/2017 FINDINGS: There is diffuse decreased bone mineralization. The right femoroacetabular joint space is maintained. On frontal view of the pelvis, there is mild bilateral sacroiliac subchondral sclerosis. The left femoroacetabular joint space is maintained. Mild bilateral superolateral acetabular and mild superior pubic symphysis degenerative spurring. Partial visualization of lumbar spine and sacral posterior fusion hardware. Bilateral tubal ligation clips are noted. IMPRESSION: 1. No acute fracture. 2. Mild bilateral sacroiliac osteoarthritis. Electronically Signed   By: Yvonne Kendall M.D.   On: 05/08/2022 09:31   CT HEAD WO CONTRAST (5MM)  Result Date: 05/08/2022 CLINICAL DATA:  Trauma EXAM: CT HEAD WITHOUT CONTRAST CT CERVICAL SPINE WITHOUT CONTRAST TECHNIQUE: Multidetector CT imaging of the head and cervical spine was performed following the standard protocol without  intravenous contrast. Multiplanar CT image reconstructions of the cervical spine were also generated. RADIATION DOSE REDUCTION: This exam was performed according to the departmental dose-optimization program which includes automated exposure control, adjustment of the mA and/or kV according to patient size and/or use of iterative reconstruction technique. COMPARISON:  CT Head 04/30/22 FINDINGS: CT HEAD FINDINGS Brain: No evidence of acute infarction, hemorrhage, hydrocephalus, extra-axial collection or mass lesion/mass effect. There is sequela of severe chronic microvascular ischemic change Vascular: No hyperdense vessel or unexpected calcification. Skull: Normal. Negative for fracture or focal lesion. Sinuses/Orbits: No mastoid or middle ear effusion. Paranasal sinuses are clear. Bilateral lens replacement. Orbits are otherwise unremarkable. Other: None. CT CERVICAL SPINE FINDINGS Alignment: There is reversal of the normal cervical lordosis centered at the C4 vertebral body level. There is grade 1 anterolisthesis of C3 on C4. Skull base and vertebrae: No acute fracture. No primary bone lesion or focal pathologic process. Soft tissues and spinal canal: No prevertebral fluid or swelling. No visible canal hematoma. Disc levels:  No evidence of high-grade spinal canal stenosis. Upper chest: Negative. Other: The right common carotid artery is medialized and causes mass effect on the posterior aspect of the pharynx. IMPRESSION: 1. No CT evidence of intracranial injury. 2. Sequela of severe chronic microvascular ischemic change. 3. No acute fracture or traumatic malalignment of the cervical spine. Electronically Signed   By: Marin Roberts M.D.   On: 05/08/2022 09:28   CT Cervical Spine Wo Contrast  Result Date: 05/08/2022 CLINICAL DATA:  Trauma EXAM: CT HEAD WITHOUT CONTRAST CT CERVICAL SPINE WITHOUT CONTRAST TECHNIQUE: Multidetector CT imaging of the head and cervical spine was performed following the standard protocol  without intravenous contrast. Multiplanar CT image reconstructions of the cervical spine were also generated. RADIATION DOSE REDUCTION: This exam was performed according to the departmental dose-optimization program which includes automated exposure control,  adjustment of the mA and/or kV according to patient size and/or use of iterative reconstruction technique. COMPARISON:  CT Head 04/30/22 FINDINGS: CT HEAD FINDINGS Brain: No evidence of acute infarction, hemorrhage, hydrocephalus, extra-axial collection or mass lesion/mass effect. There is sequela of severe chronic microvascular ischemic change Vascular: No hyperdense vessel or unexpected calcification. Skull: Normal. Negative for fracture or focal lesion. Sinuses/Orbits: No mastoid or middle ear effusion. Paranasal sinuses are clear. Bilateral lens replacement. Orbits are otherwise unremarkable. Other: None. CT CERVICAL SPINE FINDINGS Alignment: There is reversal of the normal cervical lordosis centered at the C4 vertebral body level. There is grade 1 anterolisthesis of C3 on C4. Skull base and vertebrae: No acute fracture. No primary bone lesion or focal pathologic process. Soft tissues and spinal canal: No prevertebral fluid or swelling. No visible canal hematoma. Disc levels:  No evidence of high-grade spinal canal stenosis. Upper chest: Negative. Other: The right common carotid artery is medialized and causes mass effect on the posterior aspect of the pharynx. IMPRESSION: 1. No CT evidence of intracranial injury. 2. Sequela of severe chronic microvascular ischemic change. 3. No acute fracture or traumatic malalignment of the cervical spine. Electronically Signed   By: Marin Roberts M.D.   On: 05/08/2022 09:28    Procedures Procedures    Medications Ordered in ED Medications - No data to display  ED Course/ Medical Decision Making/ A&P    Patient seen and examined. History obtained directly from patient.  I have reviewed previous ED evaluations  for near syncope.  She seems to be overall a poor historian and cannot give me details about her medical history and current medications.  Labs/EKG: Ordered CBC, CMP, UA, EKG.  Imaging: Ordered CT head and cervical spine, x-ray of the lumbar spine and right hip and pelvis.  Medications/Fluids: atenolol '100mg'$  PO  Most recent vital signs reviewed and are as follows: BP (!) 145/112 (BP Location: Right Arm)   Pulse (!) 128   Temp 98.7 F (37.1 C)   Resp 20   Ht '5\' 6"'$  (1.676 m)   Wt 72.5 kg   SpO2 97%   BMI 25.80 kg/m   Initial impression: Fall, atrial fibrillation with RVR.     Reassessment performed. Patient appears stable.  Labs personally reviewed and interpreted including: CBC with diff WBC elevated 11.7, hemoglobin; CMP hyponatremia 130, potassium low 3.0, chloride 88; ethanol <10.   Reviewed pertinent lab work and imaging with patient at bedside. Questions answered.   Plan: PO potassium.  3:40 PM Reassessment performed. Patient appears stable.  Sitting in the hallway.  She is ambulating, appears very comfortable.  We were awaiting urine testing due to weakness this morning, however patient is refusing to provide a sample.  She is requesting discharge to home.  No indication to hold her further at this time.  Reviewed pertinent lab work and imaging with patient at bedside. Questions answered.   Most current vital signs reviewed and are as follows: BP 104/72   Pulse 98   Temp 98.2 F (36.8 C)   Resp 18   Ht '5\' 6"'$  (1.676 m)   Wt 72.5 kg   SpO2 99%   BMI 25.80 kg/m   Plan: Discharge to home.   Prescriptions written for: None  Other home care instructions discussed: Rest, hydration  ED return instructions discussed: Worsening symptoms or other concerns.  Follow-up instructions discussed: Patient encouraged to follow-up with their PCP in 3 days for recheck of symptoms and recheck of potassium and electrolytes.  Medical Decision  Making Amount and/or Complexity of Data Reviewed Labs: ordered. Radiology: ordered.  Risk Prescription drug management.   Patient with generalized weakness and fall.  She was evaluated for head and neck injury, imaging negative.  She had pain in her hip and lower back, x-rays were negative.  Patient has ambulated.  She was also in A-fib with RVR.  She did well with her home medication and her heart rate was much improved.  Patient did have hypokalemia and was given oral repletion.  She does not appear to be severely dehydrated.  She is tolerating fluids.  She is requesting discharge.  The patient's vital signs, pertinent lab work and imaging were reviewed and interpreted as discussed in the ED course. Hospitalization was considered for further testing, treatments, or serial exams/observation. However as patient is well-appearing, has a stable exam, and reassuring studies today, I do not feel that they warrant admission at this time. This plan was discussed with the patient who verbalizes agreement and comfort with this plan and seems reliable and able to return to the Emergency Department with worsening or changing symptoms.          Final Clinical Impression(s) / ED Diagnoses Final diagnoses:  Fall, initial encounter  Atrial fibrillation with RVR ([place])  Hypokalemia    Rx / DC Orders ED Discharge Orders     None         Carlisle Cater, PA-C 05/08/22 1549    Fransico Meadow, MD 05/08/22 [phone]

## 2022-05-08 NOTE — Discharge Instructions (Addendum)
Please read and follow all provided instructions.  Your diagnoses today include:  1. Fall, initial encounter   2. Atrial fibrillation with RVR ([facility])     Tests performed today include: Blood cell counts and electrolytes: Your potassium was low Alcohol level: Was negative CT head and neck: no serious injury X-ray of your hip and lower back: No sign of fracture or other serious injury Vital signs. See below for your results today.   Medications prescribed:  None  Take any prescribed medications only as directed.  Home care instructions:  Follow any educational materials contained in this packet.  BE VERY CAREFUL not to take multiple medicines containing Tylenol (also called acetaminophen). Doing so can lead to an overdose which can damage your liver and cause liver failure and possibly death.   Follow-up instructions: Please follow-up with your primary care provider in the next 3 days for further evaluation of your symptoms.   Return instructions:  Please return to the Emergency Department if you experience worsening symptoms.  Please return if you have any other emergent concerns.  Additional Information:  Your vital signs today were: BP 104/72   Pulse 98   Temp 98.2 F (36.8 C)   Resp 18   Ht '5\' 6"'$  (1.676 m)   Wt 72.5 kg   SpO2 99%   BMI 25.80 kg/m  If your blood pressure (BP) was elevated above 135/85 this visit, please have this repeated by your doctor within one month. --------------

## 2022-05-08 NOTE — ED Notes (Signed)
Pt sitting up in chair states that she is fine and wants to go home

## 2022-05-08 NOTE — ED Triage Notes (Signed)
Pt arrived via ems for fall Per medic pt did smell of etoh on scene Pt had mechanical fall in kitchen denies loc or hitting head pt c/o lower back and right hip pain +rom limited due to pain with no shortening or rotation

## 2022-05-08 NOTE — ED Notes (Signed)
Pt resting on stretcher blanket provided for comfort awaiting to be seen

## 2022-05-08 NOTE — ED Notes (Signed)
Pt in radiology 

## 2022-05-08 NOTE — ED Notes (Signed)
Pt medicated per mar pt resting on stretcher talking on phone NAD will continue to monitor

## 2022-05-08 NOTE — ED Notes (Signed)
Pt able to ambulate to end of hallway NAD

## 2022-05-08 NOTE — ED Notes (Signed)
Pt returned from radiology.

## 2022-05-16 ENCOUNTER — Telehealth: Payer: Self-pay

## 2022-05-16 NOTE — Telephone Encounter (Signed)
        Patient  visited [facility] on 3/4  Telephone encounter attempt :  1st  A HIPAA compliant voice message was left requesting a return call.  Instructed patient to call back.    [facility] [phone] [address], [place], [place] [address] Phone: [phone] Email: [email or web address]

## 2022-05-23 ENCOUNTER — Encounter: Payer: Medicare PPO | Admitting: Nurse Practitioner

## 2022-06-05 ENCOUNTER — Encounter: Payer: Medicare PPO | Admitting: Nurse Practitioner

## 2022-06-21 NOTE — Progress Notes (Deleted)
Patient ID: Deanna Gomez, female   DOB: [birth year], 77 y.o.   MRN: [id number]   CPE  Assessment:   CPE  1 year  Paroxysmal Atrial Fibrillation Continue medication and follow with cardiology  Atherosclerosis of aorta Per CT 2018 Control blood pressure, cholesterol, glucose, increase exercise.   Diastolic CHF Grade 2 dysfunction, Weight stable/down, appears euvolemic Followed by cardiology  Essential hypertension - continue medications, DASH diet, exercise and monitor at home. Call if greater than 130/80.  - Go to the ER if any chest pain, shortness of breath, nausea, dizziness, severe HA, changes vision/speech  - CBC  Hepatitis C virus infection without hepatic coma, unspecified chronicity S/p treatment, in remission; monitor LFTs at routine OVs  Hepatic steatosis Weight loss advised, low processed carb diet, avoid alcohol/tylenol, will monitor LFTs  Hyperlipidemia -continue medications, check lipids, decrease fatty foods, increase activity.  -lipid panel - CMP  Abnormal glucose (hx of prediabetes) Discussed general issues about diabetes pathophysiology and management., Educational material distributed., Suggested low cholesterol diet., Encouraged aerobic exercise., Discussed foot care., Reminded to get yearly retinal exam. - A1c  Morbid obesity -  Long discussion about weight loss, diet, and exercise Recommended diet heavy in fruits and veggies and low in animal meats, cheeses, and dairy products, appropriate calorie intake Patient will work on getting back on metabolism regulation diet and start working Discussed appropriate weight for height and initial goal (200lb) Follow up at next visit  Vitamin D deficiency At goal at recent check; continue to recommend supplementation for goal of 60-100 - Vitamin D level  Chronic midline back pain Hx of severe scoliosis with fusion 10+ years ago, sees a provider in [place].  Insomnia Uses xanax PRN good  sleep hygiene discussed, increase day time activity, try melatonin or benadryl   History of colon polyps Follow up was due August 2021, placed referral to Dr. Ewing Schlein per patient preference  Osteopenia Last improved, get next DEXA 12/2021, continue Vit D and Ca, weight bearing exercises  Medication Management - Magnesium  Screening for thyroid disorder - TSH  Screening for HEMATURIA/PROTEINURIA - Routine UA with reflex microscopic - Microalbumin/creatinine urine ratio        Future Appointments  Date Time Provider Department Center  06/22/2022  2:00 PM Raynelle Dick, NP [facility] None  12/20/2022 11:00 AM Adela Glimpse, NP [facility] None      Subjective:   Deanna Gomez is a 78 y.o. female who presents for CPE. She has Essential hypertension; Hyperlipidemia, mixed; Abnormal glucose; Vitamin D deficiency; History of hepatitis C; Morbid obesity; Hepatic steatosis; Abdominal aortic atherosclerosis; Diastolic CHF; Insomnia; Constipation; History of colon polyps; Osteopenia; B12 deficiency; Persistent proteinuria; Syncope; AKI (acute kidney injury); Fall at home, initial encounter; and Paroxysmal atrial fibrillation on their problem list.  She is widowed. She has 1 biological child, 4 step children, 8 grandchildren and 1 great grandchild. She is a retired Hospital doctor.   Her husband of 36 years passed 01/04/2020 due to advanced cardiac disease. Now living by herself, feels she is doing well. Did get established with grief counselor, will continue to follow. Doesn't want daily med. She has xanax 0.25-0.5 mg which she takes only if unable to fall asleep after waking up in the middle of the night or if very anxious due to storm. Typically takes 0.25 mg, 3-4 days per week.   She is being followed by Dr. Clifton James for paroxysmal atrial fibrillation.  Currently on Atenolol and Eliquis.  Significant  history include Chronic LBP from severe thoracolumbar  scoliosis s/p DDD fusion surgery x 3. Established with local provider and benefits from PT intermittently. She has continued with home exercises since covid 19.  She also underwent curative treatment for Hepatitis C (1987).   BMI is There is no height or weight on file to calculate BMI., she was going to PT but stopped with husband illness, plans to restart, stretching/strenght training at home daily, is working on diet/portions, Weight goal <200 lb.  Wt Readings from Last 3 Encounters:  05/08/22 159 lb 13.3 oz (72.5 kg)  04/30/22 195 lb (88.5 kg)  10/20/21 197 lb 9.6 oz (89.6 kg)   She has had elevated blood pressure since 1996. Her blood pressure has been controlled at home, reports somewhat labile & today their BP is    BP Readings from Last 3 Encounters:  05/08/22 104/72  04/30/22 (!) 152/118  10/20/21 110/70   . She denies dizziness, CP, dyspnea, edema.   She does workout. She denies chest pain, shortness of breath, dizziness.  She has aortic atherosclerosis per CT 2018.  She is following with cardiology after ECHO in 2019 ordered due to exertional dyspnea demonstrated grade 2 diastolic dysfunction; BP and symptoms improved recently.   She denies dyspnea on exertion, orthopnea, paroxysmal nocturnal dyspnea and edema. Positive for none.  She is on cholesterol medication (rosuvastatin 40 mg daily, admits doesn't take regularly, perhaps 2-3 days per week) and denies myalgias. Her cholesterol is not at goal. The cholesterol last visit was:  Lab Results  Component Value Date   CHOL 212 (H) 06/02/2021   HDL 98 06/02/2021   LDLCALC 93 06/02/2021   TRIG 109 06/02/2021   CHOLHDL 2.2 06/02/2021   She had prediabetes in 2012. She has been working on diet and exercise for glucose management, and denies foot ulcerations, hyperglycemia, hypoglycemia , increased appetite, nausea, paresthesia of the feet, polydipsia, polyuria, visual disturbances, vomiting and weight loss. Last A1C in the office  was:  Lab Results  Component Value Date   HGBA1C 5.2 06/02/2021   basecline CKD II, denies NSAID use, fluid intake varies, typically about 3 bottles/week.  Last GFR:  Lab Results  Component Value Date   GFRNONAA 52 (L) 05/08/2022   GFRNONAA 56 (L) 04/30/2022   GFRNONAA 55 (L) 11/07/2020   Patient is on Vitamin D supplement  Lab Results  Component Value Date   VD25OH 41 06/02/2021        Medication Review: Current Outpatient Medications on File Prior to Visit  Medication Sig   ALPRAZolam (XANAX) 0.5 MG tablet TAKE 1/2-1 TABLETS BY MOUTH TWO TIMES A DAY AS NEEDED FOR ANXIETY ATTACK OR SLEEP. LIMIT TO 5 DAYS PER WEEK TO AVOID ADDICTION AND DEMENTIA   apixaban (ELIQUIS) 5 MG TABS tablet Take 1 tablet (5 mg total) by mouth 2 (two) times daily.   atenolol (TENORMIN) 100 MG tablet Take 1 tablet (100 mg total) by mouth daily.   Cholecalciferol (VITAMIN D3) 50 MCG (2000 UT) capsule Take 2,000 Units by mouth daily.   dapagliflozin propanediol (FARXIGA) 10 MG TABS tablet Take 1 tablet by mouth daily. For kidney protection.   hydrochlorothiazide (MICROZIDE) 12.5 MG capsule Take 1 capsule (12.5 mg total) by mouth daily.   losartan (COZAAR) 50 MG tablet Take 1 tablet (50 mg total) by mouth daily.   nystatin (NYAMYC) powder APPLY DAILY AFTER A SHOWER AS DIRECTED AS NEEDED   Polyethyl Glycol-Propyl Glycol (SYSTANE) 0.4-0.3 % SOLN Apply 1 drop to  eye 3 (three) times daily as needed (dry eyes).   potassium chloride (KLOR-CON) 10 MEQ tablet Take 1 tablet (10 mEq total) by mouth daily.   rosuvastatin (CRESTOR) 40 MG tablet Take one tablet daily for cholesterol   senna (SENOKOT) 8.6 MG TABS tablet Take 1 tablet (8.6 mg total) by mouth daily as needed for mild constipation.   traZODone (DESYREL) 150 MG tablet Take 1/2 to 1 tablet   1 to 2 hours   before Bedtime as needed for Sleep (Patient not taking: Reported on 10/20/2021)   No current facility-administered medications on file prior to visit.     Current Problems (verified) Patient Active Problem List   Diagnosis Date Noted   Paroxysmal atrial fibrillation 06/02/2021   Syncope 11/04/2020   AKI (acute kidney injury) 11/04/2020   Fall at home, initial encounter 11/04/2020   Persistent proteinuria 05/07/2020   B12 deficiency 05/04/2020   History of colon polyps 07/17/2019   Osteopenia 07/17/2019   Constipation 01/06/2019   Insomnia 07/04/2018   Diastolic CHF 12/11/2017   Hepatic steatosis 12/26/2016   Abdominal aortic atherosclerosis 12/26/2016   Morbid obesity 10/15/2014   Essential hypertension 03/09/2013   Hyperlipidemia, mixed 03/09/2013   Abnormal glucose 03/09/2013   Vitamin D deficiency 03/09/2013   History of hepatitis C 03/09/2013    Screening Tests Immunization History  Administered Date(s) Administered   DTaP 03/06/2006   Influenza Split 11/20/2013, 12/24/2014   Influenza Whole 12/19/2012   Influenza, High Dose Seasonal PF 01/04/2017, 12/10/2017, 11/08/2018, 12/04/2019   PFIZER(Purple Top)SARS-COV-2 Vaccination 03/27/2019, 04/17/2019, 12/15/2019   Pneumococcal Conjugate-13 05/13/2014   Pneumococcal Polysaccharide-23 03/06/2008, 10/25/2016   Typhoid Live 03/06/2004   Zoster, Live 06/11/2007    Preventative care: Last colonoscopy: 10/2014, due 10/2019, referred to Dr. Ewing Schlein declines further [facility] 12/08/2019 [place] Declines further DEXA: 2010, 2018, 12/2019 - T -1.5 - improved from previous PAP 11/2014 DONE  Echo 2019 - grade 2 diastolic Stress test 2013  Tdap: 2008, declines Influenza: 11/2019 Pneumonia: 2018 Prevnar: 2016 Shingles: 2009, not interested in shingrix Covid 19: 3/3, 2021, pfizer - has had booster  Names of Other Physician/Practitioners you currently use: 1. [facility] here for primary care 2. Dr. Jimmey Ralph, eye doctor, last visit 2021 3. Dr. Ardelle Anton, Dentist, last visit 2021, goes q54m 4. Dr. Emily Filbert, derm, last 2021, had to reschedule - will  see this year  Patient Care Team: Lucky Cowboy, MD as PCP - General (Internal Medicine) [name], MD as PCP - Cardiology (Cardiology) Sharrell Ku, MD as Consulting Physician (Gastroenterology) Teodora Medici, MD as Consulting Physician (Gynecology) Elmon Else, MD as Consulting Physician (Dermatology) [name], DO (Ophthalmology) Rehabilitation, Julien Girt as Physical Therapist (Physical Therapy)   Allergies Allergies  Allergen Reactions   Fentanyl Other (See Comments)    Intolerlance    SURGICAL HISTORY She  has a past surgical history that includes Back surgery (1986); Breast reconstruction; and Cataract extraction. FAMILY HISTORY Her family history includes Coronary artery disease in her brother; Hyperlipidemia in her father; Obesity in her brother. SOCIAL HISTORY She  reports that she quit smoking about 17 years ago. Her smoking use included cigarettes. She has a 30.00 pack-year smoking history. She has never used smokeless tobacco. She reports current alcohol use of about 14.0 standard drinks of alcohol per week. She reports that she does not use drugs.  Review of Systems  Constitutional:  Negative for malaise/fatigue and weight loss.  HENT:  Negative for hearing loss and tinnitus.  Eyes:  Negative for blurred vision and double vision.  Respiratory:  Negative for cough, sputum production, shortness of breath and wheezing.   Cardiovascular:  Negative for chest pain, palpitations, orthopnea, claudication, leg swelling and PND.  Gastrointestinal:  Positive for constipation. Negative for abdominal pain, blood in stool, diarrhea, heartburn, melena, nausea and vomiting.  Genitourinary: Negative.   Musculoskeletal:  Negative for falls, joint pain and myalgias.  Skin:  Negative for rash.  Neurological:  Negative for dizziness, tingling, sensory change, weakness and headaches.  Endo/Heme/Allergies:  Negative for polydipsia.   Psychiatric/Behavioral: Negative.  Negative for depression, memory loss, substance abuse and suicidal ideas. The patient is not nervous/anxious and does not have insomnia.   All other systems reviewed and are negative.    Objective:     There were no vitals taken for this visit.  General Appearance: Well nourished, in no apparent distress. Eyes: PERRLA, EOMs, conjunctiva no swelling or erythema Sinuses: No Frontal/maxillary tenderness ENT/Mouth: L Ext aud canals clear, TM without erythema, bulging. R canal obstructed by hard wax. No erythema, swelling, or exudate on post pharynx.  Tonsils not swollen or erythematous. Hearing normal.  Neck: Supple, thyroid normal.  Respiratory: Respiratory effort normal, BS equal bilaterally without rales, rhonchi, wheezing or stridor.  Cardio: RRR with no MRGs. Brisk peripheral pulses without edema.  Abdomen: Soft, + BS.  Non tender, no guarding, rebound, masses. She has non-tender easily reduced ventral hernia. Lymphatics: Non tender without lymphadenopathy.  Musculoskeletal: Full ROM, 5/5 strength, Normal gait Skin: Warm, dry without rashes, lesions, ecchymosis.  Neuro: Cranial nerves intact. No cerebellar symptoms.  Psych: Awake and oriented X 3, normal affect, Insight and Judgment appropriate.  Breasts:  breasts appear normal, no suspicious masses, no skin or nipple changes or axillary nodes, excepting symmetrical erythematous rash under left breast fold GU: defer   EKG: Sinus tachycardia, atrial fibrillation  Raynelle Dick, NP   06/21/2022

## 2022-06-22 ENCOUNTER — Encounter: Payer: Medicare PPO | Admitting: Nurse Practitioner

## 2022-06-22 DIAGNOSIS — Z1389 Encounter for screening for other disorder: Secondary | ICD-10-CM

## 2022-06-22 DIAGNOSIS — K760 Fatty (change of) liver, not elsewhere classified: Secondary | ICD-10-CM

## 2022-06-22 DIAGNOSIS — Z0001 Encounter for general adult medical examination with abnormal findings: Secondary | ICD-10-CM

## 2022-06-22 DIAGNOSIS — Z79899 Other long term (current) drug therapy: Secondary | ICD-10-CM

## 2022-06-22 DIAGNOSIS — I5030 Unspecified diastolic (congestive) heart failure: Secondary | ICD-10-CM

## 2022-06-22 DIAGNOSIS — I480 Paroxysmal atrial fibrillation: Secondary | ICD-10-CM

## 2022-06-22 DIAGNOSIS — Z86010 Personal history of colonic polyps: Secondary | ICD-10-CM

## 2022-06-22 DIAGNOSIS — I10 Essential (primary) hypertension: Secondary | ICD-10-CM

## 2022-06-22 DIAGNOSIS — G4700 Insomnia, unspecified: Secondary | ICD-10-CM

## 2022-06-22 DIAGNOSIS — E559 Vitamin D deficiency, unspecified: Secondary | ICD-10-CM

## 2022-06-22 DIAGNOSIS — E782 Mixed hyperlipidemia: Secondary | ICD-10-CM

## 2022-06-22 DIAGNOSIS — G8929 Other chronic pain: Secondary | ICD-10-CM

## 2022-06-22 DIAGNOSIS — R7309 Other abnormal glucose: Secondary | ICD-10-CM

## 2022-06-22 DIAGNOSIS — Z8619 Personal history of other infectious and parasitic diseases: Secondary | ICD-10-CM

## 2022-06-22 DIAGNOSIS — Z1329 Encounter for screening for other suspected endocrine disorder: Secondary | ICD-10-CM

## 2022-06-22 DIAGNOSIS — I700 Atherosclerosis of aorta: Secondary | ICD-10-CM

## 2022-09-27 ENCOUNTER — Telehealth: Payer: Self-pay

## 2022-09-27 NOTE — Telephone Encounter (Signed)
-----   Message from [facility] sent at 09/25/2022  4:04 PM EDT ----- Regarding: Clinic Visit Please reach out to patient for a follow up visit.  Thanks

## 2022-09-27 NOTE — Telephone Encounter (Signed)
[name] spoke with the patient's daughter and she states that she won't get off of the couch,has a home caretaker. Advised that in order for her to refill her medications, she will need to be seen in our office.

## 2022-12-20 ENCOUNTER — Ambulatory Visit: Payer: Medicare PPO | Admitting: Nurse Practitioner

## 2022-12-20 DIAGNOSIS — Z0000 Encounter for general adult medical examination without abnormal findings: Secondary | ICD-10-CM

## 2022-12-28 ENCOUNTER — Telehealth: Payer: Self-pay | Admitting: Nurse Practitioner

## 2022-12-28 ENCOUNTER — Ambulatory Visit: Payer: Medicare PPO | Admitting: Nurse Practitioner

## 2022-12-28 NOTE — Telephone Encounter (Signed)
Police department called back. They were unable to get in contact with Patient. I will contact her daughter due to policy.

## 2022-12-28 NOTE — Telephone Encounter (Signed)
Okay, I will relay the message. Wanted to chart that daughter called back. Pt is safe. She was asleep during the wellness check.

## 2022-12-28 NOTE — Telephone Encounter (Signed)
Called non-emergency number to have police do a wellness check on patient due to her confirming and no showing up to the appointment.

## 2022-12-28 NOTE — Telephone Encounter (Signed)
Daughter is looking for in-home healthcare... do you have any recommendations?

## 2022-12-28 NOTE — Telephone Encounter (Signed)
I spoke with daughter to let her know of the situation. She thinks patient either did not hear the police knocking on door or she was unable to move. She is very "home bound" according to daughter. Most days she won't leave the couch. She is going to call patient. However she has a "care-taker" named Whitney who watches over her and takes care of her needs throughout the day.

## 2022-12-28 NOTE — Progress Notes (Deleted)
Patient ID: Deanna Gomez, female   DOB: 1945-09-26, 77 y.o.   MRN: [id number]   MEDICARE VISIT AND 3 MONTH FOLLOW UP  Assessment:   1. Encounter for Medicare annual wellness exam Due Annually  2. Abdominal aortic atherosclerosis (HCC) Per CT 2018 Continue Rosuvastatin Control blood pressure Cholesterol Control glucose Increase exercise.   3. Diastolic congestive heart failure, unspecified HF chronicity (HCC) Grade 2 dysfunction, Continue weight management Continue to follow with Cardiology  4. Paroxysmal atrial fibrillation (HCC) Continue Eliquis Continue Atenolol Continue HCTZ  5. AKI (acute kidney injury) (HCC) Continue Farxiga Avoid nephrotoxic agents including NSAIDS, processed meats  6. Morbid obesity (HCC) Down 14 lb in 6 mo Continue weight loss, diet, and exercise Continue diet heavy in fruits and veggies and low in animal meats, cheeses, and dairy products, appropriate calorie intake Continue to monitor.  7. Essential hypertension Continue Atenolol, HCTZ Discussed benefits of DASH diet Continue exercise  Call if greater than 130/80.   8. History of hepatitis C Treatment completed In remission; monitor LFTs at routine Ovs  9. Hepatic steatosis Continue weight loss Continue low processed and low carb diet Avoid alcohol/tylenol, Continue to monitor LFTs  10. Osteopenia, unspecified location Improved. DEXA due 12/2021,  Continue Vit D and Ca,  Discussed weight bearing exercises  11. Hyperlipidemia, mixed Continue Rosuvastatin  Decrease fatty foods Increase activity.   12. Vitamin D deficiency At goal  Continue Vitamin D  13. B12 deficiency Not currently on B12 Instructed to start supplement. Continue to monitor.  14. Medication management Compliant with all medications. All medications discussed in detail All questions and concerns addressed.  15. Encounter for screening mammogram for malignant neoplasm of breast  - MM Digital  Screening; Future  16. Colon cancer screening  - CT VIRTUAL COLONOSCOPY DIAGNOSTIC; Future  17. Osteoporosis screening  - DG Bone Density; Future  Future Appointments  Date Time Provider Department Center  12/28/2022 11:30 AM Adela Glimpse, NP [facility] None     Plan:   During the course of the visit the patient was educated and counseled about appropriate screening and preventive services including:   Pneumococcal vaccine  Influenza vaccine Td vaccine Screening electrocardiogram Bone densitometry screening Colorectal cancer screening Diabetes screening Glaucoma screening Nutrition counseling  Advanced directives: requested   Subjective:   Deanna Gomez is a 77 y.o. female who presents for AWV and 3 month follow up on htn, hyperlipidemia, weight, glucose, vitamin D. She also underwent curative treatment for Hepatitis C (1987).   Her husband of 36 years passed 01/04/2020 due to advanced cardiac disease. Now living by herself, feels she is doing well. She continues to have good support with friends.  Is trying to plan trips for vacation soon and figuring out what to do with her husbands ashes.    Significant history include Chronic LBP from severe thoracolumbar scoliosis s/p DDD fusion surgery x 3. Established with local provider and benefits from PT intermittently. Denies recent pain.   BMI is There is no height or weight on file to calculate BMI., she has lost 14 lb over the last three months.  She continues to focus on eating a well balanced diet. Wt Readings from Last 3 Encounters:  05/08/22 159 lb 13.3 oz (72.5 kg)  04/30/22 195 lb (88.5 kg)  10/20/21 197 lb 9.6 oz (89.6 kg)   She has had elevated blood pressure since 1996. Her blood pressure has been controlled at home, reports somewhat labile & today their BP is  She denies chest pain, shortness of breath, dizziness.   She has aortic atherosclerosis per CT 2018.  She takes daily statin.  She is  following with cardiology after ECHO in 2019 ordered due to exertional dyspnea demonstrated grade 2 diastolic dysfunction; BP and symptoms improved recently. She takes anticoagulant for atrial fib.  Well controlled with no new events.  She denies dyspnea on exertion, orthopnea, paroxysmal nocturnal dyspnea and edema. Positive for none.  She is on cholesterol medication (rosuvastatin 40 mg daily, was off of med last check) and denies myalgias. Her cholesterol is not at goal. The cholesterol last visit was:  Lab Results  Component Value Date   CHOL 212 (H) 06/02/2021   HDL 98 06/02/2021   LDLCALC 93 06/02/2021   TRIG 109 06/02/2021   CHOLHDL 2.2 06/02/2021   She had prediabetes in 2012. She has been working on diet and exercise for glucose management, and denies foot ulcerations, hyperglycemia, hypoglycemia , increased appetite, nausea, paresthesia of the feet, polydipsia, polyuria, visual disturbances, vomiting and weight loss. She is currently on Farxiga.  Last A1C in the office was:  Lab Results  Component Value Date   HGBA1C 5.2 06/02/2021   Basecline CKD II, denies NSAID use, fluid intake varies, had proteinuria, was referred to Dr. Thedore Mins at [facility], underwent biopsy 06/2020 which was benign, he added farxiga 10 mg to losartan 100 mg daily. Last GFR:  Lab Results  Component Value Date   GFRNONAA 52 (L) 05/08/2022   GFRNONAA 56 (L) 04/30/2022   GFRNONAA 55 (L) 11/07/2020   Patient is on Vitamin D supplement  Lab Results  Component Value Date   VD25OH 41 06/02/2021     She admits didn't start on supplement, receptive to starting Lab Results  Component Value Date   VITAMINB12 254 05/03/2020      Medication Review: Current Outpatient Medications on File Prior to Visit  Medication Sig   ALPRAZolam (XANAX) 0.5 MG tablet TAKE 1/2-1 TABLETS BY MOUTH TWO TIMES A DAY AS NEEDED FOR ANXIETY ATTACK OR SLEEP. LIMIT TO 5 DAYS PER WEEK TO AVOID ADDICTION AND DEMENTIA   apixaban  (ELIQUIS) 5 MG TABS tablet Take 1 tablet (5 mg total) by mouth 2 (two) times daily.   atenolol (TENORMIN) 100 MG tablet Take 1 tablet (100 mg total) by mouth daily.   Cholecalciferol (VITAMIN D3) 50 MCG (2000 UT) capsule Take 2,000 Units by mouth daily.   dapagliflozin propanediol (FARXIGA) 10 MG TABS tablet Take 1 tablet by mouth daily. For kidney protection.   hydrochlorothiazide (MICROZIDE) 12.5 MG capsule Take 1 capsule (12.5 mg total) by mouth daily.   losartan (COZAAR) 50 MG tablet Take 1 tablet (50 mg total) by mouth daily.   nystatin (NYAMYC) powder APPLY DAILY AFTER A SHOWER AS DIRECTED AS NEEDED   Polyethyl Glycol-Propyl Glycol (SYSTANE) 0.4-0.3 % SOLN Apply 1 drop to eye 3 (three) times daily as needed (dry eyes).   potassium chloride (KLOR-CON) 10 MEQ tablet Take 1 tablet (10 mEq total) by mouth daily.   rosuvastatin (CRESTOR) 40 MG tablet Take one tablet daily for cholesterol   senna (SENOKOT) 8.6 MG TABS tablet Take 1 tablet (8.6 mg total) by mouth daily as needed for mild constipation.   traZODone (DESYREL) 150 MG tablet Take 1/2 to 1 tablet   1 to 2 hours   before Bedtime as needed for Sleep (Patient not taking: Reported on 10/20/2021)   No current facility-administered medications on file prior to visit.  Current Problems (verified) Patient Active Problem List   Diagnosis Date Noted   Paroxysmal atrial fibrillation (HCC) 06/02/2021   Syncope 11/04/2020   AKI (acute kidney injury) (HCC) 11/04/2020   Fall at home, initial encounter 11/04/2020   Persistent proteinuria 05/07/2020   B12 deficiency 05/04/2020   History of colon polyps 07/17/2019   Osteopenia 07/17/2019   Constipation 01/06/2019   Insomnia 07/04/2018   Diastolic CHF (HCC) 12/11/2017   Hepatic steatosis 12/26/2016   Abdominal aortic atherosclerosis (HCC) 12/26/2016   Morbid obesity (HCC) 10/15/2014   Essential hypertension 03/09/2013   Hyperlipidemia, mixed 03/09/2013   Abnormal glucose 03/09/2013    Vitamin D deficiency 03/09/2013   History of hepatitis C 03/09/2013    Screening Tests Immunization History  Administered Date(s) Administered   DTaP 03/06/2006   Influenza Split 11/20/2013, 12/24/2014   Influenza Whole 12/19/2012   Influenza, High Dose Seasonal PF 01/04/2017, 12/10/2017, 11/08/2018, 12/04/2019   PFIZER(Purple Top)SARS-COV-2 Vaccination 03/27/2019, 04/17/2019, 12/15/2019   Pneumococcal Conjugate-13 05/13/2014   Pneumococcal Polysaccharide-23 03/06/2008, 10/25/2016   Typhoid Live 03/06/2004   Zoster, Live 06/11/2007    Preventative care: Last colonoscopy: 10/2014, due 10/2019, Due. She will reach out to GI when ready. MGM:  Due DEXA: 2010, 2018, 10/2021Due PAP 11/2014 DONE  Echo 2019 - grade 2 diastolic Stress test 2013  Tdap: 2008, declines Influenza: 11/2021 Pneumonia: 2018 Prevnar: 2016 Shingles: 2009, not interested in shingrix Covid 19: 3/3, 2021, pfizer   Names of Other Physician/Practitioners you currently use: 1. [facility] here for primary care 2. Dr. Hazle Quant, eye doctor, last visit 2022 3. Dr. Clide Dales, Dentist, last visit 2022, goes q49m 4. Dr. Emily Filbert, derm, last 2021, had to reschedule - will see this year  Patient Care Team: Lucky Cowboy, MD as PCP - General (Internal Medicine) [name], MD as PCP - Cardiology (Cardiology) Sharrell Ku, MD as Consulting Physician (Gastroenterology) Teodora Medici, MD as Consulting Physician (Gynecology) Elmon Else, MD as Consulting Physician (Dermatology) [name], DO (Ophthalmology) Rehabilitation, Julien Girt as Physical Therapist (Physical Therapy)   Allergies Allergies  Allergen Reactions   Fentanyl Other (See Comments)    Intolerlance    SURGICAL HISTORY She  has a past surgical history that includes Back surgery (1986); Breast reconstruction; and Cataract extraction. FAMILY HISTORY Her family history includes Coronary  artery disease in her brother; Hyperlipidemia in her father; Obesity in her brother. SOCIAL HISTORY She  reports that she quit smoking about 18 years ago. Her smoking use included cigarettes. She started smoking about 48 years ago. She has a 30 pack-year smoking history. She has never used smokeless tobacco. She reports current alcohol use of about 14.0 standard drinks of alcohol per week. She reports that she does not use drugs.  MEDICARE WELLNESS OBJECTIVES: Physical activity:   Cardiac risk factors:   Depression/mood screen:      07/24/2021   10:00 PM  Depression screen PHQ 2/9  Decreased Interest 1  Down, Depressed, Hopeless 1  PHQ - 2 Score 2    ADLs:      No data to display           Cognitive Testing  Alert? Yes  Normal Appearance?Yes  Oriented to person? Yes  Place? Yes   Time? Yes  Recall of three objects?  Yes  Can perform simple calculations? Yes  Displays appropriate judgment?Yes  Can read the correct time from a watch face?Yes  EOL planning:        Review of Systems  Constitutional:  Negative for malaise/fatigue and weight loss.  HENT:  Negative for hearing loss and tinnitus.   Eyes:  Negative for blurred vision and double vision.  Respiratory:  Negative for cough, sputum production, shortness of breath and wheezing.   Cardiovascular:  Negative for chest pain, palpitations, orthopnea, claudication, leg swelling and PND.  Gastrointestinal:  Negative for abdominal pain, blood in stool, constipation, diarrhea, heartburn, melena, nausea and vomiting.  Genitourinary: Negative.   Musculoskeletal:  Negative for falls, joint pain and myalgias.  Skin:  Negative for rash.  Neurological:  Negative for dizziness, tingling, sensory change, weakness and headaches.  Endo/Heme/Allergies:  Negative for polydipsia.  Psychiatric/Behavioral:  Negative for depression, memory loss, substance abuse and suicidal ideas. The patient is nervous/anxious (with storms) and has  insomnia (occasional).   All other systems reviewed and are negative.    Objective:     There were no vitals taken for this visit.  General Appearance: Well nourished, in no apparent distress. Eyes: PERRLA, EOMs, conjunctiva no swelling or erythema Sinuses: No Frontal/maxillary tenderness ENT/Mouth: L Ext aud canals clear, TM without erythema, bulging. No erythema, swelling, or exudate on post pharynx.  Tonsils not swollen or erythematous. Hearing normal.  Neck: Supple, thyroid normal.  Respiratory: Respiratory effort normal, BS equal bilaterally without rales, rhonchi, wheezing or stridor.  Cardio: RRR with no MRGs. Brisk peripheral pulses without edema.  Abdomen: Soft, + BS.  Non tender, no guarding, rebound, masses. She has non-tender easily reduced ventral hernia. Lymphatics: Non tender without lymphadenopathy.  Musculoskeletal: Full ROM, 5/5 strength, Normal gait Skin: Warm, dry without rashes, lesions, ecchymosis.  Neuro: Cranial nerves intact. No cerebellar symptoms.  Psych: Awake and oriented X 3, normal affect, Insight and Judgment appropriate.    Medicare Attestation I have personally reviewed: The patient's medical and social history Their use of alcohol, tobacco or illicit drugs Their current medications and supplements The patient's functional ability including ADLs,fall risks, home safety risks, cognitive, and hearing and visual impairment Diet and physical activities Evidence for depression or mood disorders  The patient's weight, height, BMI, and visual acuity have been recorded in the chart.  I have made referrals, counseling, and provided education to the patient based on review of the above and I have provided the patient with a written personalized care plan for preventive services.    Adela Glimpse, NP   12/28/2022

## 2023-02-04 DEATH — deceased

## 2023-06-25 ENCOUNTER — Encounter: Payer: Medicare PPO | Admitting: Nurse Practitioner

## 2023-12-20 ENCOUNTER — Ambulatory Visit: Payer: Medicare PPO | Admitting: Nurse Practitioner
